# Patient Record
Sex: Female | Born: 1940
Health system: Southern US, Community
[De-identification: ages and names within clinical notes are randomized; demographics above are authoritative.]

## PROBLEM LIST (undated history)

## (undated) DIAGNOSIS — E039 Hypothyroidism, unspecified: Secondary | ICD-10-CM

## (undated) DIAGNOSIS — F32A Depression, unspecified: Secondary | ICD-10-CM

## (undated) DIAGNOSIS — Z8709 Personal history of other diseases of the respiratory system: Secondary | ICD-10-CM

## (undated) DIAGNOSIS — F329 Major depressive disorder, single episode, unspecified: Secondary | ICD-10-CM

## (undated) DIAGNOSIS — M2540 Effusion, unspecified joint: Secondary | ICD-10-CM

## (undated) DIAGNOSIS — Z8669 Personal history of other diseases of the nervous system and sense organs: Secondary | ICD-10-CM

## (undated) DIAGNOSIS — M2550 Pain in unspecified joint: Secondary | ICD-10-CM

## (undated) DIAGNOSIS — Z86718 Personal history of other venous thrombosis and embolism: Secondary | ICD-10-CM

## (undated) DIAGNOSIS — Z860100 Personal history of colon polyps, unspecified: Secondary | ICD-10-CM

## (undated) DIAGNOSIS — T7840XA Allergy, unspecified, initial encounter: Secondary | ICD-10-CM

## (undated) DIAGNOSIS — R112 Nausea with vomiting, unspecified: Secondary | ICD-10-CM

## (undated) DIAGNOSIS — M1990 Unspecified osteoarthritis, unspecified site: Secondary | ICD-10-CM

## (undated) DIAGNOSIS — T4145XA Adverse effect of unspecified anesthetic, initial encounter: Secondary | ICD-10-CM

## (undated) DIAGNOSIS — E559 Vitamin D deficiency, unspecified: Secondary | ICD-10-CM

## (undated) DIAGNOSIS — K5900 Constipation, unspecified: Secondary | ICD-10-CM

## (undated) DIAGNOSIS — K219 Gastro-esophageal reflux disease without esophagitis: Secondary | ICD-10-CM

## (undated) DIAGNOSIS — R32 Unspecified urinary incontinence: Secondary | ICD-10-CM

## (undated) DIAGNOSIS — T8859XA Other complications of anesthesia, initial encounter: Secondary | ICD-10-CM

## (undated) DIAGNOSIS — Z8489 Family history of other specified conditions: Secondary | ICD-10-CM

## (undated) DIAGNOSIS — I00 Rheumatic fever without heart involvement: Secondary | ICD-10-CM

## (undated) DIAGNOSIS — Z9889 Other specified postprocedural states: Secondary | ICD-10-CM

## (undated) HISTORY — DX: Personal history of colon polyps, unspecified: Z86.0100

## (undated) HISTORY — PX: OTHER SURGICAL HISTORY: SHX169

## (undated) HISTORY — DX: Personal history of colonic polyps: Z86.010

## (undated) HISTORY — DX: Rheumatic fever without heart involvement: I00

## (undated) HISTORY — DX: Hypothyroidism, unspecified: E03.9

## (undated) HISTORY — PX: APPENDECTOMY: SHX54

---
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
## 1977-08-04 DIAGNOSIS — Z86718 Personal history of other venous thrombosis and embolism: Secondary | ICD-10-CM

## 1977-08-04 HISTORY — DX: Personal history of other venous thrombosis and embolism: Z86.718

## 1980-08-04 HISTORY — PX: CHOLECYSTECTOMY: SHX55

## 1984-08-04 HISTORY — PX: BACK SURGERY: SHX140

## 1997-08-04 HISTORY — PX: KNEE SURGERY: SHX244

## 2001-08-02 ENCOUNTER — Encounter: Payer: Self-pay | Admitting: Family Medicine

## 2001-08-02 ENCOUNTER — Ambulatory Visit (HOSPITAL_COMMUNITY): Admission: RE | Admit: 2001-08-02 | Discharge: 2001-08-02 | Payer: Self-pay | Admitting: Family Medicine

## 2001-08-02 ENCOUNTER — Other Ambulatory Visit: Admission: RE | Admit: 2001-08-02 | Discharge: 2001-08-02 | Payer: Self-pay | Admitting: Family Medicine

## 2002-01-20 ENCOUNTER — Encounter: Payer: Self-pay | Admitting: Family Medicine

## 2002-01-20 ENCOUNTER — Ambulatory Visit (HOSPITAL_COMMUNITY): Admission: RE | Admit: 2002-01-20 | Discharge: 2002-01-20 | Payer: Self-pay | Admitting: Family Medicine

## 2002-02-02 ENCOUNTER — Ambulatory Visit (HOSPITAL_COMMUNITY): Admission: RE | Admit: 2002-02-02 | Discharge: 2002-02-02 | Payer: Self-pay | Admitting: Cardiology

## 2002-02-02 ENCOUNTER — Encounter: Payer: Self-pay | Admitting: Cardiovascular Disease

## 2002-02-02 ENCOUNTER — Ambulatory Visit (HOSPITAL_COMMUNITY): Admission: RE | Admit: 2002-02-02 | Discharge: 2002-02-02 | Payer: Self-pay | Admitting: Cardiovascular Disease

## 2002-03-06 ENCOUNTER — Ambulatory Visit (HOSPITAL_COMMUNITY): Admission: RE | Admit: 2002-03-06 | Discharge: 2002-03-06 | Payer: Self-pay | Admitting: Family Medicine

## 2002-03-06 ENCOUNTER — Encounter: Payer: Self-pay | Admitting: Family Medicine

## 2002-08-04 HISTORY — PX: COLONOSCOPY: SHX174

## 2002-08-09 ENCOUNTER — Ambulatory Visit (HOSPITAL_COMMUNITY): Admission: RE | Admit: 2002-08-09 | Discharge: 2002-08-09 | Payer: Self-pay | Admitting: Family Medicine

## 2002-08-09 ENCOUNTER — Encounter: Payer: Self-pay | Admitting: Family Medicine

## 2002-10-25 ENCOUNTER — Ambulatory Visit (HOSPITAL_COMMUNITY): Admission: RE | Admit: 2002-10-25 | Discharge: 2002-10-25 | Payer: Self-pay | Admitting: Internal Medicine

## 2003-08-14 ENCOUNTER — Ambulatory Visit (HOSPITAL_COMMUNITY): Admission: RE | Admit: 2003-08-14 | Discharge: 2003-08-14 | Payer: Self-pay | Admitting: Family Medicine

## 2006-11-18 ENCOUNTER — Ambulatory Visit (HOSPITAL_COMMUNITY): Admission: RE | Admit: 2006-11-18 | Discharge: 2006-11-18 | Payer: Self-pay | Admitting: Family Medicine

## 2008-05-18 ENCOUNTER — Ambulatory Visit (HOSPITAL_COMMUNITY): Admission: RE | Admit: 2008-05-18 | Discharge: 2008-05-18 | Payer: Self-pay | Admitting: Internal Medicine

## 2008-11-16 ENCOUNTER — Ambulatory Visit (HOSPITAL_COMMUNITY): Admission: RE | Admit: 2008-11-16 | Discharge: 2008-11-16 | Payer: Self-pay | Admitting: Internal Medicine

## 2008-11-23 ENCOUNTER — Ambulatory Visit (HOSPITAL_COMMUNITY): Admission: RE | Admit: 2008-11-23 | Discharge: 2008-11-23 | Payer: Self-pay | Admitting: Internal Medicine

## 2009-09-27 ENCOUNTER — Ambulatory Visit (HOSPITAL_COMMUNITY): Admission: RE | Admit: 2009-09-27 | Discharge: 2009-09-27 | Payer: Self-pay | Admitting: Orthopaedic Surgery

## 2009-10-02 HISTORY — PX: TOTAL KNEE ARTHROPLASTY: SHX125

## 2009-10-26 ENCOUNTER — Inpatient Hospital Stay (HOSPITAL_COMMUNITY): Admission: RE | Admit: 2009-10-26 | Discharge: 2009-10-30 | Payer: Self-pay | Admitting: Orthopaedic Surgery

## 2010-10-18 ENCOUNTER — Other Ambulatory Visit (HOSPITAL_COMMUNITY): Payer: Self-pay | Admitting: Internal Medicine

## 2010-10-18 DIAGNOSIS — Z139 Encounter for screening, unspecified: Secondary | ICD-10-CM

## 2010-10-24 ENCOUNTER — Ambulatory Visit (HOSPITAL_COMMUNITY)
Admission: RE | Admit: 2010-10-24 | Discharge: 2010-10-24 | Disposition: A | Discharge status: Home / Self Care | Payer: Medicare Other | Source: Ambulatory Visit | Attending: Internal Medicine | Admitting: Internal Medicine

## 2010-10-24 DIAGNOSIS — Z1231 Encounter for screening mammogram for malignant neoplasm of breast: Secondary | ICD-10-CM | POA: Insufficient documentation

## 2010-10-24 DIAGNOSIS — Z139 Encounter for screening, unspecified: Secondary | ICD-10-CM

## 2010-10-24 LAB — URINALYSIS, ROUTINE W REFLEX MICROSCOPIC
Bilirubin Urine: NEGATIVE
Glucose, UA: NEGATIVE mg/dL
Hgb urine dipstick: NEGATIVE
Ketones, ur: NEGATIVE mg/dL
Protein, ur: NEGATIVE mg/dL
Urobilinogen, UA: 0.2 mg/dL (ref 0.0–1.0)
pH: 5.5 (ref 5.0–8.0)

## 2010-10-24 LAB — CBC
MCHC: 32.8 g/dL (ref 30.0–36.0)
MCV: 97 fL (ref 78.0–100.0)
Platelets: 206 10*3/uL (ref 150–400)
RDW: 13.2 % (ref 11.5–15.5)

## 2010-10-24 LAB — PROTIME-INR
INR: 1.01 (ref 0.00–1.49)
Prothrombin Time: 13.2 seconds (ref 11.6–15.2)

## 2010-10-24 LAB — BASIC METABOLIC PANEL
CO2: 30 mEq/L (ref 19–32)
Creatinine, Ser: 0.93 mg/dL (ref 0.4–1.2)
GFR calc non Af Amer: 60 mL/min — ABNORMAL LOW (ref >60)
Potassium: 4.4 mEq/L (ref 3.5–5.1)
Sodium: 142 mEq/L (ref 135–145)

## 2010-10-28 LAB — CBC
Hemoglobin: 14.2 g/dL (ref 12.0–15.0)
Hemoglobin: 9.8 g/dL — ABNORMAL LOW (ref 12.0–15.0)
MCHC: 33.3 g/dL (ref 30.0–36.0)
MCHC: 33.6 g/dL (ref 30.0–36.0)
MCHC: 34.3 g/dL (ref 30.0–36.0)
MCHC: 34.7 g/dL (ref 30.0–36.0)
MCV: 96 fL (ref 78.0–100.0)
MCV: 96.4 fL (ref 78.0–100.0)
Platelets: 162 10*3/uL (ref 150–400)
Platelets: 173 10*3/uL (ref 150–400)
RBC: 2.94 MIL/uL — ABNORMAL LOW (ref 3.87–5.11)
RBC: 3.63 MIL/uL — ABNORMAL LOW (ref 3.87–5.11)
RDW: 12.6 % (ref 11.5–15.5)
RDW: 13 % (ref 11.5–15.5)
WBC: 7.6 10*3/uL (ref 4.0–10.5)
WBC: 8.4 10*3/uL (ref 4.0–10.5)
WBC: 8.8 10*3/uL (ref 4.0–10.5)

## 2010-10-28 LAB — BASIC METABOLIC PANEL
BUN: 15 mg/dL (ref 6–23)
BUN: 20 mg/dL (ref 6–23)
BUN: 23 mg/dL (ref 6–23)
CO2: 30 mEq/L (ref 19–32)
CO2: 30 mEq/L (ref 19–32)
CO2: 30 mEq/L (ref 19–32)
CO2: 32 mEq/L (ref 19–32)
Calcium: 8.5 mg/dL (ref 8.4–10.5)
Calcium: 8.7 mg/dL (ref 8.4–10.5)
Chloride: 100 mEq/L (ref 96–112)
Chloride: 104 mEq/L (ref 96–112)
Chloride: 95 mEq/L — ABNORMAL LOW (ref 96–112)
Creatinine, Ser: 0.87 mg/dL (ref 0.4–1.2)
Creatinine, Ser: 1.37 mg/dL — ABNORMAL HIGH (ref 0.4–1.2)
GFR calc Af Amer: 46 mL/min — ABNORMAL LOW (ref >60)
GFR calc Af Amer: >60 mL/min (ref >60)
GFR calc Af Amer: >60 mL/min (ref >60)
GFR calc non Af Amer: 38 mL/min — ABNORMAL LOW (ref >60)
GFR calc non Af Amer: >60 mL/min (ref >60)
Glucose, Bld: 136 mg/dL — ABNORMAL HIGH (ref 70–99)
Potassium: 3.3 mEq/L — ABNORMAL LOW (ref 3.5–5.1)
Potassium: 3.3 mEq/L — ABNORMAL LOW (ref 3.5–5.1)
Sodium: 134 mEq/L — ABNORMAL LOW (ref 135–145)
Sodium: 137 mEq/L (ref 135–145)
Sodium: 138 mEq/L (ref 135–145)

## 2010-10-28 LAB — PROTIME-INR
INR: 1.02 (ref 0.00–1.49)
INR: 1.46 (ref 0.00–1.49)
Prothrombin Time: 13.3 seconds (ref 11.6–15.2)
Prothrombin Time: 14.1 seconds (ref 11.6–15.2)
Prothrombin Time: 16.2 seconds — ABNORMAL HIGH (ref 11.6–15.2)

## 2010-10-28 LAB — URINALYSIS, ROUTINE W REFLEX MICROSCOPIC
Glucose, UA: NEGATIVE mg/dL
Nitrite: NEGATIVE
Urobilinogen, UA: 0.2 mg/dL (ref 0.0–1.0)

## 2010-12-20 NOTE — Procedures (Signed)
[facility]  Patient:    [name], [name] Visit Number: [id number] MRN: [id number]          Service Type: OUT Location: RAD Attending Physician:  Burnetta Sabin Dictated by:   Jacolyn Reedy, P.A.-C. Proc. Date: 02/02/02 Admit Date:  02/02/2002                                Stress Test  PROCEDURE:  Stress adenosine Cardiolite procedure.  PHYSICIAN:  Valera Castle, M.D.  DESCRIPTION OF PROCEDURE:  The baseline electrocardiogram revealed a normal sinus rhythm.  The patient had shortness of breath and tightness in her throat with the adenosine infusion.  The test was stopped due to completing the protocol, and her symptoms resolved quickly in recovery.  She had no electrocardiogram changes or arrhythmias.  The test was performed, because she atypical chest pain and a strong family history of coronary artery disease. Dictated by:   Jacolyn Reedy, P.A.-C. Attending Physician:  Burnetta Sabin DD:  02/02/02 TD:  02/04/02 Job: [id number]

## 2010-12-20 NOTE — Op Note (Signed)
[name], [name]                        ACCOUNT NO.:  [id number]   MEDICAL RECORD NO.:  [id number]                   PATIENT TYPE:  AMB   LOCATION:  DAY                                  FACILITY:  [facility]   PHYSICIAN:  [name], M.D.              DATE OF BIRTH:  16-Jun-1941   DATE OF PROCEDURE:  DATE OF DISCHARGE:                                 OPERATIVE REPORT   PROCEDURE:  Colonoscopy with snare polypectomy.   INDICATIONS FOR PROCEDURE:  The patient is a 70 year old lady referred here  by Dr. Butch Hansen for colorectal cancer screening.  She is devoid of any  lower GI tract symptoms and no family history of colorectal neoplasia.  She  has never had her colon imaged previously.  Colonoscopy is now being done as  a standard screening maneuver.  This approach has been discussed with the  patient at length.  The potential risks, benefits, and alternatives have  been reviewed and questions answered.  Please see my handwritten H&P for  more information.   PROCEDURE:  O2 saturation, blood pressure, pulses, and respirations were  monitored throughout the entire procedure.  Conscious sedation was with  Demerol 75 mg IV, Versed 3 mg IV in divided doses.  The instrument used was  the Olympus video chip adult colonoscope.   FINDINGS:  Digital rectal examination revealed no abnormalities.   ENDOSCOPIC FINDINGS:  The prep was good.   Rectum:  Examination of the rectal mucosa including retroflex view of the  anal verge revealed a 3.75-cm polyp and at 18 cm an adjacent 3-mm polyp.  No  other mucosal abnormalities were seen in the rectum.   Colon:  The colonic mucosa was surveyed from the rectosigmoid junction  through the left, transverse, right colon to the area of the appendiceal  orifice, ileocecal valve, and cecum.  These structures were well-seen and  photographed for the record.  The patient was noted to have left-sided  transverse diverticula.  The remainder of the  colonic mucosa to the cecum  appeared normal.  From the level of the cecum and appendiceal orifice, the  scope was slowly withdrawn.  All previously mentioned mucosal surfaces were  again seen, and no other abnormalities were observed.  The larger polyp in  the rectum was removed with snare cautery and recovered through the scope.  The smaller polyp was destroyed with the tip of the snare unit.  The patient  tolerated the procedure well and was reactive in endoscopy.   IMPRESSION:  1. Rectal polyps treated as described above.  The remainder of the rectal     mucosa appeared normal.  2. Left-sided transverse diverticula.  The remainder of the colonic mucosa     appeared normal.    RECOMMENDATIONS:  1. Diverticulosis literature provided to the patient.  2. No aspirin or arthritis medications for 10 days.  3. Further recommendations to follow pending  review of pathology report.                                               [name], M.D.    RMR/MEDQ  D:  10/25/2002  T:  10/25/2002  Job:  [id number]   cc:   [name], M.D.  [address]  [place]  [place] [address]  Fax: [phone]

## 2011-01-20 ENCOUNTER — Other Ambulatory Visit (INDEPENDENT_AMBULATORY_CARE_PROVIDER_SITE_OTHER): Payer: Medicare Other | Admitting: Internal Medicine

## 2011-01-20 ENCOUNTER — Encounter: Payer: Self-pay | Admitting: Gastroenterology

## 2011-01-20 ENCOUNTER — Ambulatory Visit (INDEPENDENT_AMBULATORY_CARE_PROVIDER_SITE_OTHER): Payer: Medicare Other | Admitting: Gastroenterology

## 2011-01-20 DIAGNOSIS — K649 Unspecified hemorrhoids: Secondary | ICD-10-CM | POA: Insufficient documentation

## 2011-01-20 DIAGNOSIS — K219 Gastro-esophageal reflux disease without esophagitis: Secondary | ICD-10-CM

## 2011-01-20 DIAGNOSIS — D126 Benign neoplasm of colon, unspecified: Secondary | ICD-10-CM

## 2011-01-20 DIAGNOSIS — K635 Polyp of colon: Secondary | ICD-10-CM | POA: Insufficient documentation

## 2011-01-20 MED ORDER — PEG 3350-KCL-NA BICARB-NACL 420 G PO SOLR: ORAL | Status: AC

## 2011-01-20 MED ORDER — HYDROCORTISONE ACETATE 25 MG RE SUPP: 25.0000 mg | Freq: Two times a day (BID) | RECTAL | Status: AC

## 2011-01-20 NOTE — Progress Notes (Signed)
Primary Care Physician:  Dwana Melena, MD  Primary Gastroenterologist:  Roetta Sessions, MD  Chief Complaint  Patient presents with  . Colonoscopy    HPI:  [name] is a 70 y.o. female here to schedule surveillance colonoscopy for history of colonic polyps. She also has chronic GERD and hemorrhoids. Since knee surgery, bothered with hemorrhoids. She got significantly constipated postoperatively. Bleed sometimes, itching and wipes. BM more regular now on stool softner every night. No heartburn on medication. No dyphagia. No abdominal pain. No weight loss.  Labs from February 2012, sodium 143, potassium 4.8, CO2 29, glucose 105, BUN 23, creatinine 0.9, total bilirubin 0.6, alkaline phosphatase 107, AST 18, ALT 19, albumin 4.6, calcium 9.9, TSH 3.408, free T4 1.3 to   Current Outpatient Prescriptions  Medication Sig Dispense Refill  . aspirin 81 MG tablet Take 81 mg by mouth daily.        . Cholecalciferol (VITAMIN D) 2000 UNITS CAPS Take by mouth.        . Cyanocobalamin (VITAMIN B-12) 2500 MCG SUBL Place under the tongue.        . diclofenac (VOLTAREN) 75 MG EC tablet       . DIOVAN HCT 160-12.5 MG per tablet       . escitalopram (LEXAPRO) 20 MG tablet Take 20 mg by mouth daily.        Marland Kitchen levothyroxine (SYNTHROID, LEVOTHROID) 25 MCG tablet       . olopatadine (PATANOL) 0.1 % ophthalmic solution 1 drop 2 (two) times daily.        Marland Kitchen omeprazole (PRILOSEC) 20 MG capsule       . simvastatin (ZOCOR) 40 MG tablet       . vitamin C (ASCORBIC ACID) 500 MG tablet Take 500 mg by mouth daily.        . hydrocortisone (ANUSOL-HC) 25 MG suppository Place 1 suppository (25 mg total) rectally every 12 (twelve) hours.  20 suppository  0  . polyethylene glycol-electrolytes (TRILYTE) 420 G solution Use as directed Also buy 1 fleet enema & 4 dulcolax tablets to use as directed  4000 mL  0    Allergies as of 01/20/2011  . (No Known Allergies)    Past Medical History  Diagnosis Date  . HTN  (hypertension)   . Anxiety   . Personal history of colonic polyps   . Hypothyroidism   . Hyperlipidemia   . Rheumatic fever 1958    hospitalized for three months    Past Surgical History  Procedure Date  . Cesarean section 1979  . Back surgery 1986  . Cholecystectomy 1982  . Knee surgery 1999    left  . Total knee arthroplasty 10/2009    left  . Roster shots 2007    ?  Marland Kitchen Colonoscopy 2004    Dr. Frankey Poot polyps, left sided diverticula. Path unavailable.   Marland Kitchen Appendectomy     incidental at time of C-section    Family History  Problem Relation Age of Onset  . Colon cancer Neg Hx   . Diabetes      siblings, mother  . Lung disease Father     black lung, died young  . Heart disease Brother     History   Social History  . Marital Status: Single    Spouse Name: N/A    Number of Children: 1  . Years of Education: N/A   Occupational History  .     Social History Main Topics  . Smoking status:  Never Smoker   . Smokeless tobacco: Not on file  . Alcohol Use: No  . Drug Use: No  . Sexually Active: Not on file   Other Topics Concern  . Not on file   Social History Narrative  . No narrative on file      ROS:  General: Negative for anorexia, weight loss, fever, chills, fatigue, weakness. Eyes: Negative for vision changes.  ENT: Negative for hoarseness, difficulty swallowing , nasal congestion. CV: Negative for chest pain, angina, palpitations, dyspnea on exertion, peripheral edema.  Respiratory: Negative for dyspnea at rest, dyspnea on exertion, cough, sputum, wheezing.  GI: See history of present illness. GU:  Negative for dysuria, hematuria, urinary incontinence, urinary frequency, nocturnal urination.  MS: Negative for joint pain, low back pain.  Derm: Negative for rash or itching.  Neuro: Negative for weakness, abnormal sensation, seizure, frequent headaches, memory loss, confusion.  Psych: Negative for anxiety, depression, suicidal ideation,  hallucinations.  Endo: Negative for unusual weight change.  Heme: Negative for bruising or bleeding. Allergy: Negative for rash or hives.    Physical Examination:  BP 120/76  Pulse 74  Temp(Src) 97.3 F (36.3 C) (Temporal)  Ht 5\' 8"  (1.727 m)  Wt 255 lb 3.2 oz (115.758 kg)  BMI 38.80 kg/m2   General: Well-nourished, well-developed in no acute distress.  Head: Normocephalic, atraumatic.   Eyes: Conjunctiva pink, no icterus. Mouth: Oropharyngeal mucosa moist and pink , no lesions erythema or exudate. Neck: Supple without thyromegaly, masses, or lymphadenopathy.  Lungs: Clear to auscultation bilaterally.  Heart: Regular rate and rhythm, no murmurs rubs or gallops.  Abdomen: Bowel sounds are normal, nontender, nondistended, no hepatosplenomegaly or masses, no abdominal bruits or    hernia , no rebound or guarding.   Extremities: No lower extremity edema.  Neuro: Alert and oriented x 4 , grossly normal neurologically.  Skin: Warm and dry, no rash or jaundice.   Psych: Alert and cooperative, normal mood and affect.

## 2011-01-21 ENCOUNTER — Encounter: Payer: Self-pay | Admitting: Gastroenterology

## 2011-01-21 NOTE — Progress Notes (Signed)
Cc to PCP 

## 2011-01-21 NOTE — Assessment & Plan Note (Signed)
Chronic GERD. Symptoms controlled on PPI. No prior endoscopy. Recommend one time EGD to rule out Barrett's. I have discussed the risks, alternatives, benefits with regards to but not limited to the risk of reaction to medication, bleeding, infection, perforation and the patient is agreeable to proceed. Written consent to be obtained.

## 2011-01-21 NOTE — Assessment & Plan Note (Signed)
History of colonic polyps, pathology unavailable at this time. She states she was supposed to come back in 5 years for followup. Colonoscopy for surveillance.  I have discussed the risks, alternatives, benefits with regards to but not limited to the risk of reaction to medication, bleeding, infection, perforation and the patient is agreeable to proceed. Written consent to be obtained.

## 2011-02-11 MED ORDER — SODIUM CHLORIDE 0.45 % IV SOLN: Freq: Once | INTRAVENOUS | Status: DC

## 2011-02-12 ENCOUNTER — Other Ambulatory Visit: Payer: Self-pay | Admitting: Internal Medicine

## 2011-02-12 ENCOUNTER — Encounter: Payer: Medicare Other | Admitting: Internal Medicine

## 2011-02-12 ENCOUNTER — Encounter (HOSPITAL_COMMUNITY): Payer: Self-pay | Admitting: *Deleted

## 2011-02-12 ENCOUNTER — Encounter (HOSPITAL_COMMUNITY)
Admission: RE | Disposition: A | Discharge status: Home / Self Care | Payer: Self-pay | Source: Ambulatory Visit | Attending: Internal Medicine

## 2011-02-12 ENCOUNTER — Ambulatory Visit (HOSPITAL_COMMUNITY)
Admission: RE | Admit: 2011-02-12 | Discharge: 2011-02-12 | Disposition: A | Discharge status: Home / Self Care | Payer: Medicare Other | Source: Ambulatory Visit | Attending: Internal Medicine | Admitting: Internal Medicine

## 2011-02-12 DIAGNOSIS — K5730 Diverticulosis of large intestine without perforation or abscess without bleeding: Secondary | ICD-10-CM | POA: Insufficient documentation

## 2011-02-12 DIAGNOSIS — I10 Essential (primary) hypertension: Secondary | ICD-10-CM | POA: Insufficient documentation

## 2011-02-12 DIAGNOSIS — Z860100 Personal history of colon polyps, unspecified: Secondary | ICD-10-CM | POA: Insufficient documentation

## 2011-02-12 DIAGNOSIS — R933 Abnormal findings on diagnostic imaging of other parts of digestive tract: Secondary | ICD-10-CM

## 2011-02-12 DIAGNOSIS — Z800 Family history of malignant neoplasm of digestive organs: Secondary | ICD-10-CM | POA: Insufficient documentation

## 2011-02-12 DIAGNOSIS — K219 Gastro-esophageal reflux disease without esophagitis: Secondary | ICD-10-CM | POA: Insufficient documentation

## 2011-02-12 DIAGNOSIS — Z79899 Other long term (current) drug therapy: Secondary | ICD-10-CM | POA: Insufficient documentation

## 2011-02-12 DIAGNOSIS — Z7982 Long term (current) use of aspirin: Secondary | ICD-10-CM | POA: Insufficient documentation

## 2011-02-12 DIAGNOSIS — E785 Hyperlipidemia, unspecified: Secondary | ICD-10-CM | POA: Insufficient documentation

## 2011-02-12 HISTORY — PX: ESOPHAGOGASTRODUODENOSCOPY: SHX5428

## 2011-02-12 HISTORY — DX: Gastro-esophageal reflux disease without esophagitis: K21.9

## 2011-02-12 HISTORY — PX: COLONOSCOPY: SHX5424

## 2011-02-12 SURGERY — EGD (ESOPHAGOGASTRODUODENOSCOPY)
Anesthesia: Moderate Sedation

## 2011-02-12 MED ORDER — BUTAMBEN-TETRACAINE-BENZOCAINE 2-2-14 % EX AERO
INHALATION_SPRAY | CUTANEOUS | Status: DC | PRN
  Administered 2011-02-12: 2 via TOPICAL

## 2011-02-12 MED ORDER — MEPERIDINE HCL 25 MG/ML IJ SOLN
INTRAMUSCULAR | Status: DC | PRN
  Administered 2011-02-12: 50 mg via INTRAVENOUS
  Administered 2011-02-12: 25 mg via INTRAVENOUS

## 2011-02-12 MED ORDER — MIDAZOLAM HCL 5 MG/5ML IJ SOLN
INTRAMUSCULAR | Status: DC | PRN
  Administered 2011-02-12 (×2): 1 mg via INTRAVENOUS
  Administered 2011-02-12: 2 mg via INTRAVENOUS

## 2011-02-12 MED ORDER — MIDAZOLAM HCL 5 MG/5ML IJ SOLN
INTRAMUSCULAR | Status: AC
  Filled 2011-02-12: qty 10

## 2011-02-12 MED ORDER — MEPERIDINE HCL 100 MG/ML IJ SOLN
INTRAMUSCULAR | Status: AC
  Filled 2011-02-12: qty 2

## 2011-02-13 ENCOUNTER — Encounter (HOSPITAL_COMMUNITY): Payer: Self-pay

## 2011-02-24 ENCOUNTER — Encounter (HOSPITAL_COMMUNITY): Payer: Self-pay | Admitting: Internal Medicine

## 2011-09-24 DIAGNOSIS — M19049 Primary osteoarthritis, unspecified hand: Secondary | ICD-10-CM | POA: Diagnosis not present

## 2011-12-12 DIAGNOSIS — J209 Acute bronchitis, unspecified: Secondary | ICD-10-CM | POA: Diagnosis not present

## 2011-12-23 DIAGNOSIS — J209 Acute bronchitis, unspecified: Secondary | ICD-10-CM | POA: Diagnosis not present

## 2011-12-24 ENCOUNTER — Other Ambulatory Visit: Payer: Self-pay

## 2011-12-24 DIAGNOSIS — J209 Acute bronchitis, unspecified: Secondary | ICD-10-CM

## 2012-01-02 ENCOUNTER — Ambulatory Visit (HOSPITAL_COMMUNITY)
Admission: RE | Admit: 2012-01-02 | Discharge: 2012-01-02 | Disposition: A | Discharge status: Home / Self Care | Payer: Medicare Other | Source: Ambulatory Visit | Attending: Internal Medicine | Admitting: Internal Medicine

## 2012-01-02 DIAGNOSIS — J209 Acute bronchitis, unspecified: Secondary | ICD-10-CM | POA: Insufficient documentation

## 2012-01-02 DIAGNOSIS — R0602 Shortness of breath: Secondary | ICD-10-CM | POA: Insufficient documentation

## 2012-01-02 MED ORDER — ALBUTEROL SULFATE (5 MG/ML) 0.5% IN NEBU
2.5000 mg | INHALATION_SOLUTION | Freq: Once | RESPIRATORY_TRACT | Status: AC
  Administered 2012-01-02: 2.5 mg via RESPIRATORY_TRACT

## 2012-01-05 NOTE — Procedures (Signed)
NAMENIYLA, MARONE              ACCOUNT NO.:  [id number]  MEDICAL RECORD NO.:  [id number]  LOCATION:  RESP                          FACILITY:  [facility]  PHYSICIAN:  Madisun Hargrove L. Juanetta Gosling, M.D.DATE OF BIRTH:  May 09, 1941  DATE OF PROCEDURE: DATE OF DISCHARGE:  01/02/2012                           PULMONARY FUNCTION TEST   Reason for pulmonary function testing is acute bronchitis. 1. Spirometry shows a mild ventilatory defect without definite airflow     obstruction. 2. Lung volumes are normal. 3. DLCO is mildly reduced, but does correct when volume is considered. 4. Airway resistance is normal. 5. No significant bronchodilator improvement. 6. Although, this is consistent with acute bronchitis, there is no     evidence of airflow obstruction, COPD, etc.     Archie Shea L. Juanetta Gosling, M.D.     ELH/MEDQ  D:  01/05/2012  T:  01/05/2012  Job:  [id number]  cc:   Catalina Pizza, M.D. Fax: [phone]

## 2012-01-09 ENCOUNTER — Other Ambulatory Visit (HOSPITAL_COMMUNITY): Payer: Self-pay | Admitting: Internal Medicine

## 2012-01-09 DIAGNOSIS — E785 Hyperlipidemia, unspecified: Secondary | ICD-10-CM | POA: Diagnosis not present

## 2012-01-09 DIAGNOSIS — Z139 Encounter for screening, unspecified: Secondary | ICD-10-CM

## 2012-01-09 DIAGNOSIS — R5383 Other fatigue: Secondary | ICD-10-CM | POA: Diagnosis not present

## 2012-01-09 DIAGNOSIS — R5381 Other malaise: Secondary | ICD-10-CM | POA: Diagnosis not present

## 2012-01-09 DIAGNOSIS — I10 Essential (primary) hypertension: Secondary | ICD-10-CM | POA: Diagnosis not present

## 2012-01-09 DIAGNOSIS — E559 Vitamin D deficiency, unspecified: Secondary | ICD-10-CM | POA: Diagnosis not present

## 2012-01-09 LAB — PULMONARY FUNCTION TEST

## 2012-01-12 ENCOUNTER — Ambulatory Visit (HOSPITAL_COMMUNITY)
Admission: RE | Admit: 2012-01-12 | Discharge: 2012-01-12 | Disposition: A | Discharge status: Home / Self Care | Payer: Medicare Other | Source: Ambulatory Visit | Attending: Internal Medicine | Admitting: Internal Medicine

## 2012-01-12 DIAGNOSIS — Z1231 Encounter for screening mammogram for malignant neoplasm of breast: Secondary | ICD-10-CM | POA: Diagnosis not present

## 2012-01-12 DIAGNOSIS — Z139 Encounter for screening, unspecified: Secondary | ICD-10-CM

## 2012-02-27 DIAGNOSIS — E039 Hypothyroidism, unspecified: Secondary | ICD-10-CM | POA: Diagnosis not present

## 2012-04-20 DIAGNOSIS — E785 Hyperlipidemia, unspecified: Secondary | ICD-10-CM | POA: Diagnosis not present

## 2012-04-20 DIAGNOSIS — E669 Obesity, unspecified: Secondary | ICD-10-CM | POA: Diagnosis not present

## 2012-04-20 DIAGNOSIS — E039 Hypothyroidism, unspecified: Secondary | ICD-10-CM | POA: Diagnosis not present

## 2012-04-20 DIAGNOSIS — I10 Essential (primary) hypertension: Secondary | ICD-10-CM | POA: Diagnosis not present

## 2012-04-28 DIAGNOSIS — J210 Acute bronchiolitis due to respiratory syncytial virus: Secondary | ICD-10-CM | POA: Diagnosis not present

## 2012-04-28 DIAGNOSIS — J392 Other diseases of pharynx: Secondary | ICD-10-CM | POA: Diagnosis not present

## 2012-04-28 DIAGNOSIS — J069 Acute upper respiratory infection, unspecified: Secondary | ICD-10-CM | POA: Diagnosis not present

## 2012-05-03 DIAGNOSIS — D049 Carcinoma in situ of skin, unspecified: Secondary | ICD-10-CM | POA: Diagnosis not present

## 2012-05-03 DIAGNOSIS — L989 Disorder of the skin and subcutaneous tissue, unspecified: Secondary | ICD-10-CM | POA: Diagnosis not present

## 2012-06-09 DIAGNOSIS — M25569 Pain in unspecified knee: Secondary | ICD-10-CM | POA: Diagnosis not present

## 2012-06-09 DIAGNOSIS — M25559 Pain in unspecified hip: Secondary | ICD-10-CM | POA: Diagnosis not present

## 2012-06-23 DIAGNOSIS — H43399 Other vitreous opacities, unspecified eye: Secondary | ICD-10-CM | POA: Diagnosis not present

## 2012-06-23 DIAGNOSIS — H2589 Other age-related cataract: Secondary | ICD-10-CM | POA: Diagnosis not present

## 2012-06-23 DIAGNOSIS — H35039 Hypertensive retinopathy, unspecified eye: Secondary | ICD-10-CM | POA: Diagnosis not present

## 2012-06-23 DIAGNOSIS — H5200 Hypermetropia, unspecified eye: Secondary | ICD-10-CM | POA: Diagnosis not present

## 2012-07-23 DIAGNOSIS — J069 Acute upper respiratory infection, unspecified: Secondary | ICD-10-CM | POA: Diagnosis not present

## 2012-08-24 DIAGNOSIS — E559 Vitamin D deficiency, unspecified: Secondary | ICD-10-CM | POA: Diagnosis not present

## 2012-08-24 DIAGNOSIS — I10 Essential (primary) hypertension: Secondary | ICD-10-CM | POA: Diagnosis not present

## 2012-08-24 DIAGNOSIS — N318 Other neuromuscular dysfunction of bladder: Secondary | ICD-10-CM | POA: Diagnosis not present

## 2012-08-24 DIAGNOSIS — E785 Hyperlipidemia, unspecified: Secondary | ICD-10-CM | POA: Diagnosis not present

## 2012-09-21 DIAGNOSIS — Z124 Encounter for screening for malignant neoplasm of cervix: Secondary | ICD-10-CM | POA: Diagnosis not present

## 2012-09-21 DIAGNOSIS — N390 Urinary tract infection, site not specified: Secondary | ICD-10-CM | POA: Diagnosis not present

## 2012-09-21 DIAGNOSIS — N3945 Continuous leakage: Secondary | ICD-10-CM | POA: Diagnosis not present

## 2012-10-12 DIAGNOSIS — N393 Stress incontinence (female) (male): Secondary | ICD-10-CM | POA: Diagnosis not present

## 2012-10-12 DIAGNOSIS — R350 Frequency of micturition: Secondary | ICD-10-CM | POA: Diagnosis not present

## 2012-10-13 DIAGNOSIS — M1710 Unilateral primary osteoarthritis, unspecified knee: Secondary | ICD-10-CM | POA: Diagnosis not present

## 2012-10-13 DIAGNOSIS — M5412 Radiculopathy, cervical region: Secondary | ICD-10-CM | POA: Diagnosis not present

## 2012-11-01 DIAGNOSIS — J069 Acute upper respiratory infection, unspecified: Secondary | ICD-10-CM | POA: Diagnosis not present

## 2012-11-10 DIAGNOSIS — N3945 Continuous leakage: Secondary | ICD-10-CM | POA: Diagnosis not present

## 2012-12-23 DIAGNOSIS — N318 Other neuromuscular dysfunction of bladder: Secondary | ICD-10-CM | POA: Diagnosis not present

## 2012-12-23 DIAGNOSIS — N393 Stress incontinence (female) (male): Secondary | ICD-10-CM | POA: Diagnosis not present

## 2013-02-24 DIAGNOSIS — E669 Obesity, unspecified: Secondary | ICD-10-CM | POA: Diagnosis not present

## 2013-02-24 DIAGNOSIS — E785 Hyperlipidemia, unspecified: Secondary | ICD-10-CM | POA: Diagnosis not present

## 2013-02-24 DIAGNOSIS — I10 Essential (primary) hypertension: Secondary | ICD-10-CM | POA: Diagnosis not present

## 2013-02-24 DIAGNOSIS — F411 Generalized anxiety disorder: Secondary | ICD-10-CM | POA: Diagnosis not present

## 2013-02-24 DIAGNOSIS — K219 Gastro-esophageal reflux disease without esophagitis: Secondary | ICD-10-CM | POA: Diagnosis not present

## 2013-04-21 DIAGNOSIS — E669 Obesity, unspecified: Secondary | ICD-10-CM | POA: Diagnosis not present

## 2013-04-21 DIAGNOSIS — I10 Essential (primary) hypertension: Secondary | ICD-10-CM | POA: Diagnosis not present

## 2013-04-21 DIAGNOSIS — F329 Major depressive disorder, single episode, unspecified: Secondary | ICD-10-CM | POA: Diagnosis not present

## 2013-05-31 DIAGNOSIS — Z23 Encounter for immunization: Secondary | ICD-10-CM | POA: Diagnosis not present

## 2013-05-31 DIAGNOSIS — H9319 Tinnitus, unspecified ear: Secondary | ICD-10-CM | POA: Diagnosis not present

## 2013-06-01 DIAGNOSIS — H9319 Tinnitus, unspecified ear: Secondary | ICD-10-CM | POA: Diagnosis not present

## 2013-06-01 DIAGNOSIS — Z23 Encounter for immunization: Secondary | ICD-10-CM | POA: Diagnosis not present

## 2013-06-05 ENCOUNTER — Emergency Department (HOSPITAL_COMMUNITY): Payer: Medicare Other

## 2013-06-05 ENCOUNTER — Emergency Department (HOSPITAL_COMMUNITY)
Admission: EM | Admit: 2013-06-05 | Discharge: 2013-06-06 | Disposition: A | Discharge status: Home / Self Care | Payer: Medicare Other | Attending: Emergency Medicine | Admitting: Emergency Medicine

## 2013-06-05 ENCOUNTER — Encounter (HOSPITAL_COMMUNITY): Payer: Self-pay | Admitting: Emergency Medicine

## 2013-06-05 DIAGNOSIS — R350 Frequency of micturition: Secondary | ICD-10-CM | POA: Diagnosis not present

## 2013-06-05 DIAGNOSIS — R110 Nausea: Secondary | ICD-10-CM | POA: Diagnosis not present

## 2013-06-05 DIAGNOSIS — Z7982 Long term (current) use of aspirin: Secondary | ICD-10-CM | POA: Diagnosis not present

## 2013-06-05 DIAGNOSIS — I10 Essential (primary) hypertension: Secondary | ICD-10-CM | POA: Diagnosis not present

## 2013-06-05 DIAGNOSIS — R109 Unspecified abdominal pain: Secondary | ICD-10-CM | POA: Diagnosis not present

## 2013-06-05 DIAGNOSIS — R509 Fever, unspecified: Secondary | ICD-10-CM | POA: Insufficient documentation

## 2013-06-05 DIAGNOSIS — N390 Urinary tract infection, site not specified: Secondary | ICD-10-CM | POA: Diagnosis not present

## 2013-06-05 DIAGNOSIS — E039 Hypothyroidism, unspecified: Secondary | ICD-10-CM | POA: Insufficient documentation

## 2013-06-05 DIAGNOSIS — E785 Hyperlipidemia, unspecified: Secondary | ICD-10-CM | POA: Insufficient documentation

## 2013-06-05 DIAGNOSIS — Z79899 Other long term (current) drug therapy: Secondary | ICD-10-CM | POA: Insufficient documentation

## 2013-06-05 DIAGNOSIS — K219 Gastro-esophageal reflux disease without esophagitis: Secondary | ICD-10-CM | POA: Diagnosis not present

## 2013-06-05 DIAGNOSIS — R000 Tachycardia, unspecified: Secondary | ICD-10-CM | POA: Diagnosis not present

## 2013-06-05 DIAGNOSIS — F3289 Other specified depressive episodes: Secondary | ICD-10-CM | POA: Insufficient documentation

## 2013-06-05 DIAGNOSIS — R3915 Urgency of urination: Secondary | ICD-10-CM | POA: Diagnosis not present

## 2013-06-05 DIAGNOSIS — F329 Major depressive disorder, single episode, unspecified: Secondary | ICD-10-CM | POA: Insufficient documentation

## 2013-06-05 DIAGNOSIS — R1031 Right lower quadrant pain: Secondary | ICD-10-CM | POA: Diagnosis not present

## 2013-06-05 LAB — COMPREHENSIVE METABOLIC PANEL
ALT: 14 U/L (ref 0–35)
Albumin: 4 g/dL (ref 3.5–5.2)
Alkaline Phosphatase: 111 U/L (ref 39–117)
BUN: 16 mg/dL (ref 6–23)
Chloride: 96 mEq/L (ref 96–112)
Creatinine, Ser: 1.06 mg/dL (ref 0.50–1.10)
GFR calc Af Amer: 60 mL/min — ABNORMAL LOW (ref >90)
GFR calc non Af Amer: 52 mL/min — ABNORMAL LOW (ref >90)
Glucose, Bld: 137 mg/dL — ABNORMAL HIGH (ref 70–99)
Potassium: 3.7 mEq/L (ref 3.5–5.1)
Total Bilirubin: 0.7 mg/dL (ref 0.3–1.2)
Total Protein: 7.7 g/dL (ref 6.0–8.3)

## 2013-06-05 LAB — URINE MICROSCOPIC-ADD ON

## 2013-06-05 LAB — CBC WITH DIFFERENTIAL/PLATELET
Eosinophils Relative: 0 % (ref 0–5)
HCT: 44.2 % (ref 36.0–46.0)
Hemoglobin: 15 g/dL (ref 12.0–15.0)
Lymphocytes Relative: 9 % — ABNORMAL LOW (ref 12–46)
Lymphs Abs: 1.3 10*3/uL (ref 0.7–4.0)
MCV: 94.2 fL (ref 78.0–100.0)
Monocytes Absolute: 1.2 10*3/uL — ABNORMAL HIGH (ref 0.1–1.0)
Monocytes Relative: 9 % (ref 3–12)
Neutro Abs: 11.9 10*3/uL — ABNORMAL HIGH (ref 1.7–7.7)
RDW: 13.7 % (ref 11.5–15.5)
WBC: 14.5 10*3/uL — ABNORMAL HIGH (ref 4.0–10.5)

## 2013-06-05 LAB — URINALYSIS, ROUTINE W REFLEX MICROSCOPIC
Glucose, UA: NEGATIVE mg/dL
Ketones, ur: NEGATIVE mg/dL
Protein, ur: >300 mg/dL — AB
pH: 6.5 (ref 5.0–8.0)

## 2013-06-05 LAB — CG4 I-STAT (LACTIC ACID): Lactic Acid, Venous: 1.42 mmol/L (ref 0.5–2.2)

## 2013-06-05 MED ORDER — MORPHINE SULFATE 4 MG/ML IJ SOLN
4.0000 mg | Freq: Once | INTRAMUSCULAR | Status: AC
  Administered 2013-06-05: 4 mg via INTRAVENOUS
  Filled 2013-06-05: qty 1

## 2013-06-05 MED ORDER — SODIUM CHLORIDE 0.9 % IV SOLN: 1000.0000 mL | INTRAVENOUS | Status: DC

## 2013-06-05 MED ORDER — DEXTROSE 5 % IV SOLN
1.0000 g | Freq: Once | INTRAVENOUS | Status: AC
  Administered 2013-06-05: 1 g via INTRAVENOUS
  Filled 2013-06-05 (×2): qty 10

## 2013-06-05 MED ORDER — SODIUM CHLORIDE 0.9 % IV SOLN: 1000.0000 mL | Freq: Once | INTRAVENOUS | Status: DC

## 2013-06-05 MED ORDER — SODIUM CHLORIDE 0.9 % IV SOLN
1000.0000 mL | Freq: Once | INTRAVENOUS | Status: AC
  Administered 2013-06-05: 1000 mL via INTRAVENOUS

## 2013-06-05 MED ORDER — ONDANSETRON HCL 4 MG/2ML IJ SOLN
4.0000 mg | Freq: Once | INTRAMUSCULAR | Status: AC
  Administered 2013-06-05: 4 mg via INTRAVENOUS
  Filled 2013-06-05: qty 2

## 2013-06-05 MED ORDER — IOHEXOL 300 MG/ML  SOLN
100.0000 mL | Freq: Once | INTRAMUSCULAR | Status: AC | PRN
  Administered 2013-06-05: 100 mL via INTRAVENOUS

## 2013-06-05 MED ORDER — IOHEXOL 300 MG/ML  SOLN: 50.0000 mL | Freq: Once | INTRAMUSCULAR | Status: DC | PRN

## 2013-06-05 MED ORDER — ACETAMINOPHEN 325 MG PO TABS
650.0000 mg | ORAL_TABLET | Freq: Once | ORAL | Status: AC
  Administered 2013-06-05: 650 mg via ORAL
  Filled 2013-06-05: qty 2

## 2013-06-05 NOTE — ED Notes (Signed)
Pt states she has been having pain in her lower abdomen/pelvic area since Thursday  Pt states she is having pressure, dysuria, frequency, and only able to go a little at a time  Pt states she has noticed blood in her urine intermittently

## 2013-06-05 NOTE — ED Provider Notes (Signed)
CSN: [id number]     Arrival date & time 06/05/13  1924 History   First MD Initiated Contact with Patient 06/05/13 2034     Chief Complaint  Patient presents with  . Urinary Tract Infection   (Consider location/radiation/quality/duration/timing/severity/associated sxs/prior Treatment) Patient is a 72 y.o. female presenting with urinary tract infection. The history is provided by the patient, medical records and a relative. No language interpreter was used.  Urinary Tract Infection Associated symptoms include abdominal pain and nausea. Pertinent negatives include no chest pain, coughing, diaphoresis, fatigue, fever, neck pain, rash, vomiting or weakness.    [name] is a 72 y.o. female  with a hx of hypertension, anxiety, hypothyroid, hyperlipidemia, GERD presents to the Emergency Department complaining of gradual, persistent, progressively worsening right lower quadrant abdominal pain onset 4 days ago. Associated symptoms include dysuria, urgency and frequency. Patient reports when she urinates only a very small amount comes out.  She also reports fever of 101 at home today. Nothing makes it better and nothing makes it worse. Patient with associated nausea and fatigue. Pt denies headache and neck pain, chest pain, shortness of breath, cough, vomiting, diarrhea, weakness, dizziness, syncope, dysuria.     Past Medical History  Diagnosis Date  . HTN (hypertension)   . Anxiety   . Personal history of colonic polyps   . Hypothyroidism   . Hyperlipidemia   . Rheumatic fever 1958    hospitalized for three months  . GERD (gastroesophageal reflux disease)    Past Surgical History  Procedure Laterality Date  . Cesarean section  1979  . Back surgery  1986  . Cholecystectomy  1982  . Knee surgery  1999    left  . Total knee arthroplasty  10/2009    left  . Roster shots  2007    ?  Marland Kitchen Colonoscopy  2004    Dr. Frankey Poot polyps, left sided diverticula. Path unavailable.   Marland Kitchen  Appendectomy      incidental at time of C-section  . Esophagogastroduodenoscopy  02/12/2011    Procedure: ESOPHAGOGASTRODUODENOSCOPY (EGD);  Surgeon: Corbin Ade, MD;  Location: [facility];  Service: Endoscopy;  Laterality: N/A;  . Colonoscopy  02/12/2011    Procedure: COLONOSCOPY;  Surgeon: Corbin Ade, MD;  Location: [facility];  Service: Endoscopy;  Laterality: N/A;   Family History  Problem Relation Age of Onset  . Colon cancer Neg Hx   . Diabetes      siblings, mother  . Lung disease Father     black lung, died young  . Heart disease Brother    History  Substance Use Topics  . Smoking status: Never Smoker   . Smokeless tobacco: Not on file  . Alcohol Use: No   OB History   Grav Para Term Preterm Abortions TAB SAB Ect Mult Living                 Review of Systems  Constitutional: Negative for fever, diaphoresis, appetite change, fatigue and unexpected weight change.  HENT: Negative for mouth sores and trouble swallowing.   Respiratory: Negative for cough, chest tightness, shortness of breath, wheezing and stridor.   Cardiovascular: Negative for chest pain and palpitations.  Gastrointestinal: Positive for nausea and abdominal pain. Negative for vomiting, diarrhea, constipation, blood in stool, abdominal distention and rectal pain.  Genitourinary: Positive for dysuria, urgency and difficulty urinating. Negative for frequency, hematuria and flank pain.  Musculoskeletal: Negative for back pain, neck pain and neck stiffness.  Skin: Negative for rash.  Neurological: Negative for weakness.  Hematological: Negative for adenopathy.  Psychiatric/Behavioral: Negative for confusion.  All other systems reviewed and are negative.    Allergies  Review of patient's allergies indicates no known allergies.  Home Medications   Current Outpatient Rx  Name  Route  Sig  Dispense  Refill  . aspirin 81 MG tablet   Oral   Take 81 mg by mouth daily.           .  Cholecalciferol (VITAMIN D) 2000 UNITS CAPS   Oral   Take 1 capsule by mouth daily.          . Cyanocobalamin (VITAMIN B-12) 2500 MCG SUBL   Sublingual   Place 2,500 mcg under the tongue daily.          Marland Kitchen DIOVAN HCT 160-12.5 MG per tablet               . docusate sodium (COLACE) 100 MG capsule   Oral   Take 100 mg by mouth daily.         Marland Kitchen escitalopram (LEXAPRO) 20 MG tablet   Oral   Take 20 mg by mouth daily.           Marland Kitchen levothyroxine (SYNTHROID, LEVOTHROID) 25 MCG tablet   Oral   Take 25 mcg by mouth daily before breakfast.          . Multiple Vitamin (MULTIVITAMIN WITH MINERALS) TABS tablet   Oral   Take 1 tablet by mouth daily.         Marland Kitchen omeprazole (PRILOSEC) 20 MG capsule   Oral   Take 20 mg by mouth daily.          Marland Kitchen oxybutynin (DITROPAN) 5 MG tablet   Oral   Take 2.5 mg by mouth 2 (two) times daily.          . phentermine (ADIPEX-P) 37.5 MG tablet   Oral   Take 37.5 mg by mouth daily.         . simvastatin (ZOCOR) 40 MG tablet   Oral   Take 40 mg by mouth at bedtime.          . vitamin C (ASCORBIC ACID) 500 MG tablet   Oral   Take 500 mg by mouth daily.            BP 101/69  Pulse 105  Temp(Src) 101.4 F (38.6 C) (Rectal)  Resp 21  Ht 5\' 9"  (1.753 m)  Wt 260 lb (117.935 kg)  BMI 38.38 kg/m2  SpO2 96% Physical Exam  Nursing note and vitals reviewed. Constitutional: She is oriented to person, place, and time. She appears well-developed and well-nourished. She appears distressed.  Awake, alert, uncomfortable appearing  HENT:  Head: Normocephalic and atraumatic.  Mouth/Throat: Oropharynx is clear and moist. No oropharyngeal exudate.  Eyes: Conjunctivae are normal. No scleral icterus.  Neck: Normal range of motion. Neck supple.  Cardiovascular: Regular rhythm, S1 normal, S2 normal, normal heart sounds and intact distal pulses.  Tachycardia present.   No murmur heard. Pulses:      Radial pulses are 2+ on the right side,  and 2+ on the left side.       Dorsalis pedis pulses are 2+ on the right side, and 2+ on the left side.       Posterior tibial pulses are 2+ on the right side, and 2+ on the left side.  Tachycardia Capillary refill less than 3 seconds  Pulmonary/Chest: Effort normal and breath sounds normal. No respiratory distress. She has no wheezes.  Abdominal: Soft. Bowel sounds are normal. She exhibits no distension and no mass. There is tenderness. There is guarding. There is no rebound and no CVA tenderness.    Musculoskeletal: Normal range of motion. She exhibits no edema.  Neurological: She is alert and oriented to person, place, and time. Coordination normal.  Speech is clear and goal oriented Moves extremities without ataxia  Skin: Skin is warm and dry. She is not diaphoretic. No erythema.  Psychiatric: She has a normal mood and affect.    ED Course  Procedures (including critical care time) Labs Review Labs Reviewed  CBC WITH DIFFERENTIAL - Abnormal; Notable for the following:    WBC 14.5 (*)    Neutrophils Relative % 82 (*)    Neutro Abs 11.9 (*)    Lymphocytes Relative 9 (*)    Monocytes Absolute 1.2 (*)    All other components within normal limits  COMPREHENSIVE METABOLIC PANEL - Abnormal; Notable for the following:    Sodium 132 (*)    Glucose, Bld 137 (*)    GFR calc non Af Amer 52 (*)    GFR calc Af Amer 60 (*)    All other components within normal limits  URINALYSIS, ROUTINE W REFLEX MICROSCOPIC - Abnormal; Notable for the following:    APPearance TURBID (*)    Hgb urine dipstick LARGE (*)    Bilirubin Urine SMALL (*)    Protein, ur >300 (*)    Nitrite POSITIVE (*)    Leukocytes, UA LARGE (*)    All other components within normal limits  URINE MICROSCOPIC-ADD ON - Abnormal; Notable for the following:    Bacteria, UA MANY (*)    All other components within normal limits  CULTURE, BLOOD (ROUTINE X 2)  CULTURE, BLOOD (ROUTINE X 2)  URINE CULTURE  CG4 I-STAT (LACTIC  ACID)   Imaging Review Ct Abdomen Pelvis W Contrast  06/05/2013   CLINICAL DATA:  72 year old female with lower abdominal and pelvic pain since Thursday. Dysuria, frequency. Hematuria. Previous appendectomy and cholecystectomy.  EXAM: CT ABDOMEN AND PELVIS WITH CONTRAST  TECHNIQUE: Multidetector CT imaging of the abdomen and pelvis was performed using the standard protocol following bolus administration of intravenous contrast.  CONTRAST:  OMNIPAQUE IOHEXOL 300 MG/ML  SOLN  COMPARISON:  None.  FINDINGS: No pericardial or pleural effusion. Mild atelectasis at the lung bases.  No acute osseous abnormality identified. Degenerative changes in the spine.  No pelvic free fluid. Negative uterus and adnexal. Negative rectum.  The sigmoid colon is redundant, extending into the right lower quadrant and then back across midline. Diverticulosis is noted without active inflammation. Occasional diverticula in the left colon which otherwise is negative. Negative transverse colon. Mildly redundant right colon. Oral contrast has just reached the ileocecal valve. Negative terminal ileum. No dilated small bowel. Decompressed stomach with small sliding-type gastric hiatal hernia. Negative duodenum.  Gallbladder surgically absent, and there are postoperative changes to the right upper quadrant abdominal wall. Negative liver, pancreas and adrenal glands. Normal spleen except for a small 12 mm hyperdense focus anteriorly (series 2, image 16), favor benign etiology such as hemangioma. Portal venous system within normal limits. Major arterial structures in the abdomen and pelvis are patent. No abdominal free fluid.  There is right greater than left periureteral stranding. There is mild hyperenhancement of the right renal pelvis and ureter. The right ureter is mildly dilated as it crosses into  the pelvis, but there is no obstructing calculus or lesion identified. The periureteral stranding continues to the ureterovesical junction.  Delayed images demonstrate symmetric and normal appearing renal contrast excretion. There are parapelvic renal cysts, more so on the left. No nephrolithiasis. No perianal abscess. The bladder is decompressed with evidence of perivesical stranding. No lymphadenopathy.  IMPRESSION: 1. Perivesical and right greater than left periureteral stranding and ureteral mural enhancement. No urologic calculus or obstructing lesion. Favor this appearance represents ascending urinary tract infection. No overt pyelonephritis at this time.  2. No other inflammatory process in the abdomen or pelvis.  3. Small 12 mm hypodense area in the spleen is nonspecific but likely benign (e.g. a small hemangioma).   Electronically Signed   By: Augusto Gamble M.D.   On: 06/05/2013 23:19   Dg Chest Port 1 View  06/05/2013   CLINICAL DATA:  Urinary tract infection. History of hypertension.  EXAM: PORTABLE CHEST - 1 VIEW  COMPARISON:  CHEST x-ray 04/28/2012.  FINDINGS: Lung volumes are within normal limits. No consolidative airspace disease. No pleural effusions. No evidence of pulmonary edema. No pneumothorax. The patient is rotated to the left on today's exam, resulting in distortion of the mediastinal contours and reduced diagnostic sensitivity and specificity for mediastinal pathology. Upper mediastinal contours are within normal limits.  IMPRESSION: 1. No radiographic evidence of acute cardiopulmonary disease.   Electronically Signed   By: Trudie Reed M.D.   On: 06/05/2013 21:20    EKG Interpretation   None       MDM   1. UTI (lower urinary tract infection)   2. Lower abdominal pain, right      [name] presents with dysuria for 3 days, reported fever at home and tachycardia on arrival. Concern for possible urosepsis. Patient with significant tenderness to palpation in the right lower quadrant however she is a history of appendectomy. Question possible colitis. Will begin sepsis workup and obtain CT scan.  12:07  AM Lactic acid within normal limits. Leukocytosis of 14.5. No evidence of renal failure with normal BUN and creatinine. UA with clear evidence of urinary tract infection with white blood cells too numerous to count positive nitrites and leukocytes. CT abdomen with likely a sending urinary tract infection but no pyelonephritis.  I personally reviewed the imaging tests through PACS system. I reviewed available ER/hospitalization records through the EMR.  Pt initially febrile and tachycardic.  Pt given tylenol and pain control with adequate relief. Will by mouth trial.   12:42 AM Patient tolerating by mouth without difficulty. Patient no longer tachycardic or febrile. Alert and oriented, nontoxic, nonseptic appearing. Recommend follow with primary care physician. Strict return precautions given. Questions answered and patient requests be discharged home.  BP 128/58  Pulse 82  Resp 17  Ht 5\' 9"  (1.753 m)  Wt 260 lb (117.935 kg)  BMI 38.38 kg/m2  SpO2 96%   Dierdre Forth, PA-C 06/06/13 [id number]

## 2013-06-06 MED ORDER — ONDANSETRON 8 MG PO TBDP: ORAL_TABLET | ORAL | Status: DC

## 2013-06-06 MED ORDER — CEFPODOXIME PROXETIL 200 MG PO TABS: 200.0000 mg | ORAL_TABLET | Freq: Two times a day (BID) | ORAL | Status: DC

## 2013-06-06 NOTE — ED Provider Notes (Signed)
Medical screening examination/treatment/procedure(s) were conducted as a shared visit with non-physician practitioner(s) and myself.  I personally evaluated the patient during the encounter.  EKG Interpretation   None       I interviewed and examined the patient. Lungs are CTAB. Cardiac exam wnl. Abdomen soft, pain does seem to localize to RLQ.    CT suggestive of early pyelonephritis. Got rocephin here, tolerating po. Will send home on po abx.   Junius Argyle, MD 06/06/13 [phone]

## 2013-06-06 NOTE — ED Notes (Signed)
Pt given graham crackers and a sprite. NO discomfort or emesis noted.

## 2013-06-07 DIAGNOSIS — N390 Urinary tract infection, site not specified: Secondary | ICD-10-CM | POA: Diagnosis not present

## 2013-06-07 LAB — URINE CULTURE: Colony Count: >=100000

## 2013-06-09 NOTE — ED Notes (Signed)
+   Urine  No treatment needed per Tiffany Greene.  

## 2013-06-12 LAB — CULTURE, BLOOD (ROUTINE X 2)
Culture: NO GROWTH
Culture: NO GROWTH

## 2013-06-13 DIAGNOSIS — H905 Unspecified sensorineural hearing loss: Secondary | ICD-10-CM | POA: Diagnosis not present

## 2013-06-29 DIAGNOSIS — J209 Acute bronchitis, unspecified: Secondary | ICD-10-CM | POA: Diagnosis not present

## 2013-06-29 DIAGNOSIS — N760 Acute vaginitis: Secondary | ICD-10-CM | POA: Diagnosis not present

## 2013-06-29 DIAGNOSIS — L819 Disorder of pigmentation, unspecified: Secondary | ICD-10-CM | POA: Diagnosis not present

## 2013-06-29 DIAGNOSIS — R300 Dysuria: Secondary | ICD-10-CM | POA: Diagnosis not present

## 2013-07-11 DIAGNOSIS — J209 Acute bronchitis, unspecified: Secondary | ICD-10-CM | POA: Diagnosis not present

## 2013-08-15 DIAGNOSIS — J209 Acute bronchitis, unspecified: Secondary | ICD-10-CM | POA: Diagnosis not present

## 2013-09-22 DIAGNOSIS — R350 Frequency of micturition: Secondary | ICD-10-CM | POA: Diagnosis not present

## 2013-10-05 DIAGNOSIS — Z1231 Encounter for screening mammogram for malignant neoplasm of breast: Secondary | ICD-10-CM | POA: Diagnosis not present

## 2013-10-18 DIAGNOSIS — E669 Obesity, unspecified: Secondary | ICD-10-CM | POA: Diagnosis not present

## 2013-10-18 DIAGNOSIS — N318 Other neuromuscular dysfunction of bladder: Secondary | ICD-10-CM | POA: Diagnosis not present

## 2013-10-18 DIAGNOSIS — I10 Essential (primary) hypertension: Secondary | ICD-10-CM | POA: Diagnosis not present

## 2013-10-18 DIAGNOSIS — E785 Hyperlipidemia, unspecified: Secondary | ICD-10-CM | POA: Diagnosis not present

## 2013-12-21 DIAGNOSIS — H2589 Other age-related cataract: Secondary | ICD-10-CM | POA: Diagnosis not present

## 2014-04-03 DIAGNOSIS — M19049 Primary osteoarthritis, unspecified hand: Secondary | ICD-10-CM | POA: Diagnosis not present

## 2014-04-18 DIAGNOSIS — I10 Essential (primary) hypertension: Secondary | ICD-10-CM | POA: Diagnosis not present

## 2014-04-18 DIAGNOSIS — E785 Hyperlipidemia, unspecified: Secondary | ICD-10-CM | POA: Diagnosis not present

## 2014-04-20 DIAGNOSIS — I10 Essential (primary) hypertension: Secondary | ICD-10-CM | POA: Diagnosis not present

## 2014-04-20 DIAGNOSIS — Z23 Encounter for immunization: Secondary | ICD-10-CM | POA: Diagnosis not present

## 2014-04-20 DIAGNOSIS — E039 Hypothyroidism, unspecified: Secondary | ICD-10-CM | POA: Diagnosis not present

## 2014-04-25 ENCOUNTER — Other Ambulatory Visit (HOSPITAL_COMMUNITY): Payer: Self-pay | Admitting: Internal Medicine

## 2014-04-25 DIAGNOSIS — M8580 Other specified disorders of bone density and structure, unspecified site: Secondary | ICD-10-CM

## 2014-04-28 ENCOUNTER — Ambulatory Visit (HOSPITAL_COMMUNITY)
Admission: RE | Admit: 2014-04-28 | Discharge: 2014-04-28 | Disposition: A | Discharge status: Home / Self Care | Payer: Medicare Other | Source: Ambulatory Visit | Attending: Internal Medicine | Admitting: Internal Medicine

## 2014-04-28 DIAGNOSIS — M899 Disorder of bone, unspecified: Secondary | ICD-10-CM | POA: Diagnosis not present

## 2014-04-28 DIAGNOSIS — M949 Disorder of cartilage, unspecified: Secondary | ICD-10-CM | POA: Diagnosis not present

## 2014-04-28 DIAGNOSIS — M8580 Other specified disorders of bone density and structure, unspecified site: Secondary | ICD-10-CM

## 2014-04-28 DIAGNOSIS — Z780 Asymptomatic menopausal state: Secondary | ICD-10-CM | POA: Diagnosis not present

## 2014-06-02 DIAGNOSIS — J399 Disease of upper respiratory tract, unspecified: Secondary | ICD-10-CM | POA: Diagnosis not present

## 2014-06-02 DIAGNOSIS — N390 Urinary tract infection, site not specified: Secondary | ICD-10-CM | POA: Diagnosis not present

## 2014-07-12 ENCOUNTER — Other Ambulatory Visit (INDEPENDENT_AMBULATORY_CARE_PROVIDER_SITE_OTHER): Payer: Self-pay

## 2014-07-12 DIAGNOSIS — M1990 Unspecified osteoarthritis, unspecified site: Secondary | ICD-10-CM

## 2014-07-12 DIAGNOSIS — E7800 Pure hypercholesterolemia, unspecified: Secondary | ICD-10-CM

## 2014-07-12 DIAGNOSIS — Z01812 Encounter for preprocedural laboratory examination: Secondary | ICD-10-CM

## 2014-07-12 DIAGNOSIS — K2100 Gastro-esophageal reflux disease with esophagitis, without bleeding: Secondary | ICD-10-CM

## 2014-07-12 DIAGNOSIS — E079 Disorder of thyroid, unspecified: Secondary | ICD-10-CM

## 2014-07-12 DIAGNOSIS — I10 Essential (primary) hypertension: Secondary | ICD-10-CM

## 2014-07-24 DIAGNOSIS — J00 Acute nasopharyngitis [common cold]: Secondary | ICD-10-CM | POA: Diagnosis not present

## 2014-08-09 DIAGNOSIS — H25819 Combined forms of age-related cataract, unspecified eye: Secondary | ICD-10-CM | POA: Diagnosis not present

## 2014-08-09 DIAGNOSIS — H524 Presbyopia: Secondary | ICD-10-CM | POA: Diagnosis not present

## 2014-08-09 DIAGNOSIS — H52223 Regular astigmatism, bilateral: Secondary | ICD-10-CM | POA: Diagnosis not present

## 2014-08-09 DIAGNOSIS — H5203 Hypermetropia, bilateral: Secondary | ICD-10-CM | POA: Diagnosis not present

## 2014-08-10 ENCOUNTER — Ambulatory Visit (HOSPITAL_COMMUNITY): Admission: RE | Admit: 2014-08-10 | Payer: Medicare Other | Source: Ambulatory Visit | Admitting: General Surgery

## 2014-08-10 ENCOUNTER — Encounter (HOSPITAL_COMMUNITY): Admission: RE | Payer: Self-pay | Source: Ambulatory Visit

## 2014-08-10 SURGERY — BREATH TEST, FOR HELICOBACTER PYLORI

## 2014-08-28 ENCOUNTER — Encounter: Payer: Medicare Other | Attending: General Surgery | Admitting: Dietician

## 2014-08-28 DIAGNOSIS — Z6841 Body Mass Index (BMI) 40.0 and over, adult: Secondary | ICD-10-CM | POA: Diagnosis not present

## 2014-08-28 DIAGNOSIS — M25561 Pain in right knee: Secondary | ICD-10-CM | POA: Diagnosis not present

## 2014-08-28 DIAGNOSIS — M25512 Pain in left shoulder: Secondary | ICD-10-CM | POA: Diagnosis not present

## 2014-08-28 DIAGNOSIS — M25562 Pain in left knee: Secondary | ICD-10-CM | POA: Diagnosis not present

## 2014-08-28 DIAGNOSIS — Z713 Dietary counseling and surveillance: Secondary | ICD-10-CM | POA: Insufficient documentation

## 2014-08-28 NOTE — Progress Notes (Signed)
  Pre-Op Assessment Visit:  Pre-Operative RYGB Surgery  Medical Nutrition Therapy:  Appt start time: 0830   End time:  0900.  Patient was seen on 08/28/2014 for Pre-Operative Nutrition Assessment. Assessment and letter of approval faxed to [facility] Bariatric Surgery Program coordinator on 08/28/2014.   Preferred Learning Style:   No preference indicated   Learning Readiness:   Ready  Handouts given during visit include:  Pre-Op Goals Bariatric Surgery Protein Shakes   During the appointment today the following Pre-Op Goals were reviewed with the patient: Maintain or lose weight as instructed by your surgeon Make healthy food choices Begin to limit portion sizes Limited concentrated sugars and fried foods Keep fat/sugar in the single digits per serving on   food labels Practice CHEWING your food  (aim for 30 chews per bite or until applesauce consistency) Practice not drinking 15 minutes before, during, and 30 minutes after each meal/snack Avoid all carbonated beverages  Avoid/limit caffeinated beverages  Avoid all sugar-sweetened beverages Consume 3 meals per day; eat every 3-5 hours Make a list of non-food related activities Aim for 64-100 ounces of FLUID daily  Aim for at least 60-80 grams of PROTEIN daily Look for a liquid protein source that contain ?15 g protein and ?5 g carbohydrate  (ex: shakes, drinks, shots)  Patient-Centered Goals: [name] would like to be able to do more activities with her grandchildren.  She feels like that the goals are a 10 level of importance and she is confident she will be able to do them.   Demonstrated degree of understanding via:  Teach Back  Teaching Method Utilized:  Visual Auditory Hands on  Barriers to learning/adherence to lifestyle change: none  [facility] to [facility] to enroll in Pre-Op and Post-Op Nutrition Education when surgery date is scheduled.

## 2014-08-28 NOTE — Patient Instructions (Signed)

## 2014-09-05 DIAGNOSIS — M25512 Pain in left shoulder: Secondary | ICD-10-CM | POA: Diagnosis not present

## 2014-09-20 DIAGNOSIS — M25562 Pain in left knee: Secondary | ICD-10-CM | POA: Diagnosis not present

## 2014-09-20 DIAGNOSIS — M25512 Pain in left shoulder: Secondary | ICD-10-CM | POA: Diagnosis not present

## 2014-09-20 DIAGNOSIS — M25561 Pain in right knee: Secondary | ICD-10-CM | POA: Diagnosis not present

## 2014-09-27 ENCOUNTER — Encounter: Payer: Medicare Other | Attending: General Surgery | Admitting: Dietician

## 2014-09-27 DIAGNOSIS — Z713 Dietary counseling and surveillance: Secondary | ICD-10-CM | POA: Insufficient documentation

## 2014-09-27 DIAGNOSIS — Z6841 Body Mass Index (BMI) 40.0 and over, adult: Secondary | ICD-10-CM | POA: Insufficient documentation

## 2014-09-27 NOTE — Patient Instructions (Signed)
Work on eating 3 meals per day day. Try to avoiding drink 15 minute before up through 30 minutes after you eat. Continue working on rest of pre op goals. Use treadmill for 30 minutes and do weights or use bike, start with 3 x a week and increase when you can

## 2014-09-27 NOTE — Progress Notes (Signed)
  6 Months Supervised Weight Loss Visit:   Pre-Operative RYGB Surgery  Medical Nutrition Therapy:  Appt start time: 0800 end time:  0815.  Primary concerns today: Supervised Weight Loss Visit. Returns with an 8 lb weight loss since last month. Has been working on drinking protein shakes [employer]), cut out all sweets and most bread, and not drinking sodas. Started riding her bicycle some but is still sore from a fall. Has been chewing food well. Not having any caffeine.   Trying to eat 3 meals per day.    Patient-Centered Goals: [place] would like to be able to do more activities with her grandchildren.   Preferred Learning Style:   No preference indicated   Learning Readiness:   Ready   Beverages: no calorie water  Medications: see list  Recent physical activity:  Riding bike   Progress Towards Goal(s):  In progress.  Nutritional Diagnosis:   [place]-3.3 Obesity related to past poor dietary habits and physical inactivity as evidenced by patient attending supervised weight loss for insurance approval of bariatric surgery.    Intervention:  Nutrition counseling provided. Plan: Work on eating 3 meals per day day. Try to avoiding drink 15 minute before up through 30 minutes after you eat. Continue working on rest of pre op goals. Use treadmill for 30 minutes and do weights or use bike, start with 3 x a week and increase when you can  Teaching Method Utilized:  Visual Auditory Hands on  Barriers to learning/adherence to lifestyle change: none  Demonstrated degree of understanding via:  Teach Back   Monitoring/Evaluation:  Dietary intake, exercise, and body weight. Follow up in 1  months for 6 month supervised weight loss visit.

## 2014-09-28 ENCOUNTER — Other Ambulatory Visit: Payer: Self-pay | Admitting: Obstetrics and Gynecology

## 2014-09-28 DIAGNOSIS — Z124 Encounter for screening for malignant neoplasm of cervix: Secondary | ICD-10-CM | POA: Diagnosis not present

## 2014-09-28 DIAGNOSIS — Z6841 Body Mass Index (BMI) 40.0 and over, adult: Secondary | ICD-10-CM | POA: Diagnosis not present

## 2014-09-29 LAB — CYTOLOGY - PAP

## 2014-10-18 DIAGNOSIS — M25512 Pain in left shoulder: Secondary | ICD-10-CM | POA: Diagnosis not present

## 2014-10-18 DIAGNOSIS — M25562 Pain in left knee: Secondary | ICD-10-CM | POA: Diagnosis not present

## 2014-10-18 DIAGNOSIS — M25561 Pain in right knee: Secondary | ICD-10-CM | POA: Diagnosis not present

## 2014-10-19 ENCOUNTER — Other Ambulatory Visit: Payer: Self-pay | Admitting: Physician Assistant

## 2014-10-19 DIAGNOSIS — M25512 Pain in left shoulder: Secondary | ICD-10-CM

## 2014-10-24 DIAGNOSIS — R7301 Impaired fasting glucose: Secondary | ICD-10-CM | POA: Diagnosis not present

## 2014-10-24 DIAGNOSIS — E782 Mixed hyperlipidemia: Secondary | ICD-10-CM | POA: Diagnosis not present

## 2014-10-25 ENCOUNTER — Encounter: Payer: Medicare Other | Attending: General Surgery | Admitting: Dietician

## 2014-10-25 DIAGNOSIS — Z713 Dietary counseling and surveillance: Secondary | ICD-10-CM | POA: Diagnosis not present

## 2014-10-25 DIAGNOSIS — Z6841 Body Mass Index (BMI) 40.0 and over, adult: Secondary | ICD-10-CM | POA: Diagnosis not present

## 2014-10-25 NOTE — Patient Instructions (Signed)
Work on eating 3 meals per day day. Try to avoiding drink 15 minute before up through 30 minutes after you eat. Continue working on rest of pre op goals. Use bike 3 x a week for 30 minutes

## 2014-10-25 NOTE — Progress Notes (Signed)
  6 Months Supervised Weight Loss Visit:   Pre-Operative RYGB Surgery  Medical Nutrition Therapy:  Appt start time: 0825 end time:  0840.  Primary concerns today: Supervised Weight Loss Visit. Returns with an 4 lb weight loss since last time. Not able to exercise as much since she has plantar fasciitis. Still trying to eat 3 meals per day. Drinking mostly flavored water.   Wt Readings from Last 3 Encounters:  10/25/14 269 lb (122.018 kg)  09/27/14 273 lb 1.6 oz (123.877 kg)  08/28/14 281 lb 8 oz (127.688 kg)   Ht Readings from Last 3 Encounters:  10/25/14 5\' 8"  (1.727 m)  09/27/14 5\' 8"  (1.727 m)  08/28/14 5\' 8"  (1.727 m)   Body mass index is 40.91 kg/(m^2). @BMIFA @ Normalized weight-for-age data available only for age 26 to 89 years. Normalized stature-for-age data available only for age 26 to 61 years.    Patient-Centered Goals: [name] would like to be able to do more activities with her grandchildren.   Preferred Learning Style:   No preference indicated   Learning Readiness:   Ready   Beverages: no calorie water  Medications: see list  Recent physical activity:  Riding bike   Progress Towards Goal(s):  In progress.  Nutritional Diagnosis:   [place]-3.3 Obesity related to past poor dietary habits and physical inactivity as evidenced by patient attending supervised weight loss for insurance approval of bariatric surgery.    Intervention:  Nutrition counseling provided. Plan: Work on eating 3 meals per day day. Try to avoiding drink 15 minute before up through 30 minutes after you eat. Continue working on rest of pre op goals. Use bike 3 x a week for 30 minutes  Teaching Method Utilized:  Visual Auditory Hands on  Barriers to learning/adherence to lifestyle change: none  Demonstrated degree of understanding via:  Teach Back   Monitoring/Evaluation:  Dietary intake, exercise, and body weight. Follow up in 1  months for 6 month supervised weight loss visit.

## 2014-10-26 DIAGNOSIS — M722 Plantar fascial fibromatosis: Secondary | ICD-10-CM | POA: Diagnosis not present

## 2014-10-26 DIAGNOSIS — E6609 Other obesity due to excess calories: Secondary | ICD-10-CM | POA: Diagnosis not present

## 2014-10-26 DIAGNOSIS — M25519 Pain in unspecified shoulder: Secondary | ICD-10-CM | POA: Diagnosis not present

## 2014-10-26 DIAGNOSIS — R7301 Impaired fasting glucose: Secondary | ICD-10-CM | POA: Diagnosis not present

## 2014-11-11 ENCOUNTER — Ambulatory Visit
Admission: RE | Admit: 2014-11-11 | Discharge: 2014-11-11 | Disposition: A | Discharge status: Home / Self Care | Payer: Medicare Other | Source: Ambulatory Visit | Attending: Physician Assistant | Admitting: Physician Assistant

## 2014-11-11 DIAGNOSIS — M25512 Pain in left shoulder: Secondary | ICD-10-CM

## 2014-11-11 DIAGNOSIS — M75102 Unspecified rotator cuff tear or rupture of left shoulder, not specified as traumatic: Secondary | ICD-10-CM | POA: Diagnosis not present

## 2014-11-11 DIAGNOSIS — M7552 Bursitis of left shoulder: Secondary | ICD-10-CM | POA: Diagnosis not present

## 2014-11-11 DIAGNOSIS — M19012 Primary osteoarthritis, left shoulder: Secondary | ICD-10-CM | POA: Diagnosis not present

## 2014-11-13 DIAGNOSIS — M25512 Pain in left shoulder: Secondary | ICD-10-CM | POA: Diagnosis not present

## 2014-11-15 DIAGNOSIS — Z1231 Encounter for screening mammogram for malignant neoplasm of breast: Secondary | ICD-10-CM | POA: Diagnosis not present

## 2014-11-22 ENCOUNTER — Encounter: Payer: Medicare Other | Attending: General Surgery | Admitting: Dietician

## 2014-11-22 DIAGNOSIS — Z6841 Body Mass Index (BMI) 40.0 and over, adult: Secondary | ICD-10-CM | POA: Insufficient documentation

## 2014-11-22 DIAGNOSIS — Z713 Dietary counseling and surveillance: Secondary | ICD-10-CM | POA: Insufficient documentation

## 2014-11-22 NOTE — Progress Notes (Signed)
  6 Months Supervised Weight Loss Visit:   Pre-Operative RYGB Surgery  Medical Nutrition Therapy:  Appt start time: 0830 end time:  0845.  Primary concerns today: Supervised Weight Loss Visit #3. Returns with an 6 lb weight loss since last time. Had been on prednisone during the past month for a shoulder fracture. Still recovering and not able to do much exercise d/t plantar fasciitis. Eating 3 meals per day and using an exercise bike 3 x week.    Wt Readings from Last 3 Encounters:  11/22/14 263 lb 14.4 oz (119.704 kg)  10/25/14 269 lb (122.018 kg)  09/27/14 273 lb 1.6 oz (123.877 kg)   Ht Readings from Last 3 Encounters:  11/22/14 5\' 8"  (1.727 m)  10/25/14 5\' 8"  (1.727 m)  09/27/14 5\' 8"  (1.727 m)   Body mass index is 40.14 kg/(m^2). @BMIFA @ Normalized weight-for-age data available only for age 64 to 40 years. Normalized stature-for-age data available only for age 64 to 57 years.  Patient-Centered Goals: Kaiyah would like to be able to do more activities with her grandchildren.   Preferred Learning Style:   No preference indicated   Learning Readiness:   Ready   Beverages: no calorie water  Medications: see list  Recent physical activity:  Riding bike 3 x week for 30 minutes  Progress Towards Goal(s):  In progress.  Nutritional Diagnosis:   [place]-3.3 Obesity related to past poor dietary habits and physical inactivity as evidenced by patient attending supervised weight loss for insurance approval of bariatric surgery.    Intervention:  Nutrition counseling provided. Plan: Continue avoiding drink 15 minute before up through 30 minutes after you eat. Continue working on rest of pre op goals. Aim to use they bike 5 x week for 30 minutes. Make sure you drink 64 oz of water, aim to have half of your plate vegetables at lunch and dinner, and have beans several times per week to help with constipation.   Teaching Method Utilized:  Visual Auditory Hands on  Barriers to  learning/adherence to lifestyle change: none  Demonstrated degree of understanding via:  Teach Back   Monitoring/Evaluation:  Dietary intake, exercise, and body weight. Follow up in 1  months for 6 month supervised weight loss visit.

## 2014-11-22 NOTE — Patient Instructions (Addendum)
Continue avoiding drink 15 minute before up through 30 minutes after you eat. Continue working on rest of pre op goals. Aim to use they bike 5 x week for 30 minutes. Make sure you drink 64 oz of water, aim to have half of your plate vegetables at lunch and dinner, and have beans several times per week to help with constipation.

## 2014-12-11 DIAGNOSIS — M722 Plantar fascial fibromatosis: Secondary | ICD-10-CM | POA: Diagnosis not present

## 2014-12-11 DIAGNOSIS — M25512 Pain in left shoulder: Secondary | ICD-10-CM | POA: Diagnosis not present

## 2014-12-12 DIAGNOSIS — E039 Hypothyroidism, unspecified: Secondary | ICD-10-CM | POA: Diagnosis not present

## 2014-12-20 ENCOUNTER — Encounter: Payer: Medicare Other | Attending: General Surgery | Admitting: Dietician

## 2014-12-20 ENCOUNTER — Encounter: Payer: Self-pay | Admitting: Dietician

## 2014-12-20 VITALS — Ht 68.0 in | Wt 263.9 lb

## 2014-12-20 DIAGNOSIS — E669 Obesity, unspecified: Secondary | ICD-10-CM

## 2014-12-20 DIAGNOSIS — Z713 Dietary counseling and surveillance: Secondary | ICD-10-CM | POA: Diagnosis not present

## 2014-12-20 DIAGNOSIS — Z6841 Body Mass Index (BMI) 40.0 and over, adult: Secondary | ICD-10-CM | POA: Diagnosis not present

## 2014-12-20 NOTE — Patient Instructions (Addendum)
Continue avoiding drink 15 minute before up through 30 minutes after you eat. Continue working on rest of pre op goals. Aim to use bike/walking/weights at gym 3 x week.  Continue working on drinking more water (goal 64 oz).  Increase vegetable intake at meals.

## 2014-12-20 NOTE — Progress Notes (Signed)
  6 Months Supervised Weight Loss Visit:   Pre-Operative RYGB Surgery  Medical Nutrition Therapy:  Appt start time: 0845 end time:  0800.  Primary concerns today: Supervised Weight Loss Visit #4. Returns with no change to weight since last time. Still has not been able to exercise but thinks she will be able to go next time. Drinking more water than before. Using exercise bike some. Constipation is better since she added fiber gummies and eating beans.   Wt Readings from Last 3 Encounters:  12/20/14 263 lb 14.4 oz (119.704 kg)  11/22/14 263 lb 14.4 oz (119.704 kg)  10/25/14 269 lb (122.018 kg)   Ht Readings from Last 3 Encounters:  12/20/14 5\' 8"  (1.727 m)  11/22/14 5\' 8"  (1.727 m)  10/25/14 5\' 8"  (1.727 m)   Body mass index is 40.14 kg/(m^2). @BMIFA @ Normalized weight-for-age data available only for age 33 to 63 years. Normalized stature-for-age data available only for age 33 to 66 years.   Patient-Centered Goals: [place]oni would like to be able to do more activities with her grandchildren.   Preferred Learning Style:   No preference indicated   Learning Readiness:   Ready   Beverages: no calorie water  Medications: see list  Recent physical activity:  none  Progress Towards Goal(s):  In progress.  Nutritional Diagnosis:   [place]-3.3 Obesity related to past poor dietary habits and physical inactivity as evidenced by patient attending supervised weight loss for insurance approval of bariatric surgery.    Intervention:  Nutrition counseling provided. Plan: Continue avoiding drink 15 minute before up through 30 minutes after you eat. Continue working on rest of pre op goals. Aim to use bike/walking/weights at gym 3 x week.  Continue working on drinking more water (goal 64 oz).  Increase vegetable intake at meals.   Teaching Method Utilized:  Visual Auditory Hands on  Barriers to learning/adherence to lifestyle change: none  Demonstrated degree of understanding via:   Teach Back   Monitoring/Evaluation:  Dietary intake, exercise, and body weight. Follow up in 1  months for 6 month supervised weight loss visit.

## 2015-01-17 ENCOUNTER — Encounter: Payer: Medicare Other | Attending: General Surgery | Admitting: Dietician

## 2015-01-17 ENCOUNTER — Encounter: Payer: Self-pay | Admitting: Dietician

## 2015-01-17 DIAGNOSIS — Z6841 Body Mass Index (BMI) 40.0 and over, adult: Secondary | ICD-10-CM | POA: Diagnosis not present

## 2015-01-17 DIAGNOSIS — Z713 Dietary counseling and surveillance: Secondary | ICD-10-CM | POA: Insufficient documentation

## 2015-01-17 NOTE — Patient Instructions (Addendum)
Continue avoiding drink 15 minute before up through 30 minutes after you eat. Continue working on rest of pre op goals. Try to do more exercise when your pain is better. Continue working on drinking more water (goal 64 oz).  Continue to have vegetables at meals.

## 2015-01-17 NOTE — Progress Notes (Signed)
  6 Months Supervised Weight Loss Visit:   Pre-Operative RYGB Surgery  Medical Nutrition Therapy:  Appt start time: 0840 end time:  0855.  Primary concerns today: Supervised Weight Loss Visit #5. Returns with a 3 lb weight loss since last. Has lost more than 20 lbs since January! Has been exercising (bike), though not in the last week. Thinks she might have re-injured her foot. Has a sore shoulder too. Has been working on drinking more water and getting more vegetables in.   Still helping a lot with grandchildren.  Patient-Centered Goals: Marchel would like to be able to do more activities with her grandchildren.   Preferred Learning Style:   No preference indicated   Learning Readiness:   Ready   Beverages: no calorie water  Medications: see list  Recent physical activity:  none  Progress Towards Goal(s):  In progress.  Nutritional Diagnosis:   [place]-3.3 Obesity related to past poor dietary habits and physical inactivity as evidenced by patient attending supervised weight loss for insurance approval of bariatric surgery.    Intervention:  Nutrition counseling provided. Plan: Continue avoiding drink 15 minute before up through 30 minutes after you eat. Continue working on rest of pre op goals. Try to do more exercise when your pain is better. Continue working on drinking more water (goal 64 oz).  Continue to have vegetables at meals.   Teaching Method Utilized:  Visual Auditory Hands on  Barriers to learning/adherence to lifestyle change: none  Demonstrated degree of understanding via:  Teach Back   Monitoring/Evaluation:  Dietary intake, exercise, and body weight. Follow up in 1  months for 6 month supervised weight loss visit.

## 2015-01-23 DIAGNOSIS — M722 Plantar fascial fibromatosis: Secondary | ICD-10-CM | POA: Diagnosis not present

## 2015-02-14 ENCOUNTER — Encounter: Payer: Medicare Other | Attending: General Surgery | Admitting: Dietician

## 2015-02-14 ENCOUNTER — Encounter: Payer: Self-pay | Admitting: Dietician

## 2015-02-14 DIAGNOSIS — Z713 Dietary counseling and surveillance: Secondary | ICD-10-CM | POA: Diagnosis not present

## 2015-02-14 DIAGNOSIS — Z6841 Body Mass Index (BMI) 40.0 and over, adult: Secondary | ICD-10-CM | POA: Diagnosis not present

## 2015-02-14 NOTE — Progress Notes (Signed)
  6 Months Supervised Weight Loss Visit:   Pre-Operative RYGB Surgery  Medical Nutrition Therapy:  Appt start time: 2482 end time:  0850.  Primary concerns today: Supervised Weight Loss Visit #6. Returns with no weight change since last visit. Has lost more than 20 lbs since January! Has been told not to exercise while her foot is healing. Started taking prednisone in late June and is no longer taking it. Though using the bike 2-3 x week. Has not had energy lately. Taking vitamins. Spending a lot of time outside with grandchildren this summer.  Feels like she is getting in 64 oz of fluid each day. Feels like she is sleeping enough.   Patient-Centered Goals: [name] would like to be able to do more activities with her grandchildren.   Preferred Learning Style:   No preference indicated   Learning Readiness:   Ready   Beverages: no calorie water  Medications: see list  Recent physical activity:  none  Progress Towards Goal(s):  In progress.  Nutritional Diagnosis:   [place]-3.3 Obesity related to past poor dietary habits and physical inactivity as evidenced by patient attending supervised weight loss for insurance approval of bariatric surgery.    Intervention:  Nutrition counseling provided. Plan: Continue working on pre op goals. Try to do more exercise when your foot heals.  Call [facility] at [phone] when surgery is scheduled to enroll in Pre-Op Class  Teaching Method Utilized:  Visual Auditory Hands on  Barriers to learning/adherence to lifestyle change: none  Demonstrated degree of understanding via:  Teach Back   Monitoring/Evaluation:  Dietary intake, exercise, and body weight. Follow up to attend Pre-Op Class.

## 2015-02-14 NOTE — Patient Instructions (Addendum)
Continue working on pre op goals. Try to do more exercise when your foot heals.  Call [facility] at [phone] when surgery is scheduled to enroll in Pre-Op Class

## 2015-03-14 DIAGNOSIS — I10 Essential (primary) hypertension: Secondary | ICD-10-CM | POA: Diagnosis not present

## 2015-03-14 DIAGNOSIS — E782 Mixed hyperlipidemia: Secondary | ICD-10-CM | POA: Diagnosis not present

## 2015-03-14 DIAGNOSIS — E039 Hypothyroidism, unspecified: Secondary | ICD-10-CM | POA: Diagnosis not present

## 2015-03-28 DIAGNOSIS — E039 Hypothyroidism, unspecified: Secondary | ICD-10-CM | POA: Diagnosis not present

## 2015-03-28 DIAGNOSIS — E785 Hyperlipidemia, unspecified: Secondary | ICD-10-CM | POA: Diagnosis not present

## 2015-03-28 DIAGNOSIS — I10 Essential (primary) hypertension: Secondary | ICD-10-CM | POA: Diagnosis not present

## 2015-04-19 ENCOUNTER — Ambulatory Visit: Payer: 59 | Admitting: Psychiatry

## 2015-04-26 ENCOUNTER — Ambulatory Visit (INDEPENDENT_AMBULATORY_CARE_PROVIDER_SITE_OTHER): Payer: Medicare Other | Admitting: Psychiatry

## 2015-05-03 DIAGNOSIS — Z23 Encounter for immunization: Secondary | ICD-10-CM | POA: Diagnosis not present

## 2015-05-08 ENCOUNTER — Ambulatory Visit (INDEPENDENT_AMBULATORY_CARE_PROVIDER_SITE_OTHER): Payer: Medicare Other | Admitting: Psychiatry

## 2015-05-17 ENCOUNTER — Other Ambulatory Visit (HOSPITAL_COMMUNITY): Payer: Self-pay | Admitting: General Surgery

## 2015-05-21 ENCOUNTER — Encounter: Payer: Medicare Other | Attending: General Surgery

## 2015-05-21 DIAGNOSIS — Z6838 Body mass index (BMI) 38.0-38.9, adult: Secondary | ICD-10-CM | POA: Diagnosis not present

## 2015-05-21 DIAGNOSIS — Z713 Dietary counseling and surveillance: Secondary | ICD-10-CM | POA: Insufficient documentation

## 2015-05-21 NOTE — Progress Notes (Signed)
  Pre-Operative Nutrition Class:  Appt start time: 830   End time:  930.  Patient was seen on 05/21/2015 for Pre-Operative Bariatric Surgery Education at [facility].   Surgery date: 06/19/15 Surgery type: RYGB Start weight at [facility]: 281.5 lbs on 08/28/14 Weight today: 254 lbs  TANITA  BODY COMP RESULTS  05/21/15   BMI (kg/m^2) 38.6   Fat Mass (lbs) 138.5   Fat Free Mass (lbs) 115.5   Total Body Water (lbs) 84.5   Samples given per MNT protocol. Patient educated on appropriate usage: Unjury protein powder (chicken soup - qty 1) Lot #: [id number] Exp: 02/2016  Premier protein shake (chocolate - qty 1) Lot #: [id number] Exp: 11/2015  Celebrate Vitamins Multivitamin chew (berry - qty 1) Lot #: [id number] Exp: 10/2016  PB2 (chocolate - qty 1) Lot #: N/[id number] Exp: 06/2015  The following the learning objectives were met by the patient during this course:  Identify Pre-Op Dietary Goals and will begin 2 weeks pre-operatively  Identify appropriate sources of fluids and proteins   State protein recommendations and appropriate sources pre and post-operatively  Identify Post-Operative Dietary Goals and will follow for 2 weeks post-operatively  Identify appropriate multivitamin and calcium sources  Describe the need for physical activity post-operatively and will follow MD recommendations  State when to call healthcare provider regarding medication questions or post-operative complications  Handouts given during class include:  Pre-Op Bariatric Surgery Diet Handout  Protein Shake Handout  Post-Op Bariatric Surgery Nutrition Handout  [facility]ELT Program Information Flyer  Support Group Information Flyer  [facility] Bariatric Supplements Price List  Follow-Up Plan: Patient will follow-up at [facility] 2 weeks post operatively for diet advancement per MD.

## 2015-05-24 ENCOUNTER — Ambulatory Visit (HOSPITAL_COMMUNITY)
Admission: RE | Admit: 2015-05-24 | Discharge: 2015-05-24 | Disposition: A | Discharge status: Home / Self Care | Payer: Medicare Other | Source: Ambulatory Visit | Attending: General Surgery | Admitting: General Surgery

## 2015-05-24 ENCOUNTER — Other Ambulatory Visit: Payer: Self-pay

## 2015-05-24 ENCOUNTER — Inpatient Hospital Stay (HOSPITAL_COMMUNITY): Admission: RE | Admit: 2015-05-24 | Payer: Self-pay | Source: Ambulatory Visit

## 2015-05-24 DIAGNOSIS — I501 Left ventricular failure: Secondary | ICD-10-CM | POA: Insufficient documentation

## 2015-05-24 DIAGNOSIS — Z01818 Encounter for other preprocedural examination: Secondary | ICD-10-CM | POA: Insufficient documentation

## 2015-05-24 DIAGNOSIS — K219 Gastro-esophageal reflux disease without esophagitis: Secondary | ICD-10-CM | POA: Diagnosis not present

## 2015-05-24 DIAGNOSIS — K449 Diaphragmatic hernia without obstruction or gangrene: Secondary | ICD-10-CM | POA: Insufficient documentation

## 2015-05-24 DIAGNOSIS — K5710 Diverticulosis of small intestine without perforation or abscess without bleeding: Secondary | ICD-10-CM | POA: Insufficient documentation

## 2015-05-24 DIAGNOSIS — Z9049 Acquired absence of other specified parts of digestive tract: Secondary | ICD-10-CM | POA: Diagnosis not present

## 2015-05-24 DIAGNOSIS — Z9889 Other specified postprocedural states: Secondary | ICD-10-CM | POA: Diagnosis not present

## 2015-05-24 DIAGNOSIS — I110 Hypertensive heart disease with heart failure: Secondary | ICD-10-CM | POA: Insufficient documentation

## 2015-05-24 DIAGNOSIS — I10 Essential (primary) hypertension: Secondary | ICD-10-CM | POA: Diagnosis not present

## 2015-05-24 DIAGNOSIS — I509 Heart failure, unspecified: Secondary | ICD-10-CM | POA: Diagnosis not present

## 2015-05-24 DIAGNOSIS — K922 Gastrointestinal hemorrhage, unspecified: Secondary | ICD-10-CM | POA: Diagnosis not present

## 2015-05-24 DIAGNOSIS — K224 Dyskinesia of esophagus: Secondary | ICD-10-CM | POA: Insufficient documentation

## 2015-06-05 DIAGNOSIS — K219 Gastro-esophageal reflux disease without esophagitis: Secondary | ICD-10-CM | POA: Diagnosis not present

## 2015-06-05 DIAGNOSIS — E6609 Other obesity due to excess calories: Secondary | ICD-10-CM | POA: Diagnosis not present

## 2015-06-05 DIAGNOSIS — I10 Essential (primary) hypertension: Secondary | ICD-10-CM | POA: Diagnosis not present

## 2015-06-05 DIAGNOSIS — E039 Hypothyroidism, unspecified: Secondary | ICD-10-CM | POA: Diagnosis not present

## 2015-06-08 ENCOUNTER — Other Ambulatory Visit: Payer: Self-pay | Admitting: General Surgery

## 2015-06-12 ENCOUNTER — Encounter (HOSPITAL_COMMUNITY): Payer: Self-pay

## 2015-06-12 NOTE — Patient Instructions (Addendum)
YOUR PROCEDURE IS SCHEDULED ON : 06/19/15  REPORT TO [facility] MAIN ENTRANCE FOLLOW SIGNS TO EAST ELEVATOR - GO TO 3rd FLOOR CHECK IN AT 3 EAST NURSES STATION (SHORT STAY) AT:  5:30 AM  CALL THIS NUMBER IF YOU HAVE PROBLEMS THE MORNING OF SURGERY [phone]  REMEMBER:ONLY 1 PER PERSON MAY GO TO SHORT STAY WITH YOU TO GET READY THE MORNING OF YOUR SURGERY  DO NOT EAT FOOD OR DRINK LIQUIDS AFTER MIDNIGHT  TAKE THESE MEDICINES THE MORNING OF SURGERY:ESCITALOPRAM / OMEPRAZOLE / OXYBUTYNIN   YOU MAY NOT HAVE ANY METAL ON YOUR BODY INCLUDING HAIR PINS AND PIERCING'S. DO NOT WEAR JEWELRY, MAKEUP, LOTIONS, POWDERS OR PERFUMES. DO NOT WEAR NAIL POLISH. DO NOT SHAVE 48 HRS PRIOR TO SURGERY. MEN MAY SHAVE FACE AND NECK.  DO NOT [place]. [place] IS NOT RESPONSIBLE FOR VALUABLES.  CONTACTS, DENTURES OR PARTIALS MAY NOT BE WORN TO SURGERY. LEAVE SUITCASE IN CAR. CAN BE BROUGHT TO ROOM AFTER SURGERY.  PATIENTS DISCHARGED THE DAY OF SURGERY WILL NOT BE ALLOWED TO DRIVE HOME.  PLEASE READ OVER THE FOLLOWING INSTRUCTION SHEETS _________________________________________________________________________________                                          [place] - PREPARING FOR SURGERY  Before surgery, you can play an important role.  Because skin is not sterile, your skin needs to be as free of germs as possible.  You can reduce the number of germs on your skin by washing with CHG (chlorahexidine gluconate) soap before surgery.  CHG is an antiseptic cleaner which kills germs and bonds with the skin to continue killing germs even after washing. Please DO NOT use if you have an allergy to CHG or antibacterial soaps.  If your skin becomes reddened/irritated stop using the CHG and inform your nurse when you arrive at Short Stay. Do not shave (including legs and underarms) for at least 48 hours prior to the first CHG shower.  You may shave your face. Please  follow these instructions carefully:   1.  Shower with CHG Soap the night before surgery and the  morning of Surgery.   2.  If you choose to wash your hair, wash your hair first as usual with your  normal  Shampoo.   3.  After you shampoo, rinse your hair and body thoroughly to remove the  shampoo.                                         4.  Use CHG as you would any other liquid soap.  You can apply chg directly  to the skin and wash . Gently wash with scrungie or clean wascloth    5.  Apply the CHG Soap to your body ONLY FROM THE NECK DOWN.   Do not use on open                           Wound or open sores. Avoid contact with eyes, ears mouth and genitals (private parts).                        Genitals (private parts) with your normal  soap.              6.  Wash thoroughly, paying special attention to the area where your surgery  will be performed.   7.  Thoroughly rinse your body with warm water from the neck down.   8.  DO NOT shower/wash with your normal soap after using and rinsing off  the CHG Soap .                9.  Pat yourself dry with a clean towel.             10.  Wear clean night clothes to bed after shower             11.  Place clean sheets on your bed the night of your first shower and do not  sleep with pets.  Day of Surgery : Do not apply any lotions/deodorants the morning of surgery.  Please wear clean clothes to the hospital/surgery center.  FAILURE TO FOLLOW THESE INSTRUCTIONS MAY RESULT IN THE CANCELLATION OF YOUR SURGERY    PATIENT SIGNATURE_________________________________  ______________________________________________________________________

## 2015-06-14 ENCOUNTER — Other Ambulatory Visit: Payer: Self-pay | Admitting: General Surgery

## 2015-06-14 ENCOUNTER — Ambulatory Visit
Admission: RE | Admit: 2015-06-14 | Discharge: 2015-06-14 | Disposition: A | Discharge status: Home / Self Care | Payer: Medicare Other | Source: Ambulatory Visit | Attending: General Surgery | Admitting: General Surgery

## 2015-06-14 ENCOUNTER — Encounter (HOSPITAL_COMMUNITY): Payer: Self-pay

## 2015-06-14 DIAGNOSIS — E785 Hyperlipidemia, unspecified: Secondary | ICD-10-CM | POA: Diagnosis not present

## 2015-06-14 DIAGNOSIS — Z01812 Encounter for preprocedural laboratory examination: Secondary | ICD-10-CM | POA: Diagnosis not present

## 2015-06-14 DIAGNOSIS — I10 Essential (primary) hypertension: Secondary | ICD-10-CM | POA: Diagnosis not present

## 2015-06-14 DIAGNOSIS — R011 Cardiac murmur, unspecified: Secondary | ICD-10-CM | POA: Diagnosis not present

## 2015-06-14 DIAGNOSIS — Z01818 Encounter for other preprocedural examination: Secondary | ICD-10-CM

## 2015-06-14 DIAGNOSIS — I350 Nonrheumatic aortic (valve) stenosis: Secondary | ICD-10-CM | POA: Diagnosis not present

## 2015-06-14 DIAGNOSIS — Z9889 Other specified postprocedural states: Secondary | ICD-10-CM | POA: Diagnosis not present

## 2015-06-14 DIAGNOSIS — I509 Heart failure, unspecified: Secondary | ICD-10-CM | POA: Diagnosis not present

## 2015-06-14 DIAGNOSIS — Z6837 Body mass index (BMI) 37.0-37.9, adult: Secondary | ICD-10-CM | POA: Diagnosis not present

## 2015-06-14 LAB — CBC WITH DIFFERENTIAL/PLATELET
BASOS PCT: 0 %
Basophils Absolute: 0 10*3/uL (ref 0.0–0.1)
EOS ABS: 0.1 10*3/uL (ref 0.0–0.7)
EOS PCT: 1 %
HCT: 47.6 % — ABNORMAL HIGH (ref 36.0–46.0)
Hemoglobin: 16 g/dL — ABNORMAL HIGH (ref 12.0–15.0)
LYMPHS ABS: 1.7 10*3/uL (ref 0.7–4.0)
Lymphocytes Relative: 25 %
MCH: 32.4 pg (ref 26.0–34.0)
MCHC: 33.6 g/dL (ref 30.0–36.0)
MCV: 96.4 fL (ref 78.0–100.0)
MONOS PCT: 9 %
Monocytes Absolute: 0.7 10*3/uL (ref 0.1–1.0)
NEUTROS PCT: 65 %
Neutro Abs: 4.4 10*3/uL (ref 1.7–7.7)
PLATELETS: 179 10*3/uL (ref 150–400)
RBC: 4.94 MIL/uL (ref 3.87–5.11)
RDW: 13.4 % (ref 11.5–15.5)
WBC: 6.9 10*3/uL (ref 4.0–10.5)

## 2015-06-14 LAB — COMPREHENSIVE METABOLIC PANEL
ALT: 20 U/L (ref 14–54)
ANION GAP: 7 (ref 5–15)
AST: 26 U/L (ref 15–41)
Albumin: 4.6 g/dL (ref 3.5–5.0)
Alkaline Phosphatase: 87 U/L (ref 38–126)
BUN: 31 mg/dL — ABNORMAL HIGH (ref 6–20)
CALCIUM: 9.6 mg/dL (ref 8.9–10.3)
CHLORIDE: 102 mmol/L (ref 101–111)
CO2: 28 mmol/L (ref 22–32)
Creatinine, Ser: 0.98 mg/dL (ref 0.44–1.00)
GFR calc Af Amer: >60 mL/min (ref >60)
GFR calc non Af Amer: 56 mL/min — ABNORMAL LOW (ref >60)
GLUCOSE: 106 mg/dL — AB (ref 65–99)
Potassium: 4.2 mmol/L (ref 3.5–5.1)
SODIUM: 137 mmol/L (ref 135–145)
Total Bilirubin: 0.8 mg/dL (ref 0.3–1.2)
Total Protein: 7.9 g/dL (ref 6.5–8.1)

## 2015-06-14 NOTE — Progress Notes (Signed)
Notified Dr.Hoxworth of CXR done 05/24/15 showing congestive heart failure. He said he would review it and contact pt for follow up Repeat CXR found in EPIC done 06/14/15 - partial resolution of CHF

## 2015-06-14 NOTE — Progress Notes (Signed)
Abnormal BMET faxed to Dr.Hoxworth 

## 2015-06-15 ENCOUNTER — Other Ambulatory Visit: Payer: Self-pay

## 2015-06-15 ENCOUNTER — Ambulatory Visit (HOSPITAL_BASED_OUTPATIENT_CLINIC_OR_DEPARTMENT_OTHER): Payer: Medicare Other

## 2015-06-15 ENCOUNTER — Telehealth: Payer: Self-pay

## 2015-06-15 ENCOUNTER — Encounter: Payer: Self-pay | Admitting: Cardiovascular Disease

## 2015-06-15 ENCOUNTER — Ambulatory Visit (INDEPENDENT_AMBULATORY_CARE_PROVIDER_SITE_OTHER): Payer: Medicare Other | Admitting: Cardiovascular Disease

## 2015-06-15 ENCOUNTER — Encounter (HOSPITAL_COMMUNITY)
Admission: RE | Admit: 2015-06-15 | Discharge: 2015-06-15 | Disposition: A | Discharge status: Home / Self Care | Payer: Medicare Other | Source: Ambulatory Visit | Attending: General Surgery | Admitting: General Surgery

## 2015-06-15 VITALS — BP 120/70 | HR 75 | Ht 68.0 in | Wt 250.0 lb

## 2015-06-15 DIAGNOSIS — R011 Cardiac murmur, unspecified: Secondary | ICD-10-CM | POA: Diagnosis not present

## 2015-06-15 DIAGNOSIS — Z6837 Body mass index (BMI) 37.0-37.9, adult: Secondary | ICD-10-CM | POA: Insufficient documentation

## 2015-06-15 DIAGNOSIS — Z01812 Encounter for preprocedural laboratory examination: Secondary | ICD-10-CM | POA: Diagnosis not present

## 2015-06-15 DIAGNOSIS — I350 Nonrheumatic aortic (valve) stenosis: Secondary | ICD-10-CM | POA: Insufficient documentation

## 2015-06-15 DIAGNOSIS — Z01818 Encounter for other preprocedural examination: Secondary | ICD-10-CM

## 2015-06-15 DIAGNOSIS — E785 Hyperlipidemia, unspecified: Secondary | ICD-10-CM | POA: Insufficient documentation

## 2015-06-15 DIAGNOSIS — I10 Essential (primary) hypertension: Secondary | ICD-10-CM | POA: Insufficient documentation

## 2015-06-15 HISTORY — DX: Unspecified urinary incontinence: R32

## 2015-06-15 HISTORY — DX: Other specified postprocedural states: R11.2

## 2015-06-15 HISTORY — DX: Unspecified osteoarthritis, unspecified site: M19.90

## 2015-06-15 HISTORY — DX: Adverse effect of unspecified anesthetic, initial encounter: T41.45XA

## 2015-06-15 HISTORY — DX: Family history of other specified conditions: Z84.89

## 2015-06-15 HISTORY — DX: Other complications of anesthesia, initial encounter: T88.59XA

## 2015-06-15 HISTORY — DX: Nausea with vomiting, unspecified: Z98.890

## 2015-06-15 NOTE — Patient Instructions (Signed)
Medication Instructions:  Your physician recommends that you continue on your current medications as directed. Please refer to the Current Medication list given to you today.  Labwork: NONE  Testing/Procedures: NONE  Follow-Up: Your physician wants you to follow-up in: 1 year with Dr. Nishan. You will receive a reminder letter in the mail two months in advance. If you don't receive a letter, please call our office to schedule the follow-up appointment.   Any Other Special Instructions Will Be Listed Below (If Applicable).     If you need a refill on your cardiac medications before your next appointment, please call your pharmacy.   

## 2015-06-15 NOTE — Telephone Encounter (Signed)
Called patient about echo and office appointment today. Patient verbalized understanding.

## 2015-06-15 NOTE — Progress Notes (Signed)
[name], female   DOB: 1941/05/13, 74 y.o.   MRN: [id number]     Cardiology Office Note   Date:  06/15/2015   ID:  [name], DOB 08/29/1940, MRN [id number]  PCP:  Wende Neighbors, MD  Cardiologist:   Jenkins Rouge, MD   No chief complaint on file.     History of Present Illness: [name] is a 74 y.o. female who presents for preop clearence per Dr Excell Seltzer.  Patient to have surgery 11/15.  History of HTN ? Rheumatic fever with 3 month hospitalization as child.  Chronic exertional dyspnea Preop CXR with CE ? Pulmonary edema 05/24/15 with f/u 11/2 partial resolution reviewed both and likely attenuated Xray from obesity.  Labs reviewed 11/2 and normal including TSH and Cr.  Dr Excell Seltzer indicated normal BNP But I did not see this .    Had uncomplicated right TKR 2 years ago.  Dyspnea is chronic and functional She wants to have bariatric surgery to keep up with her grandchildren No cough , sputum, fever, chest pain history of DVT  Reviewed her echo from today and EF normal 55-60% mild AR no evidence of rheumatic valve disease and normal RV/PA pressures    Past Medical History  Diagnosis Date  . HTN (hypertension)   . Anxiety   . Personal history of colonic polyps   . Hypothyroidism   . Hyperlipidemia   . Rheumatic fever 1958    hospitalized for three months  . GERD (gastroesophageal reflux disease)   . Complication of anesthesia   . PONV (postoperative nausea and vomiting)   . Family history of adverse reaction to anesthesia     "sister has hard time waking up"  . Arthritis   . Urinary leakage     Past Surgical History  Procedure Laterality Date  . Cesarean section  1979  . Back surgery  1986  . Cholecystectomy  1982  . Knee surgery  1999    left  . Total knee arthroplasty  10/2009    left  . Roster shots  2007    ?  Marland Kitchen Colonoscopy  2004    Dr. Imogene Burn polyps, left sided diverticula. Path unavailable.   Marland Kitchen Appendectomy     incidental at time of C-section  . Esophagogastroduodenoscopy  02/12/2011    Procedure: ESOPHAGOGASTRODUODENOSCOPY (EGD);  Surgeon: Daneil Dolin, MD;  Location: [facility];  Service: Endoscopy;  Laterality: N/A;  . Colonoscopy  02/12/2011    Procedure: COLONOSCOPY;  Surgeon: Daneil Dolin, MD;  Location: [facility];  Service: Endoscopy;  Laterality: N/A;     Current Outpatient Prescriptions  Medication Sig Dispense Refill  . acetaminophen (TYLENOL) 325 MG tablet Take 325 mg by mouth every 6 (six) hours as needed for mild pain or headache.    . Calcium Carbonate-Vitamin D (CALCIUM-D) 600-400 MG-UNIT TABS Take 1 tablet by mouth 3 (three) times daily.    . Cholecalciferol (VITAMIN D3) 5000 UNITS CAPS Take 5,000 Units by mouth daily.    . Cyanocobalamin (VITAMIN B-12) 2500 MCG SUBL Place 2,500 mcg under the tongue daily.     Marland Kitchen DIOVAN HCT 160-12.5 MG per tablet Take 1 tablet by mouth daily.     Marland Kitchen escitalopram (LEXAPRO) 20 MG tablet Take 20 mg by mouth daily.      Marland Kitchen levothyroxine (SYNTHROID, LEVOTHROID) 88 MCG tablet Take 88 mcg by mouth at bedtime.    . magnesium hydroxide (MILK OF MAGNESIA) 400 MG/5ML suspension Take  30 mLs by mouth daily as needed for mild constipation.    Marland Kitchen omeprazole (PRILOSEC) 20 MG capsule Take 20 mg by mouth daily.     Marland Kitchen oxybutynin (DITROPAN) 5 MG tablet Take 5 mg by mouth 2 (two) times daily.     . Pediatric Multivit-Minerals-C (CHEWABLES MULTIVITAMIN PO) Take 1 tablet by mouth 2 (two) times daily.    . simvastatin (ZOCOR) 40 MG tablet Take 40 mg by mouth at bedtime.     . sulfamethoxazole-trimethoprim (BACTRIM DS,SEPTRA DS) 800-160 MG tablet Take 1 tablet by mouth 2 (two) times daily. Started taking 06/09/15 for 7 day course    . vitamin C (ASCORBIC ACID) 500 MG tablet Take 500 mg by mouth 2 (two) times daily.      No current facility-administered medications for this visit.    Allergies:   Review of patient's allergies indicates no known allergies.    Social  History:  The patient  reports that she has never smoked. She does not have any smokeless tobacco history on file. She reports that she does not drink alcohol or use illicit drugs.   Family History:  The patient's family history includes Diabetes in an other family member; Heart disease in her brother; Lung disease in her father. There is no history of Colon cancer.    ROS:  Please see the history of present illness.   Otherwise, review of systems are positive for none.   All other systems are reviewed and negative.    PHYSICAL EXAM: VS:  There were no vitals taken for this visit. , BMI There is no weight on file to calculate BMI. Affect appropriate Healthy:  appears stated age 58: normal Neck supple with no adenopathy JVP normal no bruits no thyromegaly Lungs clear with no wheezing and good diaphragmatic motion Heart:  S1/S2 no murmur, no rub, gallop or click PMI normal Abdomen: benighn, BS positve, no tenderness, no AAA no bruit.  No HSM or HJR Distal pulses intact with no bruits No edema Neuro non-focal Skin warm and dry No muscular weakness    EKG:   05/24/15  SR rate 64  LAD poor R wave progression ano acute changes compare to 2015    Recent Labs: 06/14/2015: ALT 20; BUN 31*; Creatinine, Ser 0.98; Hemoglobin 16.0*; Platelets 179; Potassium 4.2; Sodium 137  .last  Lipid Panel No results found for: CHOL, TRIG, HDL, CHOLHDL, VLDL, LDLCALC, LDLDIRECT    Wt Readings from Last 3 Encounters:  06/14/15 112.946 kg (249 lb)  05/21/15 115.214 kg (254 lb)  02/14/15 117.527 kg (259 lb 1.6 oz)      Other studies Reviewed: Additional studies/ records that were reviewed today include: Epic pre surgical notes CXR;s ECG and notes Dr Excell Seltzer .    ASSESSMENT AND PLAN:  1.  Preop:  Clear to have bariatric surgery Normal exam, no problems with general anesthesia 2 years ago Echo is fine 2. AR: mild no rheumatic disease on echo f/u in 2 years 3. Dyspnea:  Functional from  obesity normal exam Normal RV/LV function on echo  4. HTN:  Well controlled.  Continue current medications and low sodium Dash type diet.   5. Obesity for gastric bypass on Tuesday with Dr Excell Seltzer   Current medicines are reviewed at length with the patient today.  The patient does not have concerns regarding medicines.  The following changes have been made:  no change  Labs/ tests ordered today include: Echo  No orders of the defined types were placed  in this encounter.     Disposition:   FU with me in 6 months      Signed, Jenkins Rouge, MD  06/15/2015 1:35 PM    [facility] [address] [place], [place], [place]  [address] Phone: [phone]; Fax: [phone]

## 2015-06-18 NOTE — Progress Notes (Signed)
Cardiac Clearance in EPIC

## 2015-06-19 ENCOUNTER — Inpatient Hospital Stay (HOSPITAL_COMMUNITY): Payer: Medicare Other | Admitting: Certified Registered"

## 2015-06-19 ENCOUNTER — Encounter (HOSPITAL_COMMUNITY): Payer: Self-pay | Admitting: *Deleted

## 2015-06-19 ENCOUNTER — Inpatient Hospital Stay (HOSPITAL_COMMUNITY)
Admission: RE | Admit: 2015-06-19 | Discharge: 2015-06-21 | DRG: 621 | Disposition: A | Discharge status: Home / Self Care | Payer: Medicare Other | Source: Ambulatory Visit | Attending: General Surgery | Admitting: General Surgery

## 2015-06-19 ENCOUNTER — Encounter (HOSPITAL_COMMUNITY)
Admission: RE | Disposition: A | Discharge status: Home / Self Care | Payer: Self-pay | Source: Ambulatory Visit | Attending: General Surgery

## 2015-06-19 DIAGNOSIS — G8929 Other chronic pain: Secondary | ICD-10-CM | POA: Diagnosis present

## 2015-06-19 DIAGNOSIS — Z6838 Body mass index (BMI) 38.0-38.9, adult: Secondary | ICD-10-CM | POA: Diagnosis not present

## 2015-06-19 DIAGNOSIS — Z8249 Family history of ischemic heart disease and other diseases of the circulatory system: Secondary | ICD-10-CM

## 2015-06-19 DIAGNOSIS — Z833 Family history of diabetes mellitus: Secondary | ICD-10-CM | POA: Diagnosis not present

## 2015-06-19 DIAGNOSIS — Z9884 Bariatric surgery status: Secondary | ICD-10-CM

## 2015-06-19 DIAGNOSIS — E785 Hyperlipidemia, unspecified: Secondary | ICD-10-CM | POA: Diagnosis present

## 2015-06-19 DIAGNOSIS — E079 Disorder of thyroid, unspecified: Secondary | ICD-10-CM | POA: Diagnosis present

## 2015-06-19 DIAGNOSIS — R011 Cardiac murmur, unspecified: Secondary | ICD-10-CM | POA: Diagnosis present

## 2015-06-19 DIAGNOSIS — R32 Unspecified urinary incontinence: Secondary | ICD-10-CM | POA: Diagnosis present

## 2015-06-19 DIAGNOSIS — K449 Diaphragmatic hernia without obstruction or gangrene: Secondary | ICD-10-CM | POA: Diagnosis present

## 2015-06-19 DIAGNOSIS — M1990 Unspecified osteoarthritis, unspecified site: Secondary | ICD-10-CM | POA: Diagnosis present

## 2015-06-19 DIAGNOSIS — Z9889 Other specified postprocedural states: Secondary | ICD-10-CM | POA: Diagnosis not present

## 2015-06-19 DIAGNOSIS — Z794 Long term (current) use of insulin: Secondary | ICD-10-CM | POA: Diagnosis not present

## 2015-06-19 DIAGNOSIS — Z7982 Long term (current) use of aspirin: Secondary | ICD-10-CM | POA: Diagnosis not present

## 2015-06-19 DIAGNOSIS — I10 Essential (primary) hypertension: Secondary | ICD-10-CM | POA: Diagnosis present

## 2015-06-19 DIAGNOSIS — E7800 Pure hypercholesterolemia, unspecified: Secondary | ICD-10-CM | POA: Diagnosis present

## 2015-06-19 DIAGNOSIS — Z6841 Body Mass Index (BMI) 40.0 and over, adult: Secondary | ICD-10-CM

## 2015-06-19 DIAGNOSIS — Z823 Family history of stroke: Secondary | ICD-10-CM

## 2015-06-19 DIAGNOSIS — K219 Gastro-esophageal reflux disease without esophagitis: Secondary | ICD-10-CM | POA: Diagnosis present

## 2015-06-19 DIAGNOSIS — Z79899 Other long term (current) drug therapy: Secondary | ICD-10-CM | POA: Diagnosis not present

## 2015-06-19 DIAGNOSIS — Z01812 Encounter for preprocedural laboratory examination: Secondary | ICD-10-CM

## 2015-06-19 DIAGNOSIS — E119 Type 2 diabetes mellitus without complications: Secondary | ICD-10-CM | POA: Diagnosis not present

## 2015-06-19 HISTORY — PX: GASTRIC ROUX-EN-Y: SHX5262

## 2015-06-19 HISTORY — PX: UPPER GI ENDOSCOPY: SHX6162

## 2015-06-19 LAB — HEMOGLOBIN AND HEMATOCRIT, BLOOD
HCT: 44.2 % (ref 36.0–46.0)
Hemoglobin: 14.8 g/dL (ref 12.0–15.0)

## 2015-06-19 SURGERY — LAPAROSCOPIC ROUX-EN-Y GASTRIC BYPASS WITH UPPER ENDOSCOPY
Anesthesia: General

## 2015-06-19 MED ORDER — NEOSTIGMINE METHYLSULFATE 10 MG/10ML IV SOLN
INTRAVENOUS | Status: AC
  Filled 2015-06-19: qty 1

## 2015-06-19 MED ORDER — PROPOFOL 10 MG/ML IV BOLUS
INTRAVENOUS | Status: DC | PRN
  Administered 2015-06-19: 150 mg via INTRAVENOUS

## 2015-06-19 MED ORDER — ROCURONIUM BROMIDE 100 MG/10ML IV SOLN
INTRAVENOUS | Status: AC
  Filled 2015-06-19: qty 1

## 2015-06-19 MED ORDER — ACETAMINOPHEN 160 MG/5ML PO SOLN: 650.0000 mg | ORAL | Status: DC | PRN

## 2015-06-19 MED ORDER — BUPIVACAINE LIPOSOME 1.3 % IJ SUSP
20.0000 mL | Freq: Once | INTRAMUSCULAR | Status: AC
  Administered 2015-06-19: 20 mL
  Filled 2015-06-19: qty 20

## 2015-06-19 MED ORDER — STERILE WATER FOR INJECTION IJ SOLN
INTRAMUSCULAR | Status: AC
  Filled 2015-06-19: qty 10

## 2015-06-19 MED ORDER — EPHEDRINE SULFATE 50 MG/ML IJ SOLN
INTRAMUSCULAR | Status: AC
  Filled 2015-06-19: qty 1

## 2015-06-19 MED ORDER — DEXAMETHASONE SODIUM PHOSPHATE 10 MG/ML IJ SOLN
INTRAMUSCULAR | Status: AC
  Filled 2015-06-19: qty 1

## 2015-06-19 MED ORDER — MEPERIDINE HCL 50 MG/ML IJ SOLN: 6.2500 mg | INTRAMUSCULAR | Status: DC | PRN

## 2015-06-19 MED ORDER — POTASSIUM CHLORIDE IN NACL 20-0.9 MEQ/L-% IV SOLN
INTRAVENOUS | Status: DC
  Administered 2015-06-19: 100 mL/h via INTRAVENOUS
  Administered 2015-06-19 – 2015-06-21 (×4): via INTRAVENOUS
  Filled 2015-06-19 (×8): qty 1000

## 2015-06-19 MED ORDER — ENOXAPARIN SODIUM 30 MG/0.3ML ~~LOC~~ SOLN
30.0000 mg | Freq: Two times a day (BID) | SUBCUTANEOUS | Status: DC
  Administered 2015-06-19 – 2015-06-21 (×4): 30 mg via SUBCUTANEOUS
  Filled 2015-06-19 (×5): qty 0.3

## 2015-06-19 MED ORDER — LIDOCAINE HCL (CARDIAC) 20 MG/ML IV SOLN
INTRAVENOUS | Status: DC | PRN
  Administered 2015-06-19: 50 mg via INTRAVENOUS

## 2015-06-19 MED ORDER — CHLORHEXIDINE GLUCONATE CLOTH 2 % EX PADS: 6.0000 | MEDICATED_PAD | Freq: Once | CUTANEOUS | Status: DC

## 2015-06-19 MED ORDER — METOCLOPRAMIDE HCL 5 MG/ML IJ SOLN
INTRAMUSCULAR | Status: AC
  Filled 2015-06-19: qty 2

## 2015-06-19 MED ORDER — LIDOCAINE HCL (CARDIAC) 20 MG/ML IV SOLN
INTRAVENOUS | Status: AC
  Filled 2015-06-19: qty 5

## 2015-06-19 MED ORDER — SUGAMMADEX SODIUM 200 MG/2ML IV SOLN
INTRAVENOUS | Status: AC
  Filled 2015-06-19: qty 2

## 2015-06-19 MED ORDER — ONDANSETRON HCL 4 MG/2ML IJ SOLN
INTRAMUSCULAR | Status: AC
  Filled 2015-06-19: qty 2

## 2015-06-19 MED ORDER — ONDANSETRON HCL 4 MG/2ML IJ SOLN: 4.0000 mg | INTRAMUSCULAR | Status: DC | PRN

## 2015-06-19 MED ORDER — HYDROMORPHONE HCL 1 MG/ML IJ SOLN
INTRAMUSCULAR | Status: AC
  Filled 2015-06-19: qty 1

## 2015-06-19 MED ORDER — TISSEEL VH 10 ML EX KIT
PACK | CUTANEOUS | Status: AC
  Filled 2015-06-19: qty 2

## 2015-06-19 MED ORDER — BUPIVACAINE-EPINEPHRINE 0.25% -1:200000 IJ SOLN
INTRAMUSCULAR | Status: AC
  Filled 2015-06-19: qty 1

## 2015-06-19 MED ORDER — DEXTROSE 5 % IV SOLN
INTRAVENOUS | Status: AC
  Filled 2015-06-19: qty 2

## 2015-06-19 MED ORDER — METOCLOPRAMIDE HCL 5 MG/ML IJ SOLN
INTRAMUSCULAR | Status: DC | PRN
  Administered 2015-06-19: 10 mg via INTRAVENOUS

## 2015-06-19 MED ORDER — MIDAZOLAM HCL 2 MG/2ML IJ SOLN
INTRAMUSCULAR | Status: AC
  Filled 2015-06-19: qty 4

## 2015-06-19 MED ORDER — STERILE WATER FOR IRRIGATION IR SOLN
Status: DC | PRN
  Administered 2015-06-19: 2000 mL

## 2015-06-19 MED ORDER — FENTANYL CITRATE (PF) 250 MCG/5ML IJ SOLN
INTRAMUSCULAR | Status: AC
  Filled 2015-06-19: qty 25

## 2015-06-19 MED ORDER — DEXAMETHASONE SODIUM PHOSPHATE 10 MG/ML IJ SOLN
INTRAMUSCULAR | Status: DC | PRN
  Administered 2015-06-19: 10 mg via INTRAVENOUS

## 2015-06-19 MED ORDER — ACETAMINOPHEN 160 MG/5ML PO SOLN: 325.0000 mg | ORAL | Status: DC | PRN

## 2015-06-19 MED ORDER — FENTANYL CITRATE (PF) 250 MCG/5ML IJ SOLN
INTRAMUSCULAR | Status: DC | PRN
  Administered 2015-06-19: 50 ug via INTRAVENOUS
  Administered 2015-06-19: 25 ug via INTRAVENOUS
  Administered 2015-06-19: 50 ug via INTRAVENOUS

## 2015-06-19 MED ORDER — LACTATED RINGERS IR SOLN
Status: DC | PRN
  Administered 2015-06-19: 1000 mL

## 2015-06-19 MED ORDER — EVICEL 5 ML EX KIT
PACK | Freq: Once | CUTANEOUS | Status: AC
  Administered 2015-06-19: 1
  Filled 2015-06-19: qty 1

## 2015-06-19 MED ORDER — EPHEDRINE SULFATE 50 MG/ML IJ SOLN
INTRAMUSCULAR | Status: DC | PRN
  Administered 2015-06-19 (×4): 5 mg via INTRAVENOUS

## 2015-06-19 MED ORDER — PROPOFOL 10 MG/ML IV BOLUS
INTRAVENOUS | Status: AC
  Filled 2015-06-19: qty 20

## 2015-06-19 MED ORDER — HEPARIN SODIUM (PORCINE) 5000 UNIT/ML IJ SOLN
5000.0000 [IU] | INTRAMUSCULAR | Status: AC
  Administered 2015-06-19: 5000 [IU] via SUBCUTANEOUS
  Filled 2015-06-19 (×2): qty 1

## 2015-06-19 MED ORDER — HYDROMORPHONE HCL 1 MG/ML IJ SOLN
0.2500 mg | INTRAMUSCULAR | Status: DC | PRN
  Administered 2015-06-19 (×2): 0.5 mg via INTRAVENOUS

## 2015-06-19 MED ORDER — LACTATED RINGERS IV SOLN
INTRAVENOUS | Status: DC | PRN
  Administered 2015-06-19: 07:00:00 via INTRAVENOUS

## 2015-06-19 MED ORDER — CEFOXITIN SODIUM 2 G IV SOLR
2.0000 g | INTRAVENOUS | Status: AC
  Administered 2015-06-19 (×2): 2 g via INTRAVENOUS

## 2015-06-19 MED ORDER — ROCURONIUM BROMIDE 100 MG/10ML IV SOLN
INTRAVENOUS | Status: DC | PRN
  Administered 2015-06-19 (×2): 20 mg via INTRAVENOUS
  Administered 2015-06-19: 50 mg via INTRAVENOUS
  Administered 2015-06-19: 10 mg via INTRAVENOUS
  Administered 2015-06-19 (×2): 20 mg via INTRAVENOUS
  Administered 2015-06-19: 10 mg via INTRAVENOUS

## 2015-06-19 MED ORDER — OXYCODONE HCL 5 MG/5ML PO SOLN
5.0000 mg | ORAL | Status: DC | PRN
  Administered 2015-06-20: 10 mg via ORAL
  Filled 2015-06-19: qty 10

## 2015-06-19 MED ORDER — PROMETHAZINE HCL 25 MG/ML IJ SOLN
INTRAMUSCULAR | Status: AC
  Filled 2015-06-19: qty 1

## 2015-06-19 MED ORDER — LACTATED RINGERS IV SOLN
INTRAVENOUS | Status: DC
  Administered 2015-06-19: 11:00:00 via INTRAVENOUS

## 2015-06-19 MED ORDER — ONDANSETRON HCL 4 MG/2ML IJ SOLN
INTRAMUSCULAR | Status: DC | PRN
  Administered 2015-06-19: 4 mg via INTRAVENOUS

## 2015-06-19 MED ORDER — PREMIER PROTEIN SHAKE
2.0000 [oz_av] | Freq: Four times a day (QID) | ORAL | Status: DC
  Administered 2015-06-21 (×2): 2 [oz_av] via ORAL

## 2015-06-19 MED ORDER — LEVOTHYROXINE SODIUM 100 MCG IV SOLR
50.0000 ug | Freq: Every day | INTRAVENOUS | Status: DC
  Administered 2015-06-19 – 2015-06-21 (×3): 50 ug via INTRAVENOUS
  Filled 2015-06-19 (×3): qty 5

## 2015-06-19 MED ORDER — SODIUM CHLORIDE 0.9 % IJ SOLN
INTRAMUSCULAR | Status: AC
  Filled 2015-06-19: qty 10

## 2015-06-19 MED ORDER — ONDANSETRON HCL 4 MG/2ML IJ SOLN: 4.0000 mg | Freq: Once | INTRAMUSCULAR | Status: DC | PRN

## 2015-06-19 MED ORDER — PROMETHAZINE HCL 25 MG/ML IJ SOLN
6.2500 mg | INTRAMUSCULAR | Status: DC | PRN
  Administered 2015-06-19: 6.25 mg via INTRAVENOUS

## 2015-06-19 MED ORDER — GLYCOPYRROLATE 0.2 MG/ML IJ SOLN
INTRAMUSCULAR | Status: AC
Start: 2015-06-19 — End: 2015-06-19
  Filled 2015-06-19: qty 3

## 2015-06-19 MED ORDER — MIDAZOLAM HCL 2 MG/2ML IJ SOLN
INTRAMUSCULAR | Status: DC | PRN
  Administered 2015-06-19: 2 mg via INTRAVENOUS

## 2015-06-19 MED ORDER — PHENYLEPHRINE 40 MCG/ML (10ML) SYRINGE FOR IV PUSH (FOR BLOOD PRESSURE SUPPORT)
PREFILLED_SYRINGE | INTRAVENOUS | Status: AC
  Filled 2015-06-19: qty 10

## 2015-06-19 MED ORDER — SUGAMMADEX SODIUM 200 MG/2ML IV SOLN
INTRAVENOUS | Status: DC | PRN
  Administered 2015-06-19: 200 mg via INTRAVENOUS

## 2015-06-19 MED ORDER — MORPHINE SULFATE (PF) 2 MG/ML IV SOLN
2.0000 mg | INTRAVENOUS | Status: DC | PRN
  Administered 2015-06-19 – 2015-06-20 (×3): 2 mg via INTRAVENOUS
  Filled 2015-06-19 (×4): qty 1

## 2015-06-19 MED ORDER — BUPIVACAINE-EPINEPHRINE 0.25% -1:200000 IJ SOLN
INTRAMUSCULAR | Status: DC | PRN
  Administered 2015-06-19: 20 mL

## 2015-06-19 SURGICAL SUPPLY — 92 items
APL SRG 32X5 SNPLK LF DISP (MISCELLANEOUS)
APPLICATOR COTTON TIP 6IN STRL (MISCELLANEOUS) ×4 IMPLANT
APPLIER CLIP ROT 10 11.4 M/L (STAPLE)
APPLIER CLIP ROT 13.4 12 LRG (CLIP)
APR CLP LRG 13.4X12 ROT 20 MLT (CLIP)
APR CLP MED LRG 11.4X10 (STAPLE)
BAG SPEC RTRVL LRG 6X4 10 (ENDOMECHANICALS)
BLADE SURG SZ11 CARB STEEL (BLADE) ×4 IMPLANT
CABLE HIGH FREQUENCY MONO STRZ (ELECTRODE) ×4 IMPLANT
CHLORAPREP W/TINT 26ML (MISCELLANEOUS) ×6 IMPLANT
CLIP APPLIE ROT 10 11.4 M/L (STAPLE) IMPLANT
CLIP APPLIE ROT 13.4 12 LRG (CLIP) IMPLANT
CLIP SUT LAPRA TY ABSORB (SUTURE) ×8 IMPLANT
COVER SURGICAL LIGHT HANDLE (MISCELLANEOUS) IMPLANT
CUTTER FLEX LINEAR 45M (STAPLE) ×4 IMPLANT
DECANTER SPIKE VIAL GLASS SM (MISCELLANEOUS) ×4 IMPLANT
DEVICE SUT QUICK LOAD TK 5 (STAPLE) IMPLANT
DEVICE SUT TI-KNOT TK 5X26 (MISCELLANEOUS) IMPLANT
DEVICE SUTURE ENDOST 10MM (ENDOMECHANICALS) IMPLANT
DEVICE TI KNOT TK5 (MISCELLANEOUS)
DISSECTOR BLUNT TIP ENDO 5MM (MISCELLANEOUS) IMPLANT
DRAIN PENROSE 18X1/4 LTX STRL (WOUND CARE) ×4 IMPLANT
DRAPE CAMERA CLOSED 9X96 (DRAPES) ×4 IMPLANT
ELECT REM PT RETURN 9FT ADLT (ELECTROSURGICAL) ×4
ELECTRODE REM PT RTRN 9FT ADLT (ELECTROSURGICAL) ×2 IMPLANT
GAUZE SPONGE 4X4 12PLY STRL (GAUZE/BANDAGES/DRESSINGS) ×2 IMPLANT
GAUZE SPONGE 4X4 16PLY XRAY LF (GAUZE/BANDAGES/DRESSINGS) ×4 IMPLANT
GLOVE BIOGEL PI IND STRL 7.5 (GLOVE) ×2 IMPLANT
GLOVE BIOGEL PI INDICATOR 7.5 (GLOVE) ×2
GLOVE ECLIPSE 7.5 STRL STRAW (GLOVE) ×4 IMPLANT
GOWN STRL REUS W/TWL XL LVL3 (GOWN DISPOSABLE) ×12 IMPLANT
HEMOSTAT SURGICEL 4X8 (HEMOSTASIS) IMPLANT
HOVERMATT SINGLE USE (MISCELLANEOUS) ×4 IMPLANT
KIT BASIN OR (CUSTOM PROCEDURE TRAY) ×4 IMPLANT
KIT GASTRIC LAVAGE 34FR ADT (SET/KITS/TRAYS/PACK) ×4 IMPLANT
LIQUID BAND (GAUZE/BANDAGES/DRESSINGS) ×4 IMPLANT
LUBRICANT JELLY K Y 4OZ (MISCELLANEOUS) ×2 IMPLANT
NDL SPNL 22GX3.5 QUINCKE BK (NEEDLE) ×2 IMPLANT
NEEDLE SPNL 22GX3.5 QUINCKE BK (NEEDLE) ×4 IMPLANT
PACK CARDIOVASCULAR III (CUSTOM PROCEDURE TRAY) ×4 IMPLANT
PEN SKIN MARKING BROAD (MISCELLANEOUS) ×4 IMPLANT
POUCH SPECIMEN RETRIEVAL 10MM (ENDOMECHANICALS) IMPLANT
QUICK LOAD TK 5 (STAPLE)
RELOAD 45 VASCULAR/THIN (ENDOMECHANICALS) ×4 IMPLANT
RELOAD ENDO STITCH 2.0 (ENDOMECHANICALS)
RELOAD STAPLE 45 2.5 WHT GRN (ENDOMECHANICALS) ×2 IMPLANT
RELOAD STAPLE 45 3.5 BLU ETS (ENDOMECHANICALS) ×2 IMPLANT
RELOAD STAPLE 60 2.6 WHT THN (STAPLE) ×2 IMPLANT
RELOAD STAPLE 60 3.6 BLU REG (STAPLE) ×4 IMPLANT
RELOAD STAPLE 60 3.8 GOLD REG (STAPLE) ×2 IMPLANT
RELOAD STAPLE TA45 3.5 REG BLU (ENDOMECHANICALS) ×8 IMPLANT
RELOAD STAPLER BLUE 60MM (STAPLE) ×6 IMPLANT
RELOAD STAPLER GOLD 60MM (STAPLE) ×2 IMPLANT
RELOAD STAPLER WHITE 60MM (STAPLE) ×2 IMPLANT
RELOAD SUT SNGL STCH ABSRB 2-0 (ENDOMECHANICALS) IMPLANT
RELOAD SUT SNGL STCH BLK 2-0 (ENDOMECHANICALS) IMPLANT
SCISSORS LAP 5X45 EPIX DISP (ENDOMECHANICALS) ×4 IMPLANT
SEALANT SURGICAL APPL DUAL CAN (MISCELLANEOUS) ×2 IMPLANT
SET IRRIG TUBING LAPAROSCOPIC (IRRIGATION / IRRIGATOR) ×4 IMPLANT
SHEARS HARMONIC ACE PLUS 45CM (MISCELLANEOUS) ×4 IMPLANT
SLEEVE ADV FIXATION 12X100MM (TROCAR) ×8 IMPLANT
SOLUTION ANTI FOG 6CC (MISCELLANEOUS) ×4 IMPLANT
STAPLER ECHELON LONG 60 440 (INSTRUMENTS) ×4 IMPLANT
STAPLER RELOAD BLUE 60MM (STAPLE) ×12
STAPLER RELOAD GOLD 60MM (STAPLE) ×4
STAPLER RELOAD WHITE 60MM (STAPLE) ×4
SUT DEVICE BRAIDED 0X39 (SUTURE) IMPLANT
SUT DVC SILK 2.0X39 (SUTURE) ×20 IMPLANT
SUT DVC VICRYL PGA 2.0X39 (SUTURE) ×20 IMPLANT
SUT MNCRL AB 4-0 PS2 18 (SUTURE) ×4 IMPLANT
SUT RELOAD ENDO STITCH 2 48X1 (ENDOMECHANICALS)
SUT RELOAD ENDO STITCH 2.0 (ENDOMECHANICALS)
SUT SURGIDAC NAB ES-9 0 48 120 (SUTURE) IMPLANT
SUT VIC AB 2-0 SH 27 (SUTURE) ×4
SUT VIC AB 2-0 SH 27X BRD (SUTURE) IMPLANT
SUTURE RELOAD END STTCH 2 48X1 (ENDOMECHANICALS) IMPLANT
SUTURE RELOAD ENDO STITCH 2.0 (ENDOMECHANICALS) IMPLANT
SYR 10ML ECCENTRIC (SYRINGE) ×4 IMPLANT
SYR 20CC LL (SYRINGE) ×8 IMPLANT
SYR 50ML LL SCALE MARK (SYRINGE) ×4 IMPLANT
TOWEL OR 17X26 10 PK STRL BLUE (TOWEL DISPOSABLE) ×4 IMPLANT
TOWEL OR NON WOVEN STRL DISP B (DISPOSABLE) ×4 IMPLANT
TRAY FOLEY W/METER SILVER 14FR (SET/KITS/TRAYS/PACK) ×4 IMPLANT
TRAY FOLEY W/METER SILVER 16FR (SET/KITS/TRAYS/PACK) ×2 IMPLANT
TROCAR ADV FIXATION 12X100MM (TROCAR) ×4 IMPLANT
TROCAR ADV FIXATION 5X100MM (TROCAR) ×4 IMPLANT
TROCAR BLADELESS OPT 5 100 (ENDOMECHANICALS) ×4 IMPLANT
TROCAR XCEL 12X100 BLDLESS (ENDOMECHANICALS) ×4 IMPLANT
TUBING CONNECTING 10 (TUBING) ×1 IMPLANT
TUBING CONNECTING 10' (TUBING) ×1
TUBING ENDO SMARTCAP PENTAX (MISCELLANEOUS) ×4 IMPLANT
TUBING FILTER THERMOFLATOR (ELECTROSURGICAL) ×4 IMPLANT

## 2015-06-19 NOTE — Anesthesia Postprocedure Evaluation (Signed)
Anesthesia Post Note  Patient: [name]  Procedure(s) Performed: Procedure(s) (LRB): LAPAROSCOPIC ROUX-EN-Y GASTRIC BYPASS WITH UPPER ENDOSCOPY (N/A) UPPER GI ENDOSCOPY  Anesthesia type: general  Patient location: PACU  Post pain: Pain level controlled  Post assessment: Patient's Cardiovascular Status Stable  Last Vitals:  Filed Vitals:   06/19/15 1153  BP: 139/79  Pulse: 83  Temp: 36.4 C  Resp: 15    Post vital signs: Reviewed and stable  Level of consciousness: sedated  Complications: No apparent anesthesia complications

## 2015-06-19 NOTE — Transfer of Care (Signed)
Immediate Anesthesia Transfer of Care Note  Patient: [name]  Procedure(s) Performed: Procedure(s): LAPAROSCOPIC ROUX-EN-Y GASTRIC BYPASS WITH UPPER ENDOSCOPY (N/A) UPPER GI ENDOSCOPY  Patient Location: PACU  Anesthesia Type:General  Level of Consciousness: alert   Airway & Oxygen Therapy: Patient connected to face mask oxygen  Post-op Assessment: Post -op Vital signs reviewed and stable  Post vital signs: stable  Last Vitals:  Filed Vitals:   06/19/15 0534  BP: 145/71  Pulse: 73  Temp: 36.4 C  Resp: 18    Complications: No apparent anesthesia complications

## 2015-06-19 NOTE — H&P (Signed)
History of Present Illness [name] [name] [name]; 06/08/2015 12:19 PM) Patient words: bariatric.  The patient is a 74 year old female who presents for a bariatric surgery evaluation. She returns for her preoperative visit prior to planned laparoscopic Roux-en-Y gastric bypass. The patient gives a history of progressive obesity for about the past 10 or 15 years despite multiple attempts at medical management. Obesity has been affecting the patient in a number of ways including inability to enjoy routine activities with her family due to fatigue and shortness of breath. She needs right knee replacement but is not felt to be a good candidate until she can get some weight off. Significant co-morbid illnesses have developed including osteoarthritis, GERD, hypertension and elevated cholesterol. Also some urinary incontinence.. She has successfully completed her preoperative workup prior to planned laparoscopic gastric bypass. No concerns on cycle nutrition evaluation. She was actually able to lose 30 pounds on her preoperative diet. We reviewed her preoperative lab work and x-rays. This did show a UTI with Escherichia coli and we will treat her with 1 week of Bactrim preoperatively. Upper GI series showed just a small hiatal hernia.    Problem List/Past Medical [name], [name]; 06/08/2015 12:19 PM) MORBID OBESITY (E66.01)  Other Problems [name], [name]; 06/08/2015 12:19 PM) Thyroid Disease Heart murmur Gastroesophageal Reflux Disease Hypercholesterolemia High blood pressure Cholelithiasis Arthritis Bladder Problems Back Pain  Past Surgical History [name], [name]; 06/08/2015 12:19 PM) Spinal Surgery - Lower Back Knee Surgery Left. Gallbladder Surgery - Open Colon Polyp Removal - Colonoscopy  Diagnostic Studies History [name], [name]; 06/08/2015 12:19 PM) Mammogram 1-3 years ago Colonoscopy 1-5 years ago  Allergies [name],  [name]; 06/08/2015 11:43 AM) No Known Drug Allergies12/04/2014  Medication History ([name], [name]; 06/08/2015 11:43 AM) Protonix (40MG  Tablet DR, 1 (one) Tablet Oral daily, Taken starting 06/08/2015) Active. Escitalopram Oxalate (20MG  Tablet, Oral) Active. Levothyroxine Sodium (75MCG Tablet, Oral) Active. Omeprazole (20MG  Capsule DR, Oral) Active. Oxybutynin Chloride (5MG  Tablet, Oral) Active. Simvastatin (40MG  Tablet, Oral) Active. Valsartan-Hydrochlorothiazide (160-12.5MG  Tablet, Oral) Active. Vitamin C (500MG  Capsule, Oral) Active. Vantin (200MG  Tablet, Oral) Active. Aspirin EC (81MG  Tablet DR, Oral) Active. Phentermine HCl (37.5MG  Tablet, Oral) Active. Diovan HCT (160-12.5MG  Tablet, Oral) Active. Medications Reconciled  Social History [name], [name]; 06/08/2015 12:19 PM) Caffeine use Carbonated beverages. No drug use No alcohol use Tobacco use Never smoker.  Family History [name], [name]; 06/08/2015 12:19 PM) Cerebrovascular Accident Brother. Arthritis Brother, Mother, Sister. Thyroid problems Sister. Heart Disease Brother. Diabetes Mellitus Brother, Mother, Sister. Respiratory Condition Father. Hypertension Brother, Mother, Sister.  Pregnancy / Birth History [name], [name]; 06/08/2015 12:19 PM) Regular periods Para 1 Contraceptive History Oral contraceptives. Age at menarche 79 years. Maternal age 23-25 Gravida 2  Vitals ([place]; 06/08/2015 11:42 AM) 06/08/2015 11:42 AM Weight: 253.4 lb Height: 68in Body Surface Area: 2.26 m Body Mass Index: 38.53 kg/m  Temp.: 97.60F(Temporal)  Pulse: 68 (Regular)  BP: 124/74 (Sitting, Left Arm, Standard)       Physical Exam [name] [name]; 06/08/2015 12:20 PM) The physical exam findings are as follows: Note:General: Alert, obese Skin: Warm and dry without rash or infection. HEENT: No palpable masses or thyromegaly. Sclera  nonicteric. Lymph nodes: No cervical, supraclavicular, or inguinal nodes palpable. Lungs: Breath sounds clear and equal. No wheezing or increased work of breathing. Cardiovascular: Regular rate and rhythm without murmer. No JVD or edema. Peripheral pulses intact. Abdomen: Nondistended. Soft and nontender. No  masses palpable. No organomegaly. No palpable hernias. Well-healed right upper quadrant trocar incision without hernias Extremities: No edema or joint swelling or deformity. No chronic venous stasis changes. Neurologic: Alert and fully oriented. Gait normal. No focal weakness. Psychiatric: Normal mood and affect. Thought content appropriate with normal judgement and insight    Assessment & Plan [name] T. [name] [name]; 06/08/2015 12:22 PM) MORBID OBESITY (E66.01) Impression: Patient with progressive morbid obesity unresponsive to multiple efforts at medical management who presents with a BMI of 41.8 and comorbidities of degenerative joint disease, hypertension, GERD, and elevated cholesterol. I believe there would be very significant medical benefit from surgical weight loss. She is obviously on the upper age range of our patients but is robust and active and does not have any major cardiac or respiratory disease that would put her at excessive risk. She has just completed cardiac clearance and has a normal ECHO.  She has done very well with her preoperative diet and workup indicates no major issues. We are going to treat her UTI preoperatively. We reviewed the procedure today and went over the consent form and all her questions were answered. She is given prescription for pain medication and bowel prep as well. Current Plans Schedule for Surgery Laparoscopic Roux-en-Y gastric bypass  Started Bactrim DS 800-160MG , 1 (one) Tablet two times daily, #14, 06/08/2015, No Refill.

## 2015-06-19 NOTE — Op Note (Signed)
Preop diagnosis: Morbid obesity  Postop diagnosis: Morbid obesity  Body mass index is 37.08 kg/(m^2).  Comorbidities: Insulin-dependent diabetes mellitus, degenerative joint disease, GERD  Surgical procedure: Laparoscopic Roux-en-Y gastric bypass  Surgeon: Marland Kitchen T.Santresa Levett M.D.  Asst.: Greer Pickerel M.D.  Anesthesia: General  Complications:  None  EBL: Minimal  Drains: None  Disposition: PACU in good condition  Description of procedure: Patient is brought to the operating room and general anesthesia induced. She had received preoperative broad-spectrum IV antibiotics and subcutaneous heparin. The abdomen was widely sterilely prepped and draped. Patient timeout was performed and correct patient and procedure confirmed. Access was obtained with a 12 mm Optiview trocar in the left upper quadrant and pneumoperitoneum established without difficulty. Under direct vision 12 mm trocars were placed laterally in the right upper quadrant, right upper quadrant midclavicular line, and to the left and above the umbilicus for the camera port. A 5 mm trocar was placed laterally in the left upper quadrant. The omentum was brought into the upper abdomen and the transverse mesocolon elevated and the ligament of Treitz clearly identified. A 40 cm biliopancreatic limb was then carefully measured from the ligament of Treitz. The small intestine was divided at this point with a single firing of the white load linear stapler. A Penrose drain was sutured to the end of the Roux-en-Y limb for later identification. A 100 cm Roux-en-Y limb was then carefully measured. At this point a side-to-side anastomosis was created between the Roux limb and the end of the biliopancreatic limb. This was accomplished with a single firing of the 45 mm white load linear stapler. The common enterotomy was closed with a running 2-0 Vicryl begun at either end of the enterotomy and tied centrally. The mesenteric defect was then closed with  running 2-0 silk. The omentum was then divided with the harmonic scalpel up towards the transverse colon to allow mobility of the Roux limb toward the gastric pouch. The patient was then placed in steep reversed Trendelenburg. Through a 5 mm subxiphoid site the [facility] retractor was placed and the left lobe of the liver elevated with excellent exposure of the upper stomach and hiatus. The angle of Hiss was then mobilized with the harmonic scalpel. A 4 cm gastric pouch was then carefully measured along the lesser curve of the stomach. Dissection was carried along the lesser curve at this point with the Harmonic scalpel working carefully back toward the lesser sac at right angles to the lesser curve. The free lesser sac was then entered. After being sure all tubes were removed from the stomach an initial firing of the gold load 60 mm linear stapler was fired at right angles across the lesser curve for about 4 cm. The gastric pouch was further mobilized posteriorly and then the pouch was completed with 2 further firings of the 60 mm blue load linear stapler up through the previously dissected angle of His. It was ensured that the pouch was completely mobilized away from the gastric remnant. This created a nice tubular 4-5 cm gastric pouch. The staple line of the gastric remnant was then oversewn with 2-0 silk for hemostasis. The Roux limb was then brought up in an antecolic fashion with the candycane facing to the patient's left without undue tension. The gastrojejunostomy was created with an initial posterior row of 2-0 Vicryl between the Roux limb and the staple line of the gastric pouch. Enterotomies were then made in the gastric pouch and the Roux limb with the harmonic scalpel and at  approximately 2-2-1/2 cm anastomosis was created with a single firing of the blue load linear stapler. The staple line was inspected and was intact without bleeding. The common enterotomy was then closed with running 2-0 Vicryl  begun at either end and tied centrally. The wall tube was then easily passed through the anastomosis and an outer anterior layer of running 2-0 Vicryl was placed. The Ewald tube was removed. With the outlet of the gastrojejunostomy clamped and under saline irrigation the assistant performed upper endoscopy and with the gastric pouch tensely distended with air there was no evidence of leak. The pouch was desufflated. The Terance Hart defect was closed with running 2-0 silk. The abdomen was inspected for any evidence of bleeding or bowel injury and everything looked fine. The Nathanson retractor was removed under direct vision after coating the anastomosis with Tisseel tissue sealant. All CO2 was evacuated and trochars removed. Skin incisions were closed with staples. Sponge needle and instrument counts were correct. The patient was taken to the PACU in good condition.     Edward Jolly MD, FACS  06/19/2015, 10:26 AM

## 2015-06-19 NOTE — Op Note (Signed)
[name] [id number] 03/14/1941 06/19/2015  Preoperative diagnosis: morbid obesity  Postoperative diagnosis: Same   Procedure: Upper endoscopy   Surgeon: Gayland Curry M.D., FACS   Anesthesia: Gen.   Indications for procedure: 74 yo female undergoing a laparoscopic roux en y gastric bypass and an upper endoscopy was requested to evaluate the anastomosis.  Description of procedure: After we have completed the new gastrojejunostomy, I scrubbed out and obtained the Olympus endoscope. I gently placed endoscope in the patient's oropharynx and gently glided it down the esophagus without any difficulty under direct visualization. Once I was in the gastric pouch, I insufflated the pouch was air. The pouch was approximately 5cm (ge junction at 43 cm, anastomosis 48 cm) in size. I was able to cannulate and advanced the scope through the gastrojejunostomy. Dr. Excell Seltzer had placed saline in the upper abdomen. Upon further insufflation of the gastric pouch there was no evidence of bubbles. Upon further inspection of the gastric pouch, the mucosa appeared normal. There is no evidence of any mucosal abnormality. The gastric pouch and Roux limb were decompressed. The width of the gastrojejunal anastomosis was at least 2.5 cm. The scope was withdrawn. The patient tolerated this portion of the procedure well. Please see Dr Lear Ng operative note for details regarding the laparoscopic roux-en-y gastric bypass.  Leighton Ruff. Redmond Pulling, MD, FACS General, Bariatric, & Minimally Invasive Surgery [facility], [place]

## 2015-06-19 NOTE — Anesthesia Procedure Notes (Signed)
Procedure Name: Intubation Date/Time: 06/19/2015 7:34 AM Performed by: Pilar Grammes Pre-anesthesia Checklist: Patient identified, Emergency Drugs available, Suction available, Patient being monitored and Timeout performed Patient Re-evaluated:Patient Re-evaluated prior to inductionOxygen Delivery Method: Circle system utilized Preoxygenation: Pre-oxygenation with 100% oxygen Intubation Type: IV induction Ventilation: Mask ventilation without difficulty Laryngoscope Size: Mac and 4 Grade View: Grade I Tube type: Oral Tube size: 7.5 mm Number of attempts: 1 Airway Equipment and Method: Stylet Placement Confirmation: positive ETCO2,  ETT inserted through vocal cords under direct vision,  CO2 detector and breath sounds checked- equal and bilateral Secured at: 23 cm Tube secured with: Tape Dental Injury: Teeth and Oropharynx as per pre-operative assessment

## 2015-06-19 NOTE — Interval H&P Note (Signed)
History and Physical Interval Note:  06/19/2015 7:01 AM  [name]  has presented today for surgery, with the diagnosis of Morbid Obesity  The various methods of treatment have been discussed with the patient and family. After consideration of risks, benefits and other options for treatment, the patient has consented to  Procedure(s): LAPAROSCOPIC ROUX-EN-Y GASTRIC BYPASS WITH UPPER ENDOSCOPY/HIATAL HERNIA REPAIR (N/A) as a surgical intervention .  The patient's history has been reviewed, patient examined, no change in status, stable for surgery.  I have reviewed the patient's chart and labs.  Questions were answered to the patient's satisfaction.     Andrzej Scully T

## 2015-06-19 NOTE — Transfer of Care (Signed)
Immediate Anesthesia Transfer of Care Note  Patient: [name]  Procedure(s) Performed: Procedure(s): LAPAROSCOPIC ROUX-EN-Y GASTRIC BYPASS WITH UPPER ENDOSCOPY (N/A) UPPER GI ENDOSCOPY  Patient Location: PACU  Anesthesia Type:General  Level of Consciousness: alert   Airway & Oxygen Therapy: Patient connected to face mask oxygen  Post-op Assessment: Post -op Vital signs reviewed and stable  Post vital signs: stable  Last Vitals:  Filed Vitals:   06/19/15 1040  BP: 144/77  Pulse: 88  Temp: 36.4 C  Resp: 20    Complications: No apparent anesthesia complications

## 2015-06-19 NOTE — Anesthesia Preprocedure Evaluation (Addendum)
Anesthesia Evaluation  Patient identified by MRN, date of birth, ID band Patient awake    Reviewed: Allergy & Precautions, NPO status , Patient's Chart, lab work & pertinent test results  History of Anesthesia Complications (+) PONV  Airway Mallampati: II  TM Distance: >3 FB Neck ROM: Full    Dental   Pulmonary    Pulmonary exam normal        Cardiovascular hypertension, Pt. on medications Normal cardiovascular exam+ Valvular Problems/Murmurs AI   ECHO 06/2015 LV EF: 55%  ------------------------------------------------------------------- Indications:   Murmur (R01.1).  ------------------------------------------------------------------- History:  PMH: Acquired from the patient and from the patient&'s chart. Chronic exertional dyspnea and murmur. Rheumatic fever. Risk factors: Hypertension. Morbidly obese. Dyslipidemia.  ------------------------------------------------------------------- Study Conclusions  - Left ventricle: Abnormal septal motion The cavity size was normal. Systolic function was normal. The estimated ejection fraction was 55%. Wall motion was normal; there were no regional wall motion abnormalities. Left ventricular diastolic function parameters were normal. - Aortic valve: There was mild regurgitation. - Left atrium: The atrium was mildly dilated. - Atrial septum: No defect or patent foramen ovale was identified.     Neuro/Psych Anxiety    GI/Hepatic GERD  Medicated and Controlled,  Endo/Other    Renal/GU      Musculoskeletal   Abdominal   Peds  Hematology   Anesthesia Other Findings   Reproductive/Obstetrics                            Anesthesia Physical Anesthesia Plan  ASA: III  Anesthesia Plan: General   Post-op Pain Management:    Induction: Intravenous  Airway Management Planned: Oral ETT  Additional Equipment:   Intra-op Plan:    Post-operative Plan: Extubation in OR  Informed Consent: I have reviewed the patients History and Physical, chart, labs and discussed the procedure including the risks, benefits and alternatives for the proposed anesthesia with the patient or authorized representative who has indicated his/her understanding and acceptance.     Plan Discussed with: CRNA and Surgeon  Anesthesia Plan Comments:         Anesthesia Quick Evaluation

## 2015-06-20 ENCOUNTER — Inpatient Hospital Stay (HOSPITAL_COMMUNITY): Payer: Medicare Other

## 2015-06-20 LAB — HEMOGLOBIN AND HEMATOCRIT, BLOOD
HEMATOCRIT: 41.4 % (ref 36.0–46.0)
Hemoglobin: 13.4 g/dL (ref 12.0–15.0)

## 2015-06-20 LAB — CBC WITH DIFFERENTIAL/PLATELET
BASOS PCT: 0 %
Basophils Absolute: 0 10*3/uL (ref 0.0–0.1)
Eosinophils Absolute: 0 10*3/uL (ref 0.0–0.7)
Eosinophils Relative: 0 %
HEMATOCRIT: 40.8 % (ref 36.0–46.0)
HEMOGLOBIN: 13.4 g/dL (ref 12.0–15.0)
LYMPHS ABS: 1.1 10*3/uL (ref 0.7–4.0)
LYMPHS PCT: 14 %
MCH: 31.7 pg (ref 26.0–34.0)
MCHC: 32.8 g/dL (ref 30.0–36.0)
MCV: 96.5 fL (ref 78.0–100.0)
MONOS PCT: 11 %
Monocytes Absolute: 0.9 10*3/uL (ref 0.1–1.0)
NEUTROS ABS: 6.2 10*3/uL (ref 1.7–7.7)
NEUTROS PCT: 75 %
Platelets: 154 10*3/uL (ref 150–400)
RBC: 4.23 MIL/uL (ref 3.87–5.11)
RDW: 13.5 % (ref 11.5–15.5)
WBC: 8.1 10*3/uL (ref 4.0–10.5)

## 2015-06-20 MED ORDER — IOHEXOL 300 MG/ML  SOLN
50.0000 mL | Freq: Once | INTRAMUSCULAR | Status: DC | PRN
  Administered 2015-06-20: 50 mL via ORAL
  Filled 2015-06-20: qty 50

## 2015-06-20 NOTE — Plan of Care (Signed)
Problem: Food- and Nutrition-Related Knowledge Deficit (NB-1.1) Goal: Nutrition education Formal process to instruct or train a patient/client in a skill or to impart knowledge to help patients/clients voluntarily manage or modify food choices and eating behavior to maintain or improve health. Outcome: Completed/Met Date Met:  06/20/15 Nutrition Education Note  Received consult for diet education per DROP protocol.   Discussed 2 week post op diet with pt. Emphasized that liquids must be non carbonated, non caffeinated, and sugar free. Fluid goals discussed. Pt to follow up with outpatient bariatric RD for further diet progression after 2 weeks. Multivitamins and minerals also reviewed. Teach back method used, pt expressed understanding, expect good compliance.   Diet: First 2 Weeks  You will see the nutritionist about two (2) weeks after your surgery. The nutritionist will increase the types of foods you can eat if you are handling liquids well:  If you have severe vomiting or nausea and cannot handle clear liquids lasting longer than 1 day, call your surgeon  Protein Shake  Drink at least 2 ounces of shake 5-6 times per day  Each serving of protein shakes (usually 8 - 12 ounces) should have a minimum of:  15 grams of protein  And no more than 5 grams of carbohydrate  Goal for protein each day:  Men = 80 grams per day  Women = 60 grams per day  Protein powder may be added to fluids such as non-fat milk or Lactaid milk or Soy milk (limit to 35 grams added protein powder per serving)   Hydration  Slowly increase the amount of water and other clear liquids as tolerated (See Acceptable Fluids)  Slowly increase the amount of protein shake as tolerated  Sip fluids slowly and throughout the day  May use sugar substitutes in small amounts (no more than 6 - 8 packets per day; i.e. Splenda)   Fluid Goal  The first goal is to drink at least 8 ounces of protein shake/drink per day (or as directed  by the nutritionist); some examples of protein shakes are Syntrax Nectar, Adkins Advantage, EAS Edge HP, and Unjury. See handout from pre-op Bariatric Education Class:  Slowly increase the amount of protein shake you drink as tolerated  You may find it easier to slowly sip shakes throughout the day  It is important to get your proteins in first  Your fluid goal is to drink 64 - 100 ounces of fluid daily  It may take a few weeks to build up to this  32 oz (or more) should be clear liquids  And  32 oz (or more) should be full liquids (see below for examples)  Liquids should not contain sugar, caffeine, or carbonation   Clear Liquids:  Water or Sugar-free flavored water (i.e. Fruit H2O, Propel)  Decaffeinated coffee or tea (sugar-free)  Crystal Lite, Wyler's Lite, Minute Maid Lite  Sugar-free Jell-O  Bouillon or broth  Sugar-free Popsicle: *Less than 20 calories each; Limit 1 per day   Full Liquids:  Protein Shakes/Drinks + 2 choices per day of other full liquids  Full liquids must be:  No More Than 12 grams of Carbs per serving  No More Than 3 grams of Fat per serving  Strained low-fat cream soup  Non-Fat milk  Fat-free Lactaid Milk  Sugar-free yogurt (Dannon Lite & Fit, Greek yogurt)     Natash Berman, MS, RD, LDN Pager: [phone] After Hours Pager: [phone]        

## 2015-06-20 NOTE — Progress Notes (Signed)
Dr. Excell Seltzer made aware of patient's KUB results. Said it's OK to begin POD # 1 diet. Vwilliams,rn.

## 2015-06-20 NOTE — Progress Notes (Signed)
Pt completed progression walks x4 with out difficulty. No c/o of pain at this time. IVF infusing without difficulty. Will continue to monitor per Doctor order and unit protocol.

## 2015-06-20 NOTE — Progress Notes (Signed)
Pt began POD # 1 gastric diet with liquid (water) and ice chips. Has tolerated diet throughout shift with no problems. Vwilliams, rn.

## 2015-06-20 NOTE — Progress Notes (Signed)
[name], female   DOB: [birth year], 74 y.o.   MRN: [id number] 1 Day Post-Op  Subjective: Without complaints this morning. Mild soreness. No nausea. Has been ambulatory.  Objective: Vital signs in last 24 hours: Temp:  [97.5 F (36.4 C)-98.4 F (36.9 C)] 98.4 F (36.9 C) (11/16 0522) Pulse Rate:  [74-88] 74 (11/16 0522) Resp:  [13-20] 18 (11/16 0522) BP: (120-144)/(53-79) 132/71 mmHg (11/16 0522) SpO2:  [93 %-99 %] 97 % (11/16 0522)    Intake/Output from previous day: 11/15 0701 - 11/16 0700 In: 3998.3 [I.V.:3998.3] Out: 1605 [Urine:1580; Blood:25] Intake/Output this shift:    General appearance: alert, cooperative and no distress Resp: clear to auscultation bilaterally GI: normal findings: soft, non-tender Incision/Wound: clean and dry  Lab Results:   Recent Labs  06/19/15 1800 06/20/15 0500  WBC  --  8.1  HGB 14.8 13.4  HCT 44.2 40.8  PLT  --  154   BMET No results for input(s): NA, K, CL, CO2, GLUCOSE, BUN, CREATININE, CALCIUM in the last 72 hours.   Studies/Results: No results found.  Anti-infectives: Anti-infectives    Start     Dose/Rate Route Frequency Ordered Stop   06/19/15 0615  cefOXitin (MEFOXIN) 2 g in dextrose 5 % 50 mL IVPB     2 g 100 mL/hr over 30 Minutes Intravenous On call to O.R. 06/19/15 0615 06/19/15 0938      Assessment/Plan: s/p Procedure(s): LAPAROSCOPIC ROUX-EN-Y GASTRIC BYPASS WITH UPPER ENDOSCOPY UPPER GI ENDOSCOPY Doing very well without apparent complication. For swallow study this morning.   LOS: 1 day    Debbie Bellucci T 06/20/2015

## 2015-06-20 NOTE — Progress Notes (Signed)
Patient alert and oriented, Post op day 1.  Provided support and encouragement.  Encouraged pulmonary toilet, ambulation and small sips of liquids.  All questions answered.  Will continue to monitor. 

## 2015-06-21 LAB — CBC WITH DIFFERENTIAL/PLATELET
BASOS ABS: 0 10*3/uL (ref 0.0–0.1)
BASOS PCT: 0 %
Eosinophils Absolute: 0 10*3/uL (ref 0.0–0.7)
Eosinophils Relative: 0 %
HEMATOCRIT: 41 % (ref 36.0–46.0)
HEMOGLOBIN: 13.2 g/dL (ref 12.0–15.0)
LYMPHS PCT: 21 %
Lymphs Abs: 1.4 10*3/uL (ref 0.7–4.0)
MCH: 32 pg (ref 26.0–34.0)
MCHC: 32.2 g/dL (ref 30.0–36.0)
MCV: 99.5 fL (ref 78.0–100.0)
Monocytes Absolute: 0.6 10*3/uL (ref 0.1–1.0)
Monocytes Relative: 9 %
NEUTROS ABS: 4.7 10*3/uL (ref 1.7–7.7)
NEUTROS PCT: 70 %
Platelets: 147 10*3/uL — ABNORMAL LOW (ref 150–400)
RBC: 4.12 MIL/uL (ref 3.87–5.11)
RDW: 13.7 % (ref 11.5–15.5)
WBC: 6.7 10*3/uL (ref 4.0–10.5)

## 2015-06-21 NOTE — Progress Notes (Signed)
[name], female   DOB: [birth year], 74 y.o.   MRN: [id number] 2 Days Post-Op  Subjective: Doing "very well". Denies pain or nausea. Ambulatory. Tolerating water well.  Objective: Vital signs in last 24 hours: Temp:  [98.1 F (36.7 C)-98.9 F (37.2 C)] 98.2 F (36.8 C) (11/17 0500) Pulse Rate:  [61-75] 74 (11/17 0500) Resp:  [18] 18 (11/17 0500) BP: (126-151)/(55-75) 142/75 mmHg (11/17 0500) SpO2:  [94 %-96 %] 96 % (11/17 0500)    Intake/Output from previous day: 11/16 0701 - 11/17 0700 In: 2300 [I.V.:2300] Out: 750 [Urine:750] Intake/Output this shift:    General appearance: alert, cooperative and no distress GI: normal findings: soft, non-tender Incision/Wound: clean and dry  Lab Results:   Recent Labs  06/20/15 0500 06/20/15 1622 06/21/15 0430  WBC 8.1  --  6.7  HGB 13.4 13.4 13.2  HCT 40.8 41.4 41.0  PLT 154  --  147*   BMET No results for input(s): NA, K, CL, CO2, GLUCOSE, BUN, CREATININE, CALCIUM in the last 72 hours.   Studies/Results: Dg Ugi W/water Sol Cm  06/20/2015  CLINICAL DATA:  Initial encounter for status post gastric bypass. EXAM: WATER SOLUBLE UPPER GI SERIES TECHNIQUE: Single-column upper GI series was performed using water soluble contrast. CONTRAST:  68mL OMNIPAQUE IOHEXOL 300 MG/ML  SOLN COMPARISON:  05/24/2015. FLUOROSCOPY TIME:  Radiation Exposure Index (as provided by the fluoroscopic device): If the device does not provide the exposure index: Fluoroscopy Time (in minutes and seconds):  1 minutes and 0 seconds Number of Acquired Images: FINDINGS: Pre-procedure KUB shows slight gaseous distention of colon. No evidence for gaseous small bowel dilatation. Patient was given 50 cc water-soluble contrast by mouth. Contrast material passes into the stomach and readily flows through the gastroenteric anastomosis. No evidence for contrast extravasation to suggest leak at the gastrojejunal anastomosis. Contrast flows distally into  jejunal loops which are nondilated. Only limited peristalsis is evident in the distal small bowel anastomosis could not be visualized. IMPRESSION: Unobstructed flow of contrast material through the gastroenteric anastomosis without evidence for contrast leak. No evidence for dilatation of the efferent limb. Limited peristalsis of small bowel loops. Electronically Signed   By: Misty Stanley M.D.   On: 06/20/2015 10:48    Anti-infectives: Anti-infectives    Start     Dose/Rate Route Frequency Ordered Stop   06/19/15 0615  cefOXitin (MEFOXIN) 2 g in dextrose 5 % 50 mL IVPB     2 g 100 mL/hr over 30 Minutes Intravenous On call to O.R. 06/19/15 0615 06/19/15 0938      Assessment/Plan: s/p Procedure(s): LAPAROSCOPIC ROUX-EN-Y GASTRIC BYPASS WITH UPPER ENDOSCOPY UPPER GI ENDOSCOPY Doing very well without apparent complication. Advanced to protein shake diet and expected discharge later this morning.   LOS: 2 days    Esha Fincher T 06/21/2015

## 2015-06-21 NOTE — Progress Notes (Signed)
Patient alert and oriented, pain is controlled. Patient is tolerating fluids,  advanced to protein shake today, patient tolerated well. Reviewed Gastric Bypass discharge instructions with patient and patient is able to articulate understanding. Provided information on BELT program, Support Group and [facility] outpatient pharmacy. All questions answered, will continue to monitor.    

## 2015-06-21 NOTE — Progress Notes (Signed)
Assessment unchanged. Pt verbalized understanding of dc instructions as given by Vilinda Flake, RN earlier. Further instructions given including follow up care and when to call the doctor with verbalized understanding through teach back. Discharged via wc to front entrance to meet awaiting vehicle to carry home. Accompanied by family member and NT.

## 2015-06-21 NOTE — Discharge Instructions (Signed)

## 2015-06-21 NOTE — Discharge Summary (Signed)
   [name] [id number] 74 y.o. 06/15/1941  06/19/2015  Discharge date and time: 06/21/2015   Admitting Physician: Excell Seltzer T  Discharge Physician: Excell Seltzer T  Admission Diagnoses: Morbid Obesity  Discharge Diagnoses: same  Operations: Procedure(s): LAPAROSCOPIC ROUX-EN-Y GASTRIC BYPASS WITH UPPER ENDOSCOPY UPPER GI ENDOSCOPY  Admission Condition: good  Discharged Condition: good  Indication for Admission: patient is a 74 year old female with long-standing morbid obesity unresponsive to medical management who presents with a BMI of 42 with comorbidities of chronic joint pain, hypertension, GERD and elevated cholesterol. After extensive preoperative workup and discussion detailed elsewhere she is electively admitted for gastric bypass for treatment of her morbid obesity.  Hospital Course: on the morning of admission the patient underwent an uneventful gastric bypass. Her postoperative course was uncomplicated. Gastrografin swallow on postoperative day 1 showed no abnormalities. She was started on water and ice chips which she tolerated well. She had minimal pain. On the second postoperative day her vital signs and CBC are normal. She feels well. Abdomen is benign. Tolerating protein shakes. She is felt ready for discharge.  Disposition: Home  Patient Instructions:    Medication List    TAKE these medications        acetaminophen 325 MG tablet  Commonly known as:  TYLENOL  Take 325 mg by mouth every 6 (six) hours as needed for mild pain or headache.     Calcium-D 600-400 MG-UNIT Tabs  Take 1 tablet by mouth 3 (three) times daily.     CHEWABLES MULTIVITAMIN PO  Take 1 tablet by mouth 2 (two) times daily.     DIOVAN HCT 160-12.5 MG tablet  Generic drug:  valsartan-hydrochlorothiazide  Take 1 tablet by mouth daily.     escitalopram 20 MG tablet  Commonly known as:  LEXAPRO  Take 20 mg by mouth daily.     levothyroxine 88 MCG  tablet  Commonly known as:  SYNTHROID, LEVOTHROID  Take 88 mcg by mouth at bedtime.     magnesium hydroxide 400 MG/5ML suspension  Commonly known as:  MILK OF MAGNESIA  Take 30 mLs by mouth daily as needed for mild constipation.     omeprazole 20 MG capsule  Commonly known as:  PRILOSEC  Take 20 mg by mouth daily.     oxybutynin 5 MG tablet  Commonly known as:  DITROPAN  Take 5 mg by mouth 2 (two) times daily.     simvastatin 40 MG tablet  Commonly known as:  ZOCOR  Take 40 mg by mouth at bedtime.     Vitamin B-12 2500 MCG Subl  Place 2,500 mcg under the tongue daily.     vitamin C 500 MG tablet  Commonly known as:  ASCORBIC ACID  Take 500 mg by mouth 2 (two) times daily.     Vitamin D3 5000 UNITS Caps  Take 5,000 Units by mouth daily.        Activity: activity as tolerated Diet: bariatric protein shakes Wound Care: none needed  Follow-up:  With Dr. Excell Seltzer in 3 weeks.  Signed: Edward Jolly MD, FACS  06/21/2015, 8:37 AM

## 2015-06-25 ENCOUNTER — Telehealth (HOSPITAL_COMMUNITY): Payer: Self-pay

## 2015-06-25 NOTE — Telephone Encounter (Addendum)
Attempted DROP discharge call, no answer, unable to leave message, will try again later  Made discharge phone call to patient per DROP protocol. Asking the following questions.    1. Do you have someone to care for you now that you are home?  yes 2. Are you having pain now that is not relieved by your pain medication?  no 3. Are you able to drink the recommended daily amount of fluids (48 ounces minimum/day) and protein (60-80 grams/day) as prescribed by the dietitian or nutritional counselor?  yes 4. Are you taking the vitamins and minerals as prescribed?  yes 5. Do you have the "on call" number to contact your surgeon if you have a problem or question?  yes 6. Are your incisions free of redness, swelling or drainage? (If steri strips, address that these can fall off, shower as tolerated) yes 7. Have your bowels moved since your surgery?  If not, are you passing gas?  yes 8. Are you up and walking 3-4 times per day?  yes

## 2015-07-03 ENCOUNTER — Encounter: Payer: Medicare Other | Attending: General Surgery

## 2015-07-03 DIAGNOSIS — Z713 Dietary counseling and surveillance: Secondary | ICD-10-CM | POA: Insufficient documentation

## 2015-07-03 DIAGNOSIS — Z6838 Body mass index (BMI) 38.0-38.9, adult: Secondary | ICD-10-CM | POA: Insufficient documentation

## 2015-07-03 NOTE — Progress Notes (Signed)
Bariatric Class:  Appt start time: 1530 end time:  1630.  2 Week Post-Operative Nutrition Class  Patient was seen on 07/03/15 for Post-Operative Nutrition education at [facility].   Surgery date: 06/19/15 Surgery type: RYGB Start weight at [facility]: 281.5 lbs on 08/28/14 Weight today: 243 lbs  Weight change: 11 lbs Total weight loss: 38.5 lbs  TANITA  BODY COMP RESULTS  05/21/15 07/03/15   BMI (kg/m^2) 38.6 36.9   Fat Mass (lbs) 138.5 127.0   Fat Free Mass (lbs) 115.5 116.0   Total Body Water (lbs) 84.5 85.0    The following the learning objectives were met by the patient during this course:  Identifies Phase 3A (Soft, High Proteins) Dietary Goals and will begin from 2 weeks post-operatively to 2 months post-operatively  Identifies appropriate sources of fluids and proteins   States protein recommendations and appropriate sources post-operatively  Identifies the need for appropriate texture modifications, mastication, and bite sizes when consuming solids  Identifies appropriate multivitamin and calcium sources post-operatively  Describes the need for physical activity post-operatively and will follow MD recommendations  States when to call healthcare provider regarding medication questions or post-operative complications  Handouts given during class include:  Phase 3A: Soft, High Protein Diet Handout  Follow-Up Plan: Patient will follow-up at [facility] in 6 weeks for 2 month post-op nutrition visit for diet advancement per MD.

## 2015-07-24 ENCOUNTER — Telehealth: Payer: Self-pay | Admitting: Dietician

## 2015-07-24 ENCOUNTER — Telehealth: Payer: Self-pay | Admitting: *Deleted

## 2015-07-24 NOTE — Telephone Encounter (Signed)
Pt called and wanted to talk about what to add to her diet. Pt had bariatric surgery 5 wks ago and was wondering if she can add vegetables to her diet. Pt is complaining of some constipation issues. Pt is requesting a call back. [phone]. Thanks

## 2015-07-24 NOTE — Telephone Encounter (Signed)
Talked to [name] today about her constipation. Recommended that she have just one protein shake per day (instead of 2) since she is able to eat 30+ grams of protein each day. Encouraged her to continue getting in 64+ oz of fluid and check in with surgeon's office to ask what their recommendation for her was on adding Miralax.

## 2015-07-26 DIAGNOSIS — R300 Dysuria: Secondary | ICD-10-CM | POA: Diagnosis not present

## 2015-08-01 DIAGNOSIS — E782 Mixed hyperlipidemia: Secondary | ICD-10-CM | POA: Diagnosis not present

## 2015-08-01 DIAGNOSIS — I10 Essential (primary) hypertension: Secondary | ICD-10-CM | POA: Diagnosis not present

## 2015-08-01 DIAGNOSIS — E039 Hypothyroidism, unspecified: Secondary | ICD-10-CM | POA: Diagnosis not present

## 2015-08-01 DIAGNOSIS — R7301 Impaired fasting glucose: Secondary | ICD-10-CM | POA: Diagnosis not present

## 2015-08-01 DIAGNOSIS — E6609 Other obesity due to excess calories: Secondary | ICD-10-CM | POA: Diagnosis not present

## 2015-08-07 DIAGNOSIS — E6609 Other obesity due to excess calories: Secondary | ICD-10-CM | POA: Diagnosis not present

## 2015-08-07 DIAGNOSIS — Z6833 Body mass index (BMI) 33.0-33.9, adult: Secondary | ICD-10-CM | POA: Diagnosis not present

## 2015-08-15 ENCOUNTER — Encounter: Payer: Medicare Other | Attending: General Surgery | Admitting: Dietician

## 2015-08-15 DIAGNOSIS — Z6838 Body mass index (BMI) 38.0-38.9, adult: Secondary | ICD-10-CM | POA: Insufficient documentation

## 2015-08-15 DIAGNOSIS — Z713 Dietary counseling and surveillance: Secondary | ICD-10-CM | POA: Diagnosis not present

## 2015-08-15 NOTE — Patient Instructions (Addendum)
Goals:  Follow Phase 3B: High Protein + Non-Starchy Vegetables  Eat 3-6 small meals/snacks, every 3-5 hrs  Increase lean protein foods to meet 60g goal  Increase fluid intake to 64oz +  Avoid drinking 15 minutes before, during and 30 minutes after eating  Aim for >30 min of physical activity daily  As you add veggies, try to wean off Miralax  Try diet Cranberry juice by Ocean Spray (5 calories); dilute if you need to  Surgery date: 06/19/15 Surgery type: RYGB Start weight at [facility]: 281.5 lbs on 08/28/14 Weight today: 224.5 lbs Weight change: 18.5 lbs Total weight loss: 57 lbs  TANITA  BODY COMP RESULTS  05/21/15 07/03/15 08/15/15   BMI (kg/m^2) 38.6 36.9 34.1   Fat Mass (lbs) 138.5 127.0 116   Fat Free Mass (lbs) 115.5 116.0 108.5   Total Body Water (lbs) 84.5 85.0 79.5

## 2015-08-15 NOTE — Progress Notes (Signed)
  Follow-up visit:  8 Weeks Post-Operative RYGB Surgery  Medical Nutrition Therapy:  Appt start time: 0800 end time:  0825  Primary concerns today: Post-operative Bariatric Surgery Nutrition Management. [name] returns having lost 18.5 lbs since post op class 6 weeks ago. She reports she is feeling great! Tried a piece of lettuce and tolerated. Tolerating all recommended foods. She has noticed she is able to walk better; shoveled snow for an hour and half this past weekend. Got a fit bit and using app to log foods and exercise. Able to tolerate about 3 oz of meat at a time; uses food scale. Has not had any nausea, pain, or vomiting. Struggling with constipation and takes Miralax daily. Also has had a few yeast infections since surgery.   Surgery date: 06/19/15 Surgery type: RYGB Start weight at [facility]: 281.5 lbs on 08/28/14 Weight today: 224.5 lbs Weight change: 18.5 lbs Total weight loss: 57 lbs Weight loss goal: 170-180 lbs  TANITA  BODY COMP RESULTS  05/21/15 07/03/15 08/15/15   BMI (kg/m^2) 38.6 36.9 34.1   Fat Mass (lbs) 138.5 127.0 116   Fat Free Mass (lbs) 115.5 116.0 108.5   Total Body Water (lbs) 84.5 85.0 79.5    Preferred Learning Style:   No preference indicated   Learning Readiness:   Ready  24-hr recall: B (AM): Premier protein shake (30g) Snk (AM):   L (PM): [place] yogurt and cheese (18g) Snk (PM): more cheese or yogurt (6-12g)  D (PM): Hot dog or deli ham and cheese or rotisserie chicken meat (10g) Snk (PM): cheese (6g)  Fluid intake: 2 Powerade zeros (40 oz) + protein (11 oz) ; 51 oz a day   Estimated total protein intake: 70-80g/day  Medications: no longer on cholesterol or HTN medication Supplementation: taking  Using straws: no Drinking while eating: no Hair loss: none Carbonated beverages: none N/V/D/C: constipation, taking Miralax every morning Dumping syndrome: none  Recent physical activity:  Stationary bike 5 miles (30 minutes daily); plans to  join rec center gym in [place] Goal(s):  In progress.  Handouts given during visit include:  Phase 3B lean protein + non starchy vegetables   Nutritional Diagnosis:  [place]-3.3 Overweight/obesity related to past poor dietary habits and physical inactivity as evidenced by patient w/ recent RYGB surgery following dietary guidelines for continued weight loss.     Intervention:  Nutrition counseling provided. Goals:  Follow Phase 3B: High Protein + Non-Starchy Vegetables  Eat 3-6 small meals/snacks, every 3-5 hrs  Increase lean protein foods to meet 60g goal  Increase fluid intake to 64oz +  Avoid drinking 15 minutes before, during and 30 minutes after eating  Aim for >30 min of physical activity daily  As you add veggies, try to wean off Miralax  Try diet Cranberry juice by Ocean Spray (5 calories); dilute if you need to  Teaching Method Utilized:  Visual Auditory Hands on  Barriers to learning/adherence to lifestyle change: none  Demonstrated degree of understanding via:  Teach Back   Monitoring/Evaluation:  Dietary intake, exercise, and body weight. Follow up in 6 weeks for 3.5 month post-op visit.

## 2015-09-20 DIAGNOSIS — Z9884 Bariatric surgery status: Secondary | ICD-10-CM | POA: Diagnosis not present

## 2015-09-24 DIAGNOSIS — Z9884 Bariatric surgery status: Secondary | ICD-10-CM | POA: Diagnosis not present

## 2015-10-02 DIAGNOSIS — Z01419 Encounter for gynecological examination (general) (routine) without abnormal findings: Secondary | ICD-10-CM | POA: Diagnosis not present

## 2015-10-02 DIAGNOSIS — Z6833 Body mass index (BMI) 33.0-33.9, adult: Secondary | ICD-10-CM | POA: Diagnosis not present

## 2015-10-03 ENCOUNTER — Encounter: Payer: Medicare Other | Attending: General Surgery | Admitting: Dietician

## 2015-10-03 ENCOUNTER — Encounter: Payer: Self-pay | Admitting: Dietician

## 2015-10-03 DIAGNOSIS — Z6838 Body mass index (BMI) 38.0-38.9, adult: Secondary | ICD-10-CM | POA: Insufficient documentation

## 2015-10-03 DIAGNOSIS — Z713 Dietary counseling and surveillance: Secondary | ICD-10-CM | POA: Insufficient documentation

## 2015-10-03 NOTE — Progress Notes (Signed)
  Follow-up visit:  3.5 months Post-Operative RYGB Surgery  Medical Nutrition Therapy:  Appt start time: 1010 end time:  1030  Primary concerns today: Post-operative Bariatric Surgery Nutrition Management. [name] returns having lost another 12.5 lbs. She states she has come off blood pressure, cholesterol, and reflux medication. Bladder leakage has improved with weight loss. Has noticed that her weight loss has slowed down in the last week. Tracking foods on her fit bit program. States that she eats about 700-900 calories per day.  Surgery date: 06/19/15 Surgery type: RYGB Start weight at [facility]: 281.5 lbs on 08/28/14 Weight today: 212 lbs Weight change: 12.5 lbs Total weight loss: 69.5 lbs Weight loss goal: 170-180 lbs  TANITA  BODY COMP RESULTS  05/21/15 07/03/15 08/15/15 10/03/15   BMI (kg/m^2) 38.6 36.9 34.1 32.2   Fat Mass (lbs) 138.5 127.0 116 104   Fat Free Mass (lbs) 115.5 116.0 108.5 108   Total Body Water (lbs) 84.5 85.0 79.5 79    Preferred Learning Style:   No preference indicated   Learning Readiness:   Ready  24-hr recall:  Drinks 1 Premier protein shake per day  B (AM): Light and Fit yogurt with crunchies (12g) Snk (AM):  Cheese (6g) L (PM): salad with 3 oz grilled chicken and cheese (21g) Snk (PM): yogurt (12g)  D (PM): salad with [place] (21g) Snk (PM): cheese OR Weight Watchers ice cream bar occasionally  Fluid intake: 2 Powerade zeros (40 oz) + protein (11 oz) ; 51 oz a day   Estimated total protein intake: 70-80g/day  Medications: no longer on cholesterol, reflux, or HTN medication Supplementation: taking  Using straws: no Drinking while eating: no Hair loss: none Carbonated beverages: none N/V/D/C: constipation resolving, takes Miralax 1-2x a week Dumping syndrome: none  Recent physical activity:  Gym 3-4 days a week for at least 1 hour (weight training and treadmill for 30 minutes)  Progress Towards Goal(s):  In progress.  Handouts given  during visit include:  none   Nutritional Diagnosis:  [name]-3.3 Overweight/obesity related to past poor dietary habits and physical inactivity as evidenced by patient w/ recent RYGB surgery following dietary guidelines for continued weight loss.     Intervention:  Nutrition counseling provided. Goals:  Follow Phase 3B: High Protein + Non-Starchy Vegetables  Eat 3-6 small meals/snacks, every 3-5 hrs  Increase lean protein foods to meet 60g goal  Increase fluid intake to 64oz +  Avoid drinking 15 minutes before, during and 30 minutes after eating  Aim for >30 min of physical activity daily  Teaching Method Utilized:  Visual Auditory Hands on  Barriers to learning/adherence to lifestyle change: none  Demonstrated degree of understanding via:  Teach Back   Monitoring/Evaluation:  Dietary intake, exercise, and body weight. Follow up in 3 months for 6.5 month post-op visit.

## 2015-10-03 NOTE — Patient Instructions (Addendum)
Goals:  Follow Phase 3B: High Protein + Non-Starchy Vegetables  Eat 3-6 small meals/snacks, every 3-5 hrs  Increase lean protein foods to meet 60g goal  Increase fluid intake to 64oz +  Avoid drinking 15 minutes before, during and 30 minutes after eating  Aim for >30 min of physical activity daily  Surgery date: 06/19/15 Surgery type: RYGB Start weight at [facility]: 281.5 lbs on 08/28/14 Weight today: 212 lbs Weight change: 12.5 lbs Total weight loss: 69.5 lbs Weight loss goal: 170-180 lbs  TANITA  BODY COMP RESULTS  05/21/15 07/03/15 08/15/15 10/03/15   BMI (kg/m^2) 38.6 36.9 34.1 32.2   Fat Mass (lbs) 138.5 127.0 116 104   Fat Free Mass (lbs) 115.5 116.0 108.5 108   Total Body Water (lbs) 84.5 85.0 79.5 79

## 2015-10-12 DIAGNOSIS — H25813 Combined forms of age-related cataract, bilateral: Secondary | ICD-10-CM | POA: Diagnosis not present

## 2015-10-12 DIAGNOSIS — H43393 Other vitreous opacities, bilateral: Secondary | ICD-10-CM | POA: Diagnosis not present

## 2015-10-12 DIAGNOSIS — H5203 Hypermetropia, bilateral: Secondary | ICD-10-CM | POA: Diagnosis not present

## 2015-10-12 DIAGNOSIS — H35031 Hypertensive retinopathy, right eye: Secondary | ICD-10-CM | POA: Diagnosis not present

## 2015-10-12 DIAGNOSIS — H52223 Regular astigmatism, bilateral: Secondary | ICD-10-CM | POA: Diagnosis not present

## 2015-10-12 DIAGNOSIS — H43813 Vitreous degeneration, bilateral: Secondary | ICD-10-CM | POA: Diagnosis not present

## 2015-10-12 DIAGNOSIS — I10 Essential (primary) hypertension: Secondary | ICD-10-CM | POA: Diagnosis not present

## 2015-10-12 DIAGNOSIS — H524 Presbyopia: Secondary | ICD-10-CM | POA: Diagnosis not present

## 2015-11-28 DIAGNOSIS — Z1231 Encounter for screening mammogram for malignant neoplasm of breast: Secondary | ICD-10-CM | POA: Diagnosis not present

## 2015-12-03 ENCOUNTER — Other Ambulatory Visit: Payer: Self-pay | Admitting: Obstetrics and Gynecology

## 2015-12-03 DIAGNOSIS — R928 Other abnormal and inconclusive findings on diagnostic imaging of breast: Secondary | ICD-10-CM

## 2015-12-05 DIAGNOSIS — M545 Low back pain: Secondary | ICD-10-CM | POA: Diagnosis not present

## 2015-12-05 DIAGNOSIS — M546 Pain in thoracic spine: Secondary | ICD-10-CM | POA: Diagnosis not present

## 2015-12-05 DIAGNOSIS — M25562 Pain in left knee: Secondary | ICD-10-CM | POA: Diagnosis not present

## 2015-12-06 ENCOUNTER — Ambulatory Visit
Admission: RE | Admit: 2015-12-06 | Discharge: 2015-12-06 | Disposition: A | Discharge status: Home / Self Care | Payer: Medicare Other | Source: Ambulatory Visit | Attending: Obstetrics and Gynecology | Admitting: Obstetrics and Gynecology

## 2015-12-06 DIAGNOSIS — N6489 Other specified disorders of breast: Secondary | ICD-10-CM | POA: Diagnosis not present

## 2015-12-06 DIAGNOSIS — R928 Other abnormal and inconclusive findings on diagnostic imaging of breast: Secondary | ICD-10-CM

## 2016-01-01 DIAGNOSIS — H2511 Age-related nuclear cataract, right eye: Secondary | ICD-10-CM | POA: Diagnosis not present

## 2016-01-01 DIAGNOSIS — H18411 Arcus senilis, right eye: Secondary | ICD-10-CM | POA: Diagnosis not present

## 2016-01-01 DIAGNOSIS — H02839 Dermatochalasis of unspecified eye, unspecified eyelid: Secondary | ICD-10-CM | POA: Diagnosis not present

## 2016-01-01 DIAGNOSIS — H18412 Arcus senilis, left eye: Secondary | ICD-10-CM | POA: Diagnosis not present

## 2016-01-02 ENCOUNTER — Encounter: Payer: Medicare Other | Attending: General Surgery | Admitting: Dietician

## 2016-01-02 ENCOUNTER — Encounter: Payer: Self-pay | Admitting: Dietician

## 2016-01-02 DIAGNOSIS — Z6838 Body mass index (BMI) 38.0-38.9, adult: Secondary | ICD-10-CM | POA: Diagnosis not present

## 2016-01-02 DIAGNOSIS — Z713 Dietary counseling and surveillance: Secondary | ICD-10-CM | POA: Diagnosis not present

## 2016-01-02 NOTE — Patient Instructions (Addendum)
Goals:  Follow Phase 3B: High Protein + Non-Starchy Vegetables  Eat 3-6 small meals/snacks, every 3-5 hrs  Increase lean protein foods to meet 60g goal  Increase fluid intake to 64oz +  Avoid drinking 15 minutes before, during and 30 minutes after eating  Aim for >30 min of physical activity daily  Avoid weighing every day (aim for every other day ONLY in the morning)  Keep focusing on non scale victories   Surgery date: 06/19/15 Surgery type: RYGB Start weight at [facility]: 281.5 lbs on 08/28/14 Weight today: 196.8 lbs Weight change: 15.2 lbs Total weight loss: 84.7 lbs Weight loss goal: 170-180 lbs  TANITA  BODY COMP RESULTS  05/21/15 07/03/15 08/15/15 10/03/15 01/02/16   BMI (kg/m^2) 38.6 36.9 34.1 32.2 29.9   Fat Mass (lbs) 138.5 127.0 116 104 93.8   Fat Free Mass (lbs) 115.5 116.0 108.5 108 103   Total Body Water (lbs) 84.5 85.0 79.5 79 72.8
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
## 2016-01-09 ENCOUNTER — Encounter: Payer: Self-pay | Admitting: Internal Medicine

## 2016-01-21 DIAGNOSIS — H2511 Age-related nuclear cataract, right eye: Secondary | ICD-10-CM | POA: Diagnosis not present

## 2016-01-21 DIAGNOSIS — H25811 Combined forms of age-related cataract, right eye: Secondary | ICD-10-CM | POA: Diagnosis not present

## 2016-01-22 DIAGNOSIS — H2512 Age-related nuclear cataract, left eye: Secondary | ICD-10-CM | POA: Diagnosis not present

## 2016-01-22 DIAGNOSIS — H25042 Posterior subcapsular polar age-related cataract, left eye: Secondary | ICD-10-CM | POA: Diagnosis not present

## 2016-01-22 DIAGNOSIS — H25012 Cortical age-related cataract, left eye: Secondary | ICD-10-CM | POA: Diagnosis not present

## 2016-02-04 DIAGNOSIS — H2512 Age-related nuclear cataract, left eye: Secondary | ICD-10-CM | POA: Diagnosis not present

## 2016-02-11 DIAGNOSIS — R7301 Impaired fasting glucose: Secondary | ICD-10-CM | POA: Diagnosis not present

## 2016-02-11 DIAGNOSIS — I10 Essential (primary) hypertension: Secondary | ICD-10-CM | POA: Diagnosis not present

## 2016-02-11 DIAGNOSIS — E039 Hypothyroidism, unspecified: Secondary | ICD-10-CM | POA: Diagnosis not present

## 2016-02-11 DIAGNOSIS — E782 Mixed hyperlipidemia: Secondary | ICD-10-CM | POA: Diagnosis not present

## 2016-02-11 DIAGNOSIS — K219 Gastro-esophageal reflux disease without esophagitis: Secondary | ICD-10-CM | POA: Diagnosis not present

## 2016-02-14 DIAGNOSIS — E782 Mixed hyperlipidemia: Secondary | ICD-10-CM | POA: Diagnosis not present

## 2016-02-14 DIAGNOSIS — E6609 Other obesity due to excess calories: Secondary | ICD-10-CM | POA: Diagnosis not present

## 2016-02-14 DIAGNOSIS — I10 Essential (primary) hypertension: Secondary | ICD-10-CM | POA: Diagnosis not present

## 2016-03-03 DIAGNOSIS — L237 Allergic contact dermatitis due to plants, except food: Secondary | ICD-10-CM | POA: Diagnosis not present

## 2016-03-17 DIAGNOSIS — R945 Abnormal results of liver function studies: Secondary | ICD-10-CM | POA: Diagnosis not present

## 2016-03-17 DIAGNOSIS — E6609 Other obesity due to excess calories: Secondary | ICD-10-CM | POA: Diagnosis not present

## 2016-03-17 DIAGNOSIS — I10 Essential (primary) hypertension: Secondary | ICD-10-CM | POA: Diagnosis not present

## 2016-03-17 DIAGNOSIS — E039 Hypothyroidism, unspecified: Secondary | ICD-10-CM | POA: Diagnosis not present

## 2016-03-17 DIAGNOSIS — E782 Mixed hyperlipidemia: Secondary | ICD-10-CM | POA: Diagnosis not present

## 2016-03-17 DIAGNOSIS — R7301 Impaired fasting glucose: Secondary | ICD-10-CM | POA: Diagnosis not present

## 2016-03-17 DIAGNOSIS — R32 Unspecified urinary incontinence: Secondary | ICD-10-CM | POA: Diagnosis not present

## 2016-03-27 DIAGNOSIS — Z9884 Bariatric surgery status: Secondary | ICD-10-CM | POA: Diagnosis not present

## 2016-04-08 ENCOUNTER — Encounter (HOSPITAL_COMMUNITY): Payer: Self-pay | Admitting: Emergency Medicine

## 2016-04-08 ENCOUNTER — Emergency Department (HOSPITAL_COMMUNITY): Payer: Medicare Other

## 2016-04-08 ENCOUNTER — Emergency Department (HOSPITAL_COMMUNITY)
Admission: EM | Admit: 2016-04-08 | Discharge: 2016-04-08 | Disposition: A | Discharge status: Home / Self Care | Payer: Medicare Other | Attending: Emergency Medicine | Admitting: Emergency Medicine

## 2016-04-08 DIAGNOSIS — E039 Hypothyroidism, unspecified: Secondary | ICD-10-CM | POA: Diagnosis not present

## 2016-04-08 DIAGNOSIS — S52572A Other intraarticular fracture of lower end of left radius, initial encounter for closed fracture: Secondary | ICD-10-CM | POA: Insufficient documentation

## 2016-04-08 DIAGNOSIS — S62115A Nondisplaced fracture of triquetrum [cuneiform] bone, left wrist, initial encounter for closed fracture: Secondary | ICD-10-CM | POA: Insufficient documentation

## 2016-04-08 DIAGNOSIS — Z79899 Other long term (current) drug therapy: Secondary | ICD-10-CM | POA: Insufficient documentation

## 2016-04-08 DIAGNOSIS — W19XXXA Unspecified fall, initial encounter: Secondary | ICD-10-CM

## 2016-04-08 DIAGNOSIS — S52502A Unspecified fracture of the lower end of left radius, initial encounter for closed fracture: Secondary | ICD-10-CM | POA: Diagnosis not present

## 2016-04-08 DIAGNOSIS — Y999 Unspecified external cause status: Secondary | ICD-10-CM | POA: Insufficient documentation

## 2016-04-08 DIAGNOSIS — Y9389 Activity, other specified: Secondary | ICD-10-CM | POA: Insufficient documentation

## 2016-04-08 DIAGNOSIS — S6992XA Unspecified injury of left wrist, hand and finger(s), initial encounter: Secondary | ICD-10-CM | POA: Diagnosis present

## 2016-04-08 DIAGNOSIS — Y92096 Garden or yard of other non-institutional residence as the place of occurrence of the external cause: Secondary | ICD-10-CM | POA: Insufficient documentation

## 2016-04-08 DIAGNOSIS — S5292XA Unspecified fracture of left forearm, initial encounter for closed fracture: Secondary | ICD-10-CM

## 2016-04-08 DIAGNOSIS — W010XXA Fall on same level from slipping, tripping and stumbling without subsequent striking against object, initial encounter: Secondary | ICD-10-CM | POA: Insufficient documentation

## 2016-04-08 DIAGNOSIS — I10 Essential (primary) hypertension: Secondary | ICD-10-CM | POA: Insufficient documentation

## 2016-04-08 MED ORDER — OXYCODONE-ACETAMINOPHEN 5-325 MG PO TABS: 1.0000 | ORAL_TABLET | ORAL | 0 refills | Status: DC | PRN

## 2016-04-08 NOTE — ED Provider Notes (Signed)
[place] DEPT Provider Note   CSN: [id number] Arrival date & time: 04/08/16  1936  By signing my name below, I, Gwenlyn Fudge, attest that this documentation has been prepared under the direction and in the presence of Nat Christen, MD. Electronically Signed: Gwenlyn Fudge, ED Scribe. 04/08/16. 9:22 PM.   History   Chief Complaint Chief Complaint  Patient presents with  . Fall   The history is provided by the patient and a friend (pt's neighbor). No language interpreter was used.    HPI Comments: [name] is a 75 y.o. female with PMHx of GERD, HTN, and HLD who presents to the Emergency Department complaining of constant left wrist pain s/p fall earlier today. Pt was laying mulch in her yard when she tripped over a bag and fell on her cement driveway. When she fell, she turned and landed on her left elbow and wrist. Pain is exacerbated with movement of the wrist. She reports losing approximately 100 lbs from a gastric bypass performed in 06/2015. Pt currentyly sees Dr. Ninfa Linden from [facility].   Past Medical History:  Diagnosis Date  . Anxiety   . Arthritis   . Complication of anesthesia   . Family history of adverse reaction to anesthesia    "sister has hard time waking up"  . GERD (gastroesophageal reflux disease)   . HTN (hypertension)   . Hyperlipidemia   . Hypothyroidism   . Personal history of colonic polyps   . PONV (postoperative nausea and vomiting)   . Rheumatic fever 1958   hospitalized for three months  . Urinary leakage     Patient Active Problem List   Diagnosis Date Noted  . Morbid obesity ([place]) 06/19/2015  . GERD (gastroesophageal reflux disease) 01/20/2011  . Colon polyp 01/20/2011  . Hemorrhoids 01/20/2011    Past Surgical History:  Procedure Laterality Date  . APPENDECTOMY     incidental at time of C-section  . BACK SURGERY  1986  . CESAREAN SECTION  1979  . CHOLECYSTECTOMY  1982  . COLONOSCOPY  2004   Dr. Imogene Burn  polyps, left sided diverticula. Path unavailable.   . COLONOSCOPY  02/12/2011   Procedure: COLONOSCOPY;  Surgeon: Daneil Dolin, MD;  [facility];  Service: Endoscopy;  Laterality: N/A;  . ESOPHAGOGASTRODUODENOSCOPY  02/12/2011   Procedure: ESOPHAGOGASTRODUODENOSCOPY (EGD);  Surgeon: Daneil Dolin, MD;  Location: [facility];  Service: Endoscopy;  Laterality: N/A;  . GASTRIC ROUX-EN-Y N/A 06/19/2015   Procedure: LAPAROSCOPIC ROUX-EN-Y GASTRIC BYPASS WITH UPPER ENDOSCOPY;  Surgeon: Excell Seltzer, MD;  Location: [facility];  Service: General;  Laterality: N/A;  . [place]   left  . Roster Shots  2007   ?  Marland Kitchen TOTAL KNEE ARTHROPLASTY  10/2009   left  . UPPER GI ENDOSCOPY  06/19/2015   Procedure: UPPER GI ENDOSCOPY;  Surgeon: Excell Seltzer, MD;  Location: [facility];  Service: General;;    OB History    No data available       Home Medications    Prior to Admission medications   Medication Sig Start Date End Date Taking? Authorizing Provider  acetaminophen (TYLENOL) 325 MG tablet Take 325 mg by mouth every 6 (six) hours as needed for mild pain or headache.   Yes Historical Provider, MD  Calcium Carbonate-Vitamin D (CALCIUM-D) 600-400 MG-UNIT TABS Take 1 tablet by mouth 3 (three) times daily.   Yes Historical Provider, MD  cholecalciferol (VITAMIN D) 1000 units tablet Take 1,000 Units  by mouth daily.   Yes Historical Provider, MD  Cyanocobalamin (VITAMIN B-12) 2500 MCG SUBL Place 2,500 mcg under the tongue daily.    Yes Historical Provider, MD  escitalopram (LEXAPRO) 20 MG tablet Take 20 mg by mouth daily.     Yes Historical Provider, MD  levothyroxine (SYNTHROID, LEVOTHROID) 100 MCG tablet Take 100 mcg by mouth daily before breakfast.   Yes Historical Provider, MD  loratadine (CLARITIN) 10 MG tablet Take 10 mg by mouth daily.   Yes Historical Provider, MD  Multiple Vitamin (MULTIVITAMIN WITH MINERALS) TABS tablet Take 1 tablet by mouth daily.   Yes Historical  Provider, MD  oxybutynin (DITROPAN) 5 MG tablet Take 5 mg by mouth 2 (two) times daily.  05/09/13  Yes Historical Provider, MD  sennosides-docusate sodium (SENOKOT-S) 8.6-50 MG tablet Take 1 tablet by mouth daily.   Yes Historical Provider, MD  vitamin C (ASCORBIC ACID) 500 MG tablet Take 500 mg by mouth 2 (two) times daily.    Yes Historical Provider, MD  oxyCODONE-acetaminophen (PERCOCET) 5-325 MG tablet Take 1 tablet by mouth every 4 (four) hours as needed. 04/08/16   Nat Christen, MD    Family History Family History  Problem Relation Age of Onset  . Diabetes      siblings, mother  . Lung disease Father     black lung, died young  . Heart disease Brother   . Colon cancer Neg Hx     Social History Social History  Substance Use Topics  . Smoking status: Never Smoker  . Smokeless tobacco: Never Used  . Alcohol use No     Allergies   Review of patient's allergies indicates no known allergies.   Review of Systems Review of Systems A complete 10 system review of systems was obtained and all systems are negative except as noted in the HPI and PMH.    Physical Exam Updated Vital Signs BP 127/60 (BP Location: Left Arm)   Pulse (!) 50   Temp 98.1 F (36.7 C) (Oral)   Resp 16   Ht 5\' 7"  (1.702 m)   Wt 189 lb (85.7 kg)   SpO2 93%   BMI 29.60 kg/m   Physical Exam  Constitutional: She is oriented to person, place, and time. She appears well-developed and well-nourished.  HENT:  Head: Normocephalic and atraumatic.  Eyes: Conjunctivae are normal.  Neck: Neck supple.  Cardiovascular: Normal rate and regular rhythm.   Pulmonary/Chest: Effort normal and breath sounds normal.  Abdominal: Soft. Bowel sounds are normal.  Musculoskeletal: She exhibits tenderness.  Most tender to the distal radius and distal ulna with any ROM of left wrist  Neurological: She is alert and oriented to person, place, and time.  Skin: Skin is warm and dry.  Psychiatric: She has a normal mood and  affect. Her behavior is normal.  Nursing note and vitals reviewed.  ED Treatments / Results  DIAGNOSTIC STUDIES: Oxygen Saturation is 96% on RA, adequate by my interpretation.    COORDINATION OF CARE: 9:13 PM Discussed treatment plan with pt at bedside which includes immobilization of wrist with a cast and prescription for pain management and pt agreed to plan. Pt referred to her Orthopedist, Dr. Ninfa Linden.  Labs (all labs ordered are listed, but only abnormal results are displayed) Labs Reviewed - No data to display  EKG  EKG Interpretation None       Radiology Dg Wrist Complete Left  Result Date: 04/08/2016 CLINICAL DATA:  Left wrist pain after fall today.  EXAM: LEFT WRIST - COMPLETE 3+ VIEW COMPARISON:  None. FINDINGS: Moderately displaced fracture is seen involving the distal left radius with intra-articular extension. This appears to be closed and posttraumatic. Probable mildly displaced triquetrum fracture is noted as well. Severe degenerative changes seen involving the first carpometacarpal joint. IMPRESSION: Moderately displaced distal left radial fracture with intra-articular extension. Probable mildly displaced triquetrum fracture. Electronically Signed   By: Marijo Conception, M.D.   On: 04/08/2016 20:48    Procedures Procedures (including critical care time)  Medications Ordered in ED Medications - No data to display   Initial Impression / Assessment and Plan / ED Course  I have reviewed the triage vital signs and the nursing notes.  Pertinent labs & imaging results that were available during my care of the patient were reviewed by me and considered in my medical decision making (see chart for details).  Clinical Course    No head or neck injury. Plain films of left wrist show a radial and triquetrum fracture.  Immobilizer, ice, elevate, Percocet, referral to orthopedics. Discussed with patient and her friend  Final Clinical Impressions(s) / ED Diagnoses   Final  diagnoses:  Fall, initial encounter  Left radial fracture, closed, initial encounter  Closed nondisplaced fracture of triquetrum of left wrist, initial encounter    New Prescriptions New Prescriptions   OXYCODONE-ACETAMINOPHEN (PERCOCET) 5-325 MG TABLET    Take 1 tablet by mouth every 4 (four) hours as needed.   I personally performed the services described in this documentation, which was scribed in my presence. The recorded information has been reviewed and is accurate.      Nat Christen, MD 04/08/16 2239

## 2016-04-08 NOTE — ED Triage Notes (Signed)
Pt tripped over a bag while putting mulch down. Pt c/o left wrist pain and abrasion to the left elbow.

## 2016-04-08 NOTE — Discharge Instructions (Signed)
You have fractured your left radius and triquetrum bone of the wrist. Call your orthopedic doctor tomorrow for follow-up. Splint, ice, elevate, pain medication

## 2016-04-10 DIAGNOSIS — S52515A Nondisplaced fracture of left radial styloid process, initial encounter for closed fracture: Secondary | ICD-10-CM | POA: Diagnosis not present

## 2016-04-10 DIAGNOSIS — R0782 Intercostal pain: Secondary | ICD-10-CM | POA: Diagnosis not present

## 2016-04-10 DIAGNOSIS — M545 Low back pain: Secondary | ICD-10-CM | POA: Diagnosis not present

## 2016-04-16 DIAGNOSIS — S52515D Nondisplaced fracture of left radial styloid process, subsequent encounter for closed fracture with routine healing: Secondary | ICD-10-CM | POA: Diagnosis not present

## 2016-05-07 ENCOUNTER — Ambulatory Visit (INDEPENDENT_AMBULATORY_CARE_PROVIDER_SITE_OTHER): Payer: Medicare Other | Admitting: Orthopaedic Surgery

## 2016-05-07 DIAGNOSIS — S52515D Nondisplaced fracture of left radial styloid process, subsequent encounter for closed fracture with routine healing: Secondary | ICD-10-CM | POA: Diagnosis not present

## 2016-06-09 DIAGNOSIS — Z23 Encounter for immunization: Secondary | ICD-10-CM | POA: Diagnosis not present

## 2016-06-10 ENCOUNTER — Ambulatory Visit (INDEPENDENT_AMBULATORY_CARE_PROVIDER_SITE_OTHER): Payer: Medicare Other | Admitting: Physician Assistant

## 2016-06-10 ENCOUNTER — Ambulatory Visit (INDEPENDENT_AMBULATORY_CARE_PROVIDER_SITE_OTHER): Payer: Medicare Other

## 2016-06-10 DIAGNOSIS — S52515D Nondisplaced fracture of left radial styloid process, subsequent encounter for closed fracture with routine healing: Secondary | ICD-10-CM | POA: Diagnosis not present

## 2016-06-10 NOTE — Progress Notes (Signed)
Office Visit Note   Patient: [name]           Date of Birth: 1940-11-21           MRN: [id number] Visit Date: 06/10/2016              Requested by: Celene Squibb, MD [address] [place], [place] [address] PCP: Wende Neighbors, MD   Assessment & Plan: Visit Diagnoses:  1. Closed nondisplaced fracture of styloid process of left radius with routine healing     Plan: Activities as tolerated with the left hand.  Follow-Up Instructions: Return if symptoms worsen or fail to improve.   Orders:  Orders Placed This Encounter  Procedures  . XR Wrist 2 Views Left   No orders of the defined types were placed in this encounter.     Procedures: No procedures performed   Clinical Data: No additional findings.   Subjective: Chief Complaint  Patient presents with  . Left Wrist - Follow-up    Doing better, wearing velcro wrist splint    Returns today 8 weeks status post left nondisplaced radial styloid fracture. Still  having some pain in the wrists but slowly improving. She has been coming out of the brace for range of motion.    Review of Systems   Objective: Vital Signs: There were no vitals taken for this visit.  Physical Exam  Ortho Exam  Left wrist good dorsiflexion slightly limited volar flexion. Radial and ulnar deviation are full. Slight tenderness over the left distal radius. Left hand neurovascularly intact.  Specialty Comments:  No specialty comments available.  Imaging: No results found.   PMFS History: Patient Active Problem List   Diagnosis Date Noted  . Morbid obesity ([place]) 06/19/2015  . GERD (gastroesophageal reflux disease) 01/20/2011  . Colon polyp 01/20/2011  . Hemorrhoids 01/20/2011   Past Medical History:  Diagnosis Date  . Anxiety   . Arthritis   . Complication of anesthesia   . Family history of adverse reaction to anesthesia    "sister has hard time waking up"  . GERD (gastroesophageal reflux disease)   . HTN  (hypertension)   . Hyperlipidemia   . Hypothyroidism   . Personal history of colonic polyps   . PONV (postoperative nausea and vomiting)   . Rheumatic fever 1958   hospitalized for three months  . Urinary leakage     Family History  Problem Relation Age of Onset  . Diabetes      siblings, mother  . Lung disease Father     black lung, died young  . Heart disease Brother   . Colon cancer Neg Hx     Past Surgical History:  Procedure Laterality Date  . APPENDECTOMY     incidental at time of C-section  . BACK SURGERY  1986  . CESAREAN SECTION  1979  . CHOLECYSTECTOMY  1982  . COLONOSCOPY  2004   Dr. Imogene Burn polyps, left sided diverticula. Path unavailable.   . COLONOSCOPY  02/12/2011   Procedure: COLONOSCOPY;  Surgeon: Daneil Dolin, MD;  [facility];  Service: Endoscopy;  Laterality: N/A;  . ESOPHAGOGASTRODUODENOSCOPY  02/12/2011   Procedure: ESOPHAGOGASTRODUODENOSCOPY (EGD);  Surgeon: Daneil Dolin, MD;  [facility];  Service: Endoscopy;  Laterality: N/A;  . GASTRIC ROUX-EN-Y N/A 06/19/2015   Procedure: LAPAROSCOPIC ROUX-EN-Y GASTRIC BYPASS WITH UPPER ENDOSCOPY;  Surgeon: Excell Seltzer, MD;  Location: [facility];  Service: General;  Laterality: N/A;  . KNEE  SURGERY  1999   left  . Roster Shots  2007   ?  Marland Kitchen TOTAL KNEE ARTHROPLASTY  10/2009   left  . UPPER GI ENDOSCOPY  06/19/2015   Procedure: UPPER GI ENDOSCOPY;  Surgeon: Excell Seltzer, MD;  Location: [facility];  Service: General;;   Social History   Occupational History  .  Retired   Social History Main Topics  . Smoking status: Never Smoker  . Smokeless tobacco: Never Used  . Alcohol use No  . Drug use: No  . Sexual activity: Not on file

## 2016-06-25 ENCOUNTER — Ambulatory Visit (INDEPENDENT_AMBULATORY_CARE_PROVIDER_SITE_OTHER): Payer: Medicare Other | Admitting: Nurse Practitioner

## 2016-06-25 ENCOUNTER — Encounter: Payer: Self-pay | Admitting: Nurse Practitioner

## 2016-06-25 VITALS — BP 110/80 | HR 61 | Ht 68.0 in | Wt 178.0 lb

## 2016-06-25 DIAGNOSIS — I10 Essential (primary) hypertension: Secondary | ICD-10-CM

## 2016-06-25 DIAGNOSIS — I359 Nonrheumatic aortic valve disorder, unspecified: Secondary | ICD-10-CM

## 2016-06-25 NOTE — Progress Notes (Signed)
CARDIOLOGY OFFICE NOTE  Date:  06/25/2016    [name] Date of Birth: 07/12/1941 Medical Record [id number]  PCP:  Wende Neighbors, MD  Cardiologist:  Johnsie Cancel  Chief Complaint  Patient presents with  . Hypertension    1 year check - seen for Dr. Johnsie Cancel    History of Present Illness: [name] is a 75 y.o. female who presents today for a one year check. Seen for Dr. Johnsie Cancel.   She has a history of HLD, GERD, HTN, ?rheumatic fever as a child, chronic exertional dyspnea and obesity.   Seen a year ago - needed clearance for bariatric surgery. Looks like some concern with her preop CXR - Dr. Johnsie Cancel felt this was due to her size according to the patient. Was doing well clinically.   Comes in today. Here alone. Doing great. She has lost over 100 pounds. Feels great. No chest pain. Not short of breath. No swelling. Not dizzy or lightheaded. BP good. Not on any cardiac medicines. She is very pleased with how she is doing.    Past Medical History:  Diagnosis Date  . Anxiety   . Arthritis   . Complication of anesthesia   . Family history of adverse reaction to anesthesia    "sister has hard time waking up"  . GERD (gastroesophageal reflux disease)   . HTN (hypertension)   . Hyperlipidemia   . Hypothyroidism   . Personal history of colonic polyps   . PONV (postoperative nausea and vomiting)   . Rheumatic fever 1958   hospitalized for three months  . Urinary leakage     Past Surgical History:  Procedure Laterality Date  . APPENDECTOMY     incidental at time of C-section  . BACK SURGERY  1986  . CESAREAN SECTION  1979  . CHOLECYSTECTOMY  1982  . COLONOSCOPY  2004   Dr. Imogene Burn polyps, left sided diverticula. Path unavailable.   . COLONOSCOPY  02/12/2011   Procedure: COLONOSCOPY;  Surgeon: Daneil Dolin, MD;  [facility];  Service: Endoscopy;  Laterality: N/A;  . ESOPHAGOGASTRODUODENOSCOPY  02/12/2011   Procedure: ESOPHAGOGASTRODUODENOSCOPY  (EGD);  Surgeon: Daneil Dolin, MD;  [facility];  Service: Endoscopy;  Laterality: N/A;  . GASTRIC ROUX-EN-Y N/A 06/19/2015   Procedure: LAPAROSCOPIC ROUX-EN-Y GASTRIC BYPASS WITH UPPER ENDOSCOPY;  Surgeon: Excell Seltzer, MD;  Location: [facility];  Service: General;  Laterality: N/A;  . [place]   left  . Roster Shots  2007   ?  Marland Kitchen TOTAL KNEE ARTHROPLASTY  10/2009   left  . UPPER GI ENDOSCOPY  06/19/2015   Procedure: UPPER GI ENDOSCOPY;  Surgeon: Excell Seltzer, MD;  Location: [facility];  Service: General;;     Medications: Current Outpatient Prescriptions  Medication Sig Dispense Refill  . acetaminophen (TYLENOL) 325 MG tablet Take 325 mg by mouth every 6 (six) hours as needed for mild pain or headache.    . Calcium Carbonate-Vitamin D (CALCIUM-D) 600-400 MG-UNIT TABS Take 1 tablet by mouth 3 (three) times daily.    . cholecalciferol (VITAMIN D) 1000 units tablet Take 1,000 Units by mouth daily.    . Cyanocobalamin (VITAMIN B-12) 2500 MCG SUBL Place 2,500 mcg under the tongue daily.     Marland Kitchen escitalopram (LEXAPRO) 20 MG tablet Take 20 mg by mouth daily.      Marland Kitchen levothyroxine (SYNTHROID, LEVOTHROID) 100 MCG tablet Take 100 mcg by mouth daily before breakfast.    .  loratadine (CLARITIN) 10 MG tablet Take 10 mg by mouth daily.    . Multiple Vitamin (MULTIVITAMIN WITH MINERALS) TABS tablet Take 1 tablet by mouth daily.    Marland Kitchen oxybutynin (DITROPAN) 5 MG tablet Take 5 mg by mouth 2 (two) times daily.     . sennosides-docusate sodium (SENOKOT-S) 8.6-50 MG tablet Take 1 tablet by mouth daily.    . vitamin C (ASCORBIC ACID) 500 MG tablet Take 500 mg by mouth 2 (two) times daily.      No current facility-administered medications for this visit.     Allergies: No Known Allergies  Social History: The patient  reports that she has never smoked. She has never used smokeless tobacco. She reports that she does not drink alcohol or use drugs.   Family History: The patient's  family history includes Heart disease in her brother; Lung disease in her father.   Review of Systems: Please see the history of present illness.   Otherwise, the review of systems is positive for none.   All other systems are reviewed and negative.   Physical Exam: VS:  BP 110/80   Pulse 61   Ht 5\' 8"  (1.727 m)   Wt 178 lb (80.7 kg)   BMI 27.06 kg/m  .  BMI Body mass index is 27.06 kg/m.  Wt Readings from Last 3 Encounters:  06/25/16 178 lb (80.7 kg)  04/08/16 189 lb (85.7 kg)  01/02/16 196 lb 12.8 oz (89.3 kg)    General: Pleasant. Well developed, well nourished and in no acute distress.   HEENT: Normal.  Neck: Supple, no JVD, carotid bruits, or masses noted.  Cardiac: Regular rate and rhythm. No murmurs, rubs, or gallops. No edema.  Respiratory:  Lungs are clear to auscultation bilaterally with normal work of breathing.  GI: Soft and nontender.  MS: No deformity or atrophy. Gait and ROM intact.  Skin: Warm and dry. Color is normal.  Neuro:  Strength and sensation are intact and no gross focal deficits noted.  Psych: Alert, appropriate and with normal affect.   LABORATORY DATA:  EKG:  EKG is ordered today. This demonstrates NSR with poor R wave progression - unchanged.  Lab Results  Component Value Date   WBC 6.7 06/21/2015   HGB 13.2 06/21/2015   HCT 41.0 06/21/2015   PLT 147 (L) 06/21/2015   GLUCOSE 106 (H) 06/14/2015   ALT 20 06/14/2015   AST 26 06/14/2015   NA 137 06/14/2015   K 4.2 06/14/2015   CL 102 06/14/2015   CREATININE 0.98 06/14/2015   BUN 31 (H) 06/14/2015   CO2 28 06/14/2015   INR 1.46 10/30/2009    BNP (last 3 results) No results for input(s): BNP in the last 8760 hours.  ProBNP (last 3 results) No results for input(s): PROBNP in the last 8760 hours.   Other Studies Reviewed Today:  Echo Study Conclusions from 06/2015  - Left ventricle: Abnormal septal motion The cavity size was   normal. Systolic function was normal. The estimated  ejection   fraction was 55%. Wall motion was normal; there were no regional   wall motion abnormalities. Left ventricular diastolic function   parameters were normal. - Aortic valve: There was mild regurgitation. - Left atrium: The atrium was mildly dilated. - Atrial septum: No defect or patent foramen ovale was identified.  Assessment/Plan: 1.  S/p gastric bypass - she has been very successful with weight loss.   2. HTN - not on any antihypertensives. BP is great  here today.   3. Dyspnea - resolved.   4. AR: mild with no rheumatic disease on echo - Dr. Johnsie Cancel has suggested f/u in 2 years  Current medicines are reviewed with the patient today.  The patient does not have concerns regarding medicines other than what has been noted above.  The following changes have been made:  See above.  Labs/ tests ordered today include:    Orders Placed This Encounter  Procedures  . EKG 12-Lead     Disposition:   FU here prn. Can arrange for echocardiogram in November of 2018 and see back as needed.   Patient is agreeable to this plan and will call if any problems develop in the interim.   Signed: Burtis Junes, RN, ANP-C 06/25/2016 10:49 AM  [facility] [address] [place], [place]  [address] Phone: [phone] Fax: [phone]

## 2016-06-25 NOTE — Patient Instructions (Addendum)
We will be checking the following labs today - NONE   Medication Instructions:    Continue with your current medicines.     Testing/Procedures To Be Arranged:  Will arrange for echocardiogram in one year.   Follow-Up:   See [place] back prn.     Other Special Instructions:   Keep up the good work!    If you need a refill on your cardiac medications before your next appointment, please call your pharmacy.   Call the [place] office at [phone] if you have any questions, problems or concerns.

## 2016-06-30 ENCOUNTER — Encounter: Payer: Medicare Other | Attending: General Surgery | Admitting: Dietician

## 2016-06-30 ENCOUNTER — Encounter: Payer: Self-pay | Admitting: Dietician

## 2016-06-30 DIAGNOSIS — Z9884 Bariatric surgery status: Secondary | ICD-10-CM | POA: Insufficient documentation

## 2016-06-30 DIAGNOSIS — E669 Obesity, unspecified: Secondary | ICD-10-CM | POA: Insufficient documentation

## 2016-06-30 DIAGNOSIS — Z713 Dietary counseling and surveillance: Secondary | ICD-10-CM | POA: Insufficient documentation
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
## 2016-06-30 NOTE — Patient Instructions (Addendum)
Goals:  Follow Phase 3B: High Protein + Non-Starchy Vegetables  Eat 3-6 small meals/snacks, every 3-5 hrs  Increase lean protein foods to meet 60g goal  Increase fluid intake to 64oz +  Avoid drinking 15 minutes before, during and 30 minutes after eating  Aim for >30 min of physical activity daily  Avoid weighing every day (aim for every other day ONLY in the morning)  Keep focusing on non scale victories  *Consider reaching out to [place]: [address],  [place], [place]     Surgery date: 06/19/15 Surgery type: RYGB Start weight at [facility]: 281.5 lbs on 08/28/14 Weight today: 179.4 lbs Weight change: 12 lbs Total weight loss: 102 lbs Weight loss goal: 170-180 lbs  TANITA  BODY COMP RESULTS  05/21/15 07/03/15 08/15/15 10/03/15 01/02/16 06/30/16   BMI (kg/m^2) 38.6 36.9 34.1 32.2 29.9 27.3   Fat Mass (lbs) 138.5 127.0 116 104 93.8 77   Fat Free Mass (lbs) 115.5 116.0 108.5 108 103 102.4   Total Body Water (lbs) 84.5 85.0 79.5 79 72.8 71.8

## 2016-07-10 DIAGNOSIS — Z9884 Bariatric surgery status: Secondary | ICD-10-CM | POA: Diagnosis not present

## 2016-07-21 ENCOUNTER — Telehealth: Payer: Self-pay | Admitting: *Deleted

## 2016-07-21 NOTE — Telephone Encounter (Signed)
-----   Message from Burtis Junes, NP sent at 07/21/2016 11:08 AM EST -----   ----- Message ----- From: Burtis Junes, NP Sent: 06/25/2016  10:56 AM To: Burtis Junes, NP

## 2016-07-21 NOTE — Progress Notes (Signed)
[name],  Will you call Ms. Mare Ferrari and let her know that Dr. Johnsie Cancel wanted her to have a follow up echo in November of 2018 to make sure no evidence of rheumatic heart disease.   I did place this as a future order.   Thanks  Hibba Schram

## 2016-07-21 NOTE — Telephone Encounter (Signed)
Pt is aware and recall placed in system.

## 2016-10-16 IMAGING — RF DG UGI W/ GASTROGRAFIN
9 series · 15 of 19 positions shown · IV contrast (omnipaque)
Comparison: 05/24/2015.

CLINICAL DATA: Initial encounter for status post gastric bypass.

EXAM:
WATER SOLUBLE UPPER GI SERIES
TECHNIQUE: Single-column upper GI series was performed using water soluble
contrast.
CONTRAST:  50mL OMNIPAQUE IOHEXOL 300 MG/ML  SOLN

[Series 1: t abdomen supine · 0.15mm/px · 1 of 1 slices shown (1 of 2)]
[im 1/1]
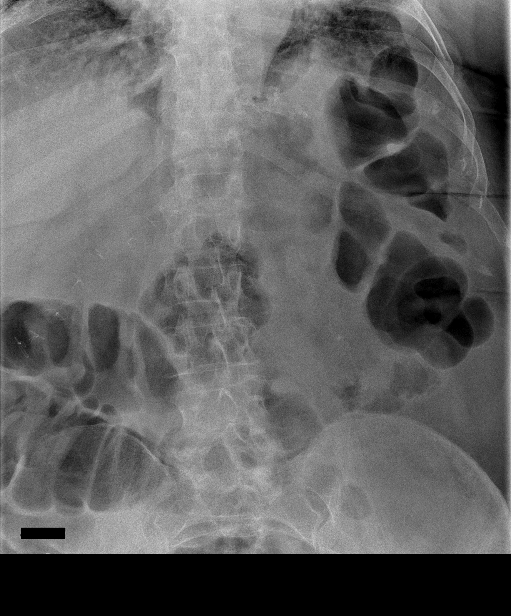

[Series 2: t abdomen supine · 0.15mm/px · 1 of 1 slices shown (2 of 2)]
[im 1/1]
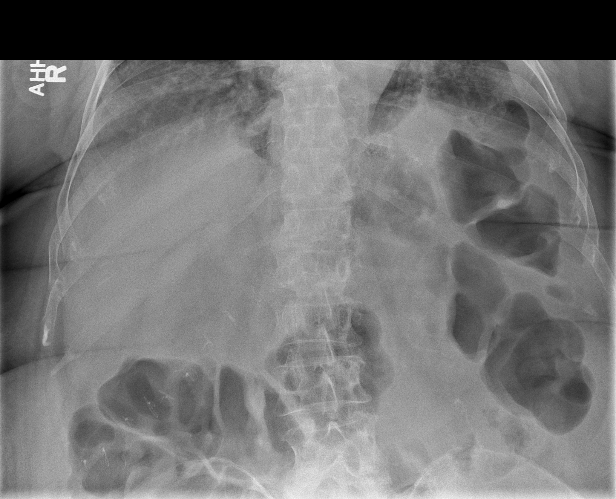

[Series 3: fluoro_barium 2fps_bw · 0.18mm/px · 3 of 9 frames shown (1 of 5)]
[frame 3/9]
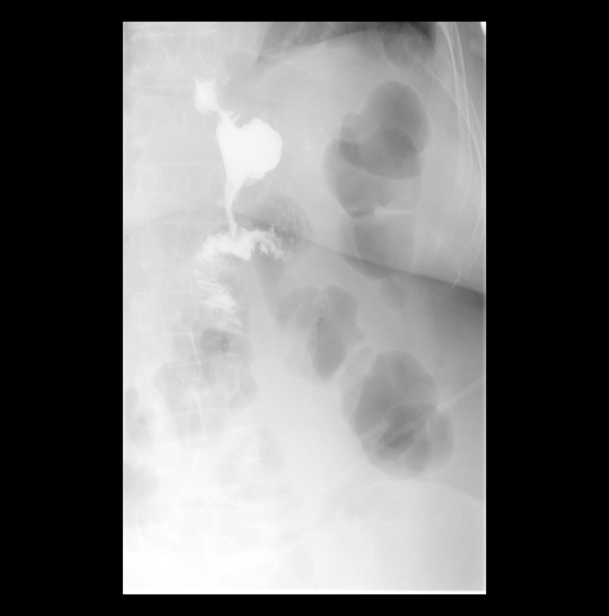
[frame 5/9]
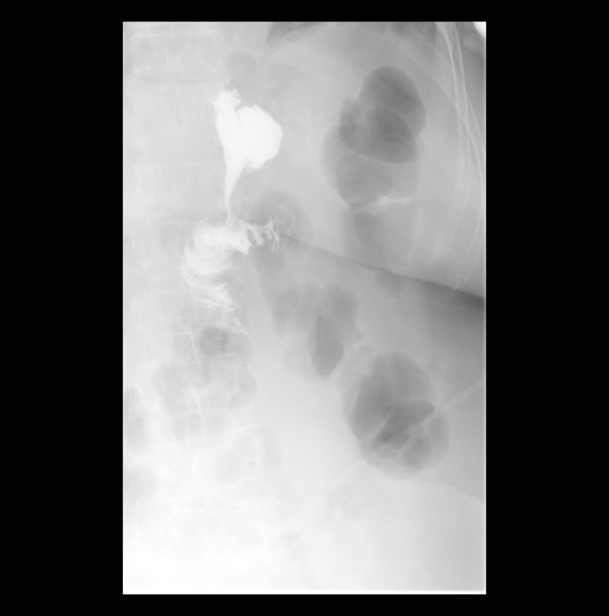
[frame 8/9]
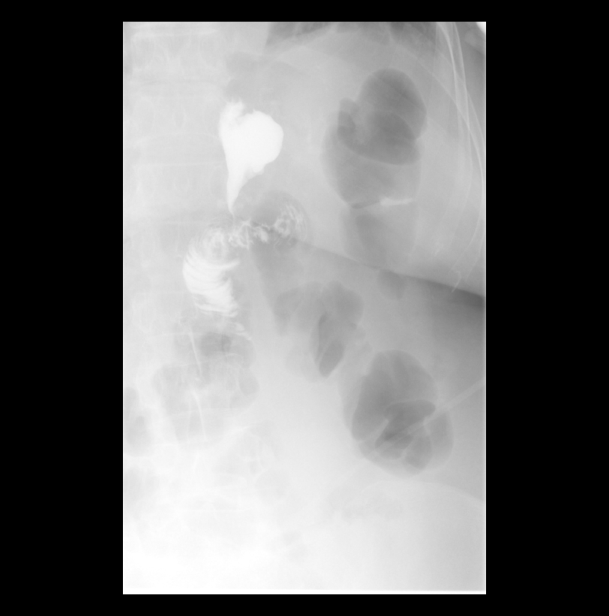

[Series 4: fluoro_barium 2fps_bw · 0.18mm/px · 2 of 9 frames shown (2 of 5)]
[frame 2/9]
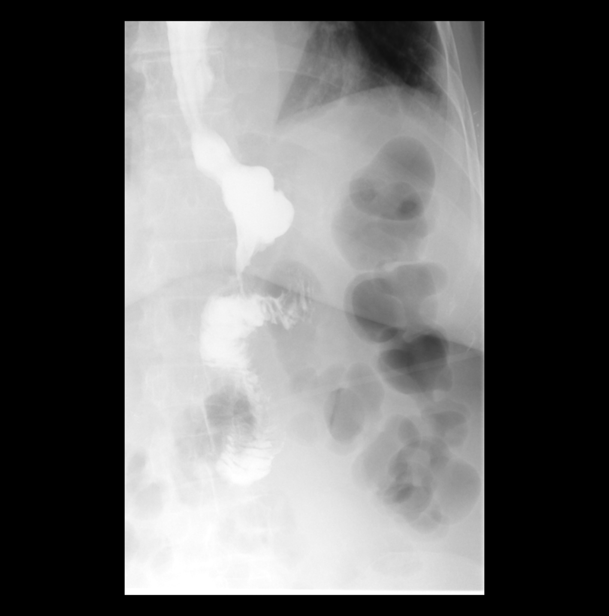
[frame 8/9]
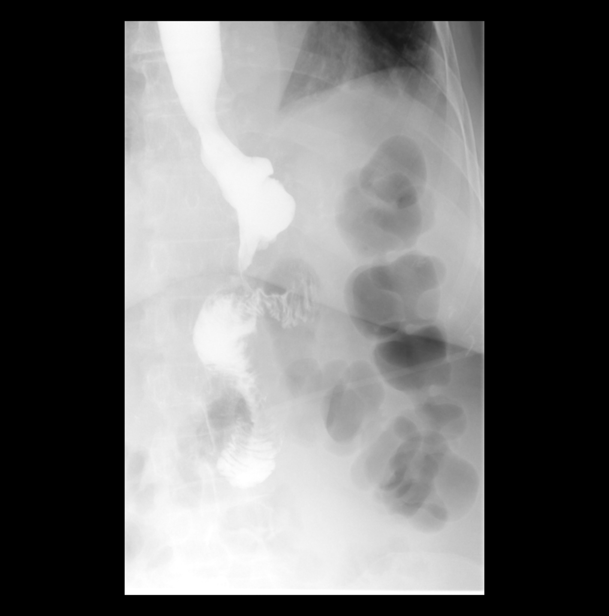

[Series 5: cp_standard · 0.27mm/px · 1 of 1 slices shown (1 of 2)]
[im 1/1]
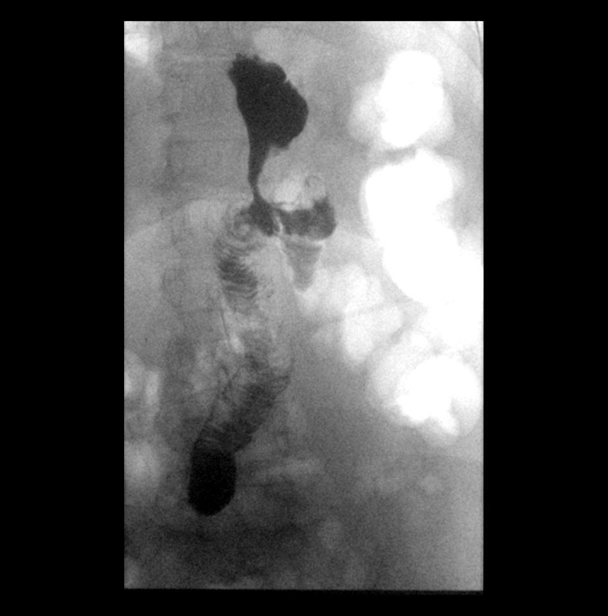

[Series 6: cp_standard · 0.27mm/px · 1 of 1 slices shown (2 of 2)]
[im 1/1]
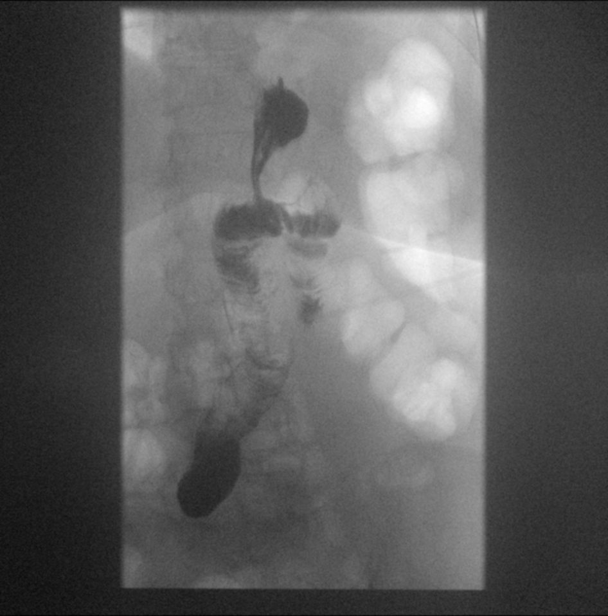

[Series 7: fluoro_barium 2fps_bw · 0.18mm/px · 2 of 4 frames shown (3 of 5)]
[frame 3/4]
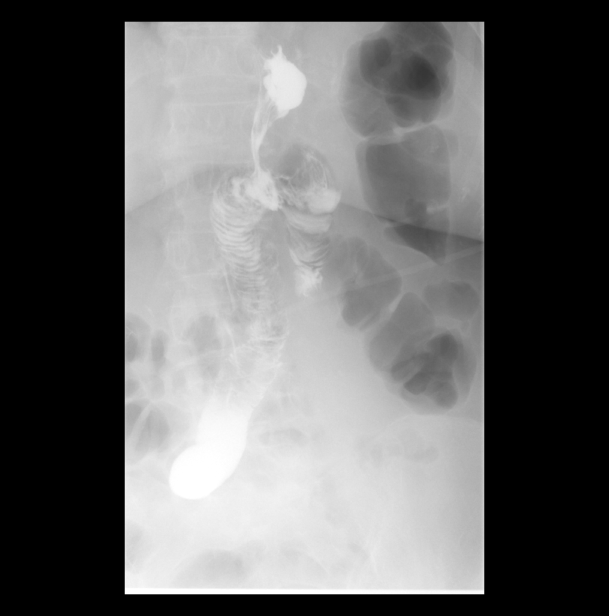
[frame 4/4]
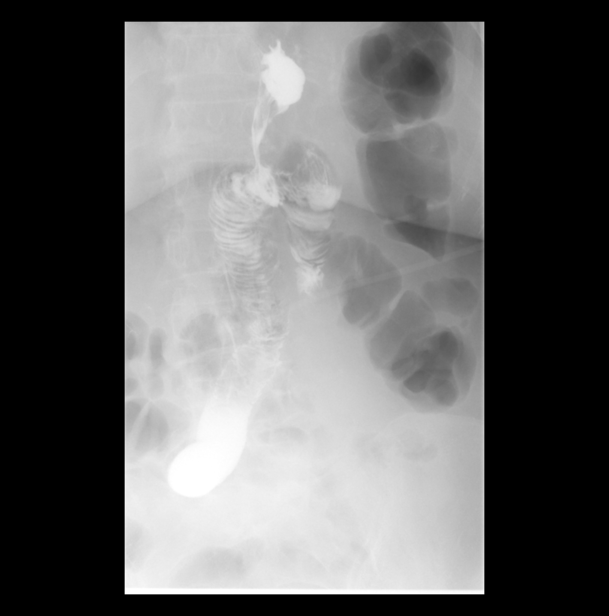

[Series 8: fluoro_barium 2fps_bw · 0.19mm/px · 3 of 7 frames shown (4 of 5)]
[frame 2/7]
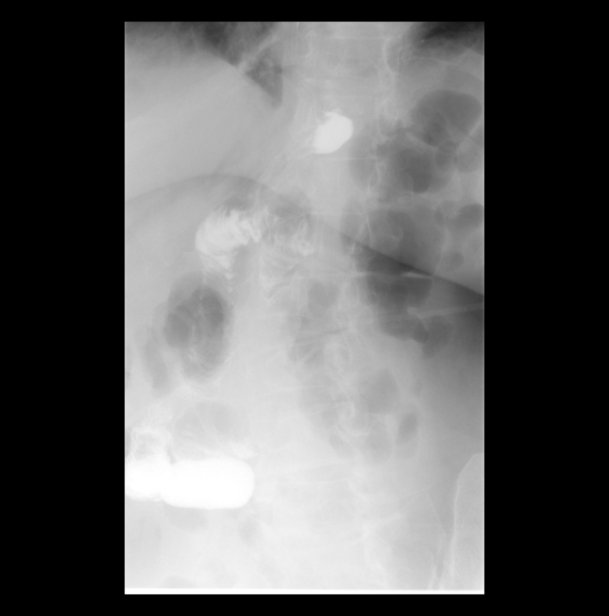
[frame 3/7]
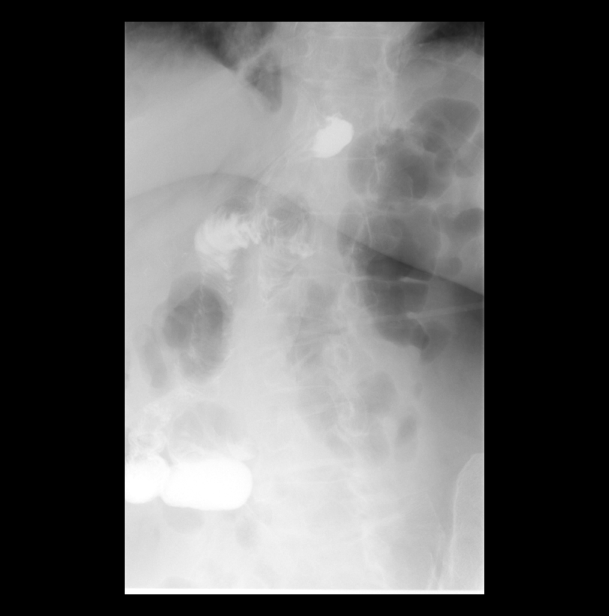
[frame 6/7]
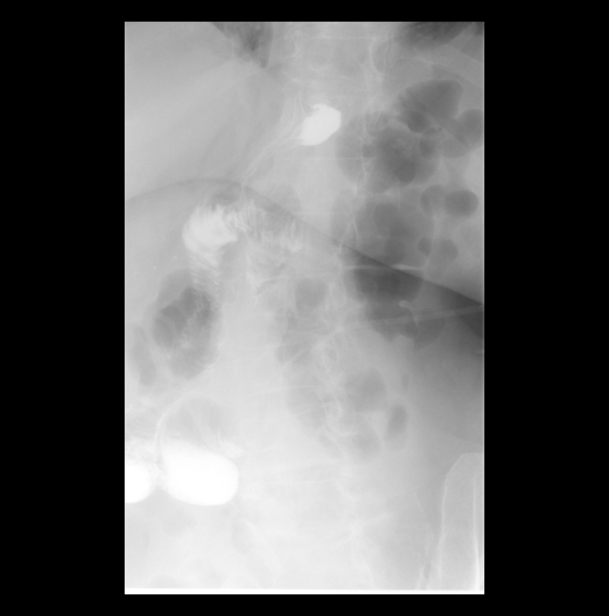

[Series 9: fluoro_barium 2fps_bw · 0.19mm/px · 1 of 1 slices shown (5 of 5)]
[im 1/1]
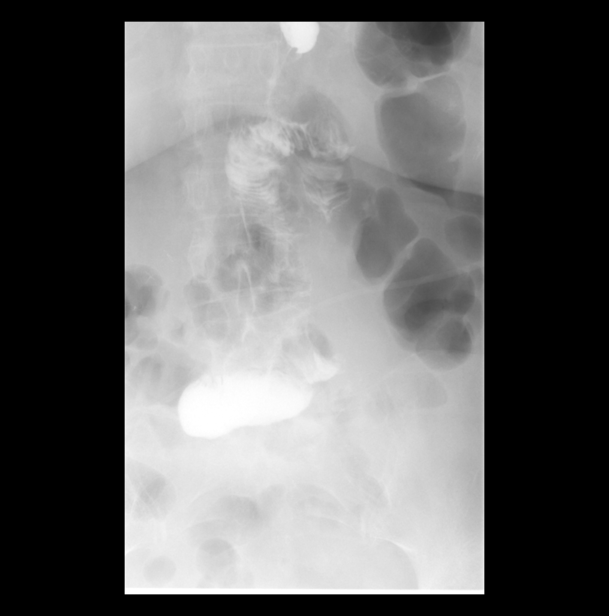

[15 of 19 positions shown; findings below may reference images not displayed]

FLUOROSCOPY TIME:  Radiation Exposure Index (as provided by the
fluoroscopic device):

If the device does not provide the exposure index:

Fluoroscopy Time (in minutes and seconds):  1 minutes and 0 seconds

Number of Acquired Images:
FINDINGS: Pre-procedure KUB shows slight gaseous distention of colon. No
evidence for gaseous small bowel dilatation.

Patient was given 50 cc water-soluble contrast by mouth. Contrast
material passes into the stomach and readily flows through the
gastroenteric anastomosis. No evidence for contrast extravasation to
suggest leak at the gastrojejunal anastomosis. Contrast flows
distally into jejunal loops which are nondilated. Only limited
peristalsis is evident in the distal small bowel anastomosis could
not be visualized.
IMPRESSION: Unobstructed flow of contrast material through the gastroenteric
anastomosis without evidence for contrast leak.

No evidence for dilatation of the efferent limb.

Limited peristalsis of small bowel loops.

## 2016-10-22 ENCOUNTER — Ambulatory Visit (INDEPENDENT_AMBULATORY_CARE_PROVIDER_SITE_OTHER): Payer: Medicare Other | Admitting: Orthopaedic Surgery

## 2016-10-22 ENCOUNTER — Ambulatory Visit (INDEPENDENT_AMBULATORY_CARE_PROVIDER_SITE_OTHER): Payer: Medicare Other

## 2016-10-22 DIAGNOSIS — M1711 Unilateral primary osteoarthritis, right knee: Secondary | ICD-10-CM | POA: Diagnosis not present

## 2016-10-22 DIAGNOSIS — G8929 Other chronic pain: Secondary | ICD-10-CM | POA: Diagnosis not present

## 2016-10-22 DIAGNOSIS — M65312 Trigger thumb, left thumb: Secondary | ICD-10-CM | POA: Diagnosis not present

## 2016-10-22 DIAGNOSIS — M25561 Pain in right knee: Secondary | ICD-10-CM

## 2016-10-22 MED ORDER — METHYLPREDNISOLONE ACETATE 40 MG/ML IJ SUSP
40.0000 mg | INTRAMUSCULAR | Status: AC | PRN
  Administered 2016-10-22: 40 mg

## 2016-10-22 NOTE — Progress Notes (Signed)
Office Visit Note   Patient: [name]           Date of Birth: [birth year]           MRN: [id number] Visit Date: 10/22/2016              Requested by: Celene Squibb, MD [address] [place], [place] [address] PCP: Wende Neighbors, MD   Assessment & Plan: Visit Diagnoses:  1. Chronic pain of right knee   2. Unilateral primary osteoarthritis, right knee   3. Trigger thumb, left thumb     Plan: She tolerated the injection over the A1 pulley of her left thumb well. As far as her right knee goes she is requesting and we are recommending a knee replacement. Thorough discussion was had with the risk and benefits of the surgery. She has tried and failed all forms conservative treatment. This is been going on for many years now. She has tried quad strengthening exercises, activity modification, walking with assistive device, anti-inflammatories, multiple steroid injections in the joint. We went over her knee model and explained in detail what knee replaced surgery involves and we went over x-rays as well. She would like to have this set up in the near future. We will see her back in 2 weeks postoperative but no x-rays of be needed.  Follow-Up Instructions: Return for 2 weeks post-op.   Orders:  Orders Placed This Encounter  Procedures  . Hand/Upper Extremity Injection/Arthrocentesis  . XR Knee 1-2 Views Right   No orders of the defined types were placed in this encounter.     Procedures: Hand/UE Inj Date/Time: 10/22/2016 1:10 PM Performed by: Mcarthur Rossetti Authorized by: Mcarthur Rossetti   Condition: trigger finger   Location:  Thumb Site:  L thumb A1 Medications:  40 mg methylPREDNISolone acetate 40 MG/ML     Clinical Data: No additional findings.   Subjective: No chief complaint on file. The patient's 76 year old female well-known to me. She has had a remote history of a left total knee replacement that we did years ago. Her right knee is bothersome.  We well-documented her knee over the years on the right knee in terms of her severe osteoarthritis and degenerative joint disease she said at this point she would like to proceed with a knee replacement. Her right knee pain hurts her daily. Her pain is 10 out of 10. It is detrimentally affected her activities daily living, her quality of life, and her mobility. Her knee locks and catches on her it's affecting how she works in exercises. She is also pointing to her left thumb is problematic. [name] been having some triggering of that thumb with some pain associated with triggering.  HPI  Review of Systems She denies any current illnesses. She denies any chest pain, short of breath, fever, chills, nausea, vomiting, headache  Objective: Vital Signs: There were no vitals taken for this visit.  Physical Exam She is alert and oriented 3 and in no acute distress Ortho Exam Examination of her right knee shows a mild varus deformity. There is significant patellofemoral crepitation. There severe medial joint line tenderness as well. Her knee feels ligamentously stable with good range of motion. There is a mild effusion. Examination of her left thumb shows pain over the A1 pulley and active triggering. Her motion is full. Her left thumb is neurovascularly intact. Specialty Comments:  No specialty comments available.  Imaging: Xr Knee 1-2 Views Right  Result Date: 10/22/2016 An  AP and lateral of her right knee show severe end-stage arthritis. There is a significant varus deformity with complete loss of the medial compartment. There severe para-articular osteophytes throughout the knee. There is significant patellofemoral arthritic disease as well.    PMFS History: Patient Active Problem List   Diagnosis Date Noted  . Unilateral primary osteoarthritis, right knee 10/22/2016  . Trigger thumb, left thumb 10/22/2016  . Morbid obesity ([place]) 06/19/2015  . GERD (gastroesophageal reflux disease) 01/20/2011   . Colon polyp 01/20/2011  . Hemorrhoids 01/20/2011   Past Medical History:  Diagnosis Date  . Anxiety   . Arthritis   . Complication of anesthesia   . Family history of adverse reaction to anesthesia    "sister has hard time waking up"  . GERD (gastroesophageal reflux disease)   . HTN (hypertension)   . Hyperlipidemia   . Hypothyroidism   . Personal history of colonic polyps   . PONV (postoperative nausea and vomiting)   . Rheumatic fever 1958   hospitalized for three months  . Urinary leakage     Family History  Problem Relation Age of Onset  . Diabetes      siblings, mother  . Lung disease Father     black lung, died young  . Heart disease Brother   . Colon cancer Neg Hx     Past Surgical History:  Procedure Laterality Date  . APPENDECTOMY     incidental at time of C-section  . BACK SURGERY  1986  . CESAREAN SECTION  1979  . CHOLECYSTECTOMY  1982  . COLONOSCOPY  2004   Dr. Imogene Burn polyps, left sided diverticula. Path unavailable.   . COLONOSCOPY  02/12/2011   Procedure: COLONOSCOPY;  Surgeon: Daneil Dolin, MD;  [facility];  Service: Endoscopy;  Laterality: N/A;  . ESOPHAGOGASTRODUODENOSCOPY  02/12/2011   Procedure: ESOPHAGOGASTRODUODENOSCOPY (EGD);  Surgeon: Daneil Dolin, MD;  [facility];  Service: Endoscopy;  Laterality: N/A;  . GASTRIC ROUX-EN-Y N/A 06/19/2015   Procedure: LAPAROSCOPIC ROUX-EN-Y GASTRIC BYPASS WITH UPPER ENDOSCOPY;  Surgeon: Excell Seltzer, MD;  Location: [facility];  Service: General;  Laterality: N/A;  . [place]   left  . Roster Shots  2007   ?  Marland Kitchen TOTAL KNEE ARTHROPLASTY  10/2009   left  . UPPER GI ENDOSCOPY  06/19/2015   Procedure: UPPER GI ENDOSCOPY;  Surgeon: Excell Seltzer, MD;  Location: [facility];  Service: General;;   Social History   Occupational History  .  Retired   Social History Main Topics  . Smoking status: Never Smoker  . Smokeless tobacco: Never Used  . Alcohol use No  .  Drug use: No  . Sexual activity: Not on file

## 2016-11-12 ENCOUNTER — Other Ambulatory Visit (INDEPENDENT_AMBULATORY_CARE_PROVIDER_SITE_OTHER): Payer: Self-pay | Admitting: Physician Assistant

## 2016-11-17 ENCOUNTER — Other Ambulatory Visit (INDEPENDENT_AMBULATORY_CARE_PROVIDER_SITE_OTHER): Payer: Self-pay | Admitting: Orthopaedic Surgery

## 2016-11-18 NOTE — Pre-Procedure Instructions (Signed)
[name]  11/18/2016      [place], [place] - [address] [place] [address] Phone: [phone] Fax: [address] [place], [place] [address] Phone: [phone] Fax: [phone]    Your procedure is scheduled on Tues, April 24 @ 2:20 PM  Report to [facility] at 12:20 PM  Call this number if you have problems the morning of surgery:  [phone]   Remember:  Do not eat food or drink liquids after midnight.  Take these medicines the morning of surgery with A SIP OF WATER Lexapro(Escitalopram) and Claritin(Loratadine)              Stop taking any Vitamins or Herbal Medications now. No Aspirin, Goody's,BC's,Aleve,Advil,Motrin,Ibuprofen,or Fish Oil.     Do not wear jewelry, make-up or nail polish.  Do not wear lotions, powders,perfumes, or deoderant.  Do not shave 48 hours prior to surgery.    Do not bring valuables to the hospital.  [facility] is not responsible for any belongings or valuables.  Contacts, dentures or bridgework may not be worn into surgery.  Leave your suitcase in the car.  After surgery it may be brought to your room.  For patients admitted to the hospital, discharge time will be determined by your treatment team.  Patients discharged the day of surgery will not be allowed to drive home.    [facility]  Before surgery, you can play an important role.  Because skin is not sterile, your skin needs to be as free of germs as possible.  You can reduce the number of germs on you skin by washing with CHG (chlorahexidine gluconate) soap before surgery.  CHG is an antiseptic cleaner which kills germs and bonds with the skin to continue killing germs even after washing.  Please DO NOT use if you have an allergy to CHG or antibacterial soaps.  If your skin becomes  reddened/irritated stop using the CHG and inform your nurse when you arrive at Short Stay.  Do not shave (including legs and underarms) for at least 48 hours prior to the first CHG shower.  You may shave your face.  Please follow these instructions carefully:   1.  Shower with CHG Soap the night before surgery and the                                morning of Surgery.  2.  If you choose to wash your hair, wash your hair first as usual with your       normal shampoo.  3.  After you shampoo, rinse your hair and body thoroughly to remove the                      Shampoo.  4.  Use CHG as you would any other liquid soap.  You can apply chg directly       to the skin and wash gently with scrungie or a clean washcloth.  5.  Apply the CHG Soap to your body ONLY FROM THE NECK DOWN.        Do not use on open wounds or open sores.  Avoid contact with your eyes,       ears, mouth and genitals (private parts).  Wash genitals (private parts)       with your normal soap.  6.  Wash thoroughly, paying special attention to the area where your surgery        will be performed.  7.  Thoroughly rinse your body with warm water from the neck down.  8.  DO NOT shower/wash with your normal soap after using and rinsing off       the CHG Soap.  9.  Pat yourself dry with a clean towel.            10.  Wear clean pajamas.            11.  Place clean sheets on your bed the night of your first shower and do not        sleep with pets.  Day of Surgery  Do not apply any lotions/deoderants the morning of surgery.  Please wear clean clothes to the hospital/surgery center.    Please read over the following fact sheets that you were given. Pain Booklet, Coughing and Deep Breathing, MRSA Information and Surgical Site Infection Prevention

## 2016-11-19 ENCOUNTER — Encounter (HOSPITAL_COMMUNITY)
Admission: RE | Admit: 2016-11-19 | Discharge: 2016-11-19 | Disposition: A | Discharge status: Home / Self Care | Payer: Medicare Other | Source: Ambulatory Visit | Attending: Orthopaedic Surgery | Admitting: Orthopaedic Surgery

## 2016-11-19 ENCOUNTER — Encounter (HOSPITAL_COMMUNITY): Payer: Self-pay

## 2016-11-19 DIAGNOSIS — K219 Gastro-esophageal reflux disease without esophagitis: Secondary | ICD-10-CM | POA: Insufficient documentation

## 2016-11-19 DIAGNOSIS — M1711 Unilateral primary osteoarthritis, right knee: Secondary | ICD-10-CM | POA: Diagnosis not present

## 2016-11-19 DIAGNOSIS — Z01812 Encounter for preprocedural laboratory examination: Secondary | ICD-10-CM | POA: Insufficient documentation

## 2016-11-19 DIAGNOSIS — E039 Hypothyroidism, unspecified: Secondary | ICD-10-CM | POA: Diagnosis not present

## 2016-11-19 HISTORY — DX: Effusion, unspecified joint: M25.40

## 2016-11-19 HISTORY — DX: Personal history of other diseases of the respiratory system: Z87.09

## 2016-11-19 HISTORY — DX: Personal history of other diseases of the nervous system and sense organs: Z86.69

## 2016-11-19 HISTORY — DX: Depression, unspecified: F32.A

## 2016-11-19 HISTORY — DX: Major depressive disorder, single episode, unspecified: F32.9

## 2016-11-19 HISTORY — DX: Vitamin D deficiency, unspecified: E55.9

## 2016-11-19 HISTORY — DX: Pain in unspecified joint: M25.50

## 2016-11-19 HISTORY — DX: Allergy, unspecified, initial encounter: T78.40XA

## 2016-11-19 HISTORY — DX: Personal history of other venous thrombosis and embolism: Z86.718

## 2016-11-19 HISTORY — DX: Constipation, unspecified: K59.00

## 2016-11-19 LAB — CBC
HEMATOCRIT: 44.2 % (ref 36.0–46.0)
HEMOGLOBIN: 14.6 g/dL (ref 12.0–15.0)
MCH: 32.7 pg (ref 26.0–34.0)
MCHC: 33 g/dL (ref 30.0–36.0)
MCV: 99.1 fL (ref 78.0–100.0)
Platelets: 149 10*3/uL — ABNORMAL LOW (ref 150–400)
RBC: 4.46 MIL/uL (ref 3.87–5.11)
RDW: 13.5 % (ref 11.5–15.5)
WBC: 4.7 10*3/uL (ref 4.0–10.5)

## 2016-11-19 LAB — BASIC METABOLIC PANEL
Anion gap: 6 (ref 5–15)
BUN: 21 mg/dL — AB (ref 6–20)
CHLORIDE: 110 mmol/L (ref 101–111)
CO2: 27 mmol/L (ref 22–32)
CREATININE: 0.64 mg/dL (ref 0.44–1.00)
Calcium: 9.4 mg/dL (ref 8.9–10.3)
GFR calc Af Amer: >60 mL/min (ref >60)
GFR calc non Af Amer: >60 mL/min (ref >60)
GLUCOSE: 80 mg/dL (ref 65–99)
Potassium: 3.8 mmol/L (ref 3.5–5.1)
SODIUM: 143 mmol/L (ref 135–145)

## 2016-11-19 LAB — SURGICAL PCR SCREEN
MRSA, PCR: NEGATIVE
Staphylococcus aureus: NEGATIVE

## 2016-11-19 MED ORDER — CHLORHEXIDINE GLUCONATE 4 % EX LIQD: 60.0000 mL | Freq: Once | CUTANEOUS | Status: DC

## 2016-11-19 NOTE — Progress Notes (Addendum)
Cardiologist-saw Truitt Merle NP in [phone] and says she will follow up in a yr. States only reason they want to continue seeing her is bc she had rheumatic fever at age 76  [place] is Dr.John Nevada Crane in [place]    Echo Report in epic from 2016/2017  Stress test done 12+ yrs ago  Heart cath denies  EKG in epic from 06-25-16  CXR denies in past yr

## 2016-11-24 MED ORDER — TRANEXAMIC ACID 1000 MG/10ML IV SOLN
1000.0000 mg | INTRAVENOUS | Status: AC
  Administered 2016-11-25: 1000 mg via INTRAVENOUS
  Filled 2016-11-24: qty 10

## 2016-11-24 MED ORDER — CEFAZOLIN SODIUM-DEXTROSE 2-4 GM/100ML-% IV SOLN
2.0000 g | INTRAVENOUS | Status: AC
  Administered 2016-11-25: 2 g via INTRAVENOUS
  Filled 2016-11-24: qty 100

## 2016-11-25 ENCOUNTER — Inpatient Hospital Stay (HOSPITAL_COMMUNITY): Payer: Medicare Other

## 2016-11-25 ENCOUNTER — Encounter (HOSPITAL_COMMUNITY): Payer: Self-pay | Admitting: Certified Registered Nurse Anesthetist

## 2016-11-25 ENCOUNTER — Ambulatory Visit (HOSPITAL_COMMUNITY): Payer: Medicare Other | Admitting: Anesthesiology

## 2016-11-25 ENCOUNTER — Inpatient Hospital Stay (HOSPITAL_COMMUNITY)
Admission: RE | Admit: 2016-11-25 | Discharge: 2016-11-27 | DRG: 470 | Disposition: A | Discharge status: Home / Self Care | Payer: Medicare Other | Source: Ambulatory Visit | Attending: Orthopaedic Surgery | Admitting: Orthopaedic Surgery

## 2016-11-25 ENCOUNTER — Encounter (HOSPITAL_COMMUNITY)
Admission: RE | Disposition: A | Discharge status: Home / Self Care | Payer: Self-pay | Source: Ambulatory Visit | Attending: Orthopaedic Surgery

## 2016-11-25 DIAGNOSIS — Z96651 Presence of right artificial knee joint: Secondary | ICD-10-CM | POA: Diagnosis not present

## 2016-11-25 DIAGNOSIS — Z79899 Other long term (current) drug therapy: Secondary | ICD-10-CM

## 2016-11-25 DIAGNOSIS — K649 Unspecified hemorrhoids: Secondary | ICD-10-CM | POA: Diagnosis not present

## 2016-11-25 DIAGNOSIS — F329 Major depressive disorder, single episode, unspecified: Secondary | ICD-10-CM | POA: Diagnosis present

## 2016-11-25 DIAGNOSIS — G8918 Other acute postprocedural pain: Secondary | ICD-10-CM | POA: Diagnosis not present

## 2016-11-25 DIAGNOSIS — E559 Vitamin D deficiency, unspecified: Secondary | ICD-10-CM | POA: Diagnosis present

## 2016-11-25 DIAGNOSIS — K5900 Constipation, unspecified: Secondary | ICD-10-CM | POA: Diagnosis present

## 2016-11-25 DIAGNOSIS — M1711 Unilateral primary osteoarthritis, right knee: Secondary | ICD-10-CM | POA: Diagnosis not present

## 2016-11-25 DIAGNOSIS — Z96652 Presence of left artificial knee joint: Secondary | ICD-10-CM | POA: Diagnosis present

## 2016-11-25 DIAGNOSIS — K219 Gastro-esophageal reflux disease without esophagitis: Secondary | ICD-10-CM | POA: Diagnosis present

## 2016-11-25 DIAGNOSIS — E039 Hypothyroidism, unspecified: Secondary | ICD-10-CM | POA: Diagnosis present

## 2016-11-25 HISTORY — PX: TOTAL KNEE ARTHROPLASTY: SHX125

## 2016-11-25 SURGERY — ARTHROPLASTY, KNEE, TOTAL
Anesthesia: Spinal | Site: Knee | Laterality: Right

## 2016-11-25 MED ORDER — SODIUM CHLORIDE 0.9 % IV SOLN
INTRAVENOUS | Status: DC
  Administered 2016-11-25: 18:00:00 via INTRAVENOUS

## 2016-11-25 MED ORDER — DEXTROSE 5 % IV SOLN
500.0000 mg | Freq: Four times a day (QID) | INTRAVENOUS | Status: DC | PRN
  Administered 2016-11-26: 500 mg via INTRAVENOUS
  Filled 2016-11-25: qty 5

## 2016-11-25 MED ORDER — FENTANYL CITRATE (PF) 250 MCG/5ML IJ SOLN
INTRAMUSCULAR | Status: DC | PRN
  Administered 2016-11-25: 50 ug via INTRAVENOUS

## 2016-11-25 MED ORDER — PROPOFOL 10 MG/ML IV BOLUS
INTRAVENOUS | Status: DC | PRN
  Administered 2016-11-25: 20 mg via INTRAVENOUS
  Administered 2016-11-25: 40 mg via INTRAVENOUS

## 2016-11-25 MED ORDER — LIDOCAINE 2% (20 MG/ML) 5 ML SYRINGE
INTRAMUSCULAR | Status: AC
  Filled 2016-11-25: qty 5

## 2016-11-25 MED ORDER — METHOCARBAMOL 500 MG PO TABS
500.0000 mg | ORAL_TABLET | Freq: Four times a day (QID) | ORAL | Status: DC | PRN
  Administered 2016-11-25: 500 mg via ORAL
  Filled 2016-11-25 (×2): qty 1

## 2016-11-25 MED ORDER — ACETAMINOPHEN 650 MG RE SUPP: 650.0000 mg | Freq: Four times a day (QID) | RECTAL | Status: DC | PRN

## 2016-11-25 MED ORDER — PROPOFOL 10 MG/ML IV BOLUS
INTRAVENOUS | Status: AC
  Filled 2016-11-25: qty 20

## 2016-11-25 MED ORDER — ONDANSETRON HCL 4 MG/2ML IJ SOLN
INTRAMUSCULAR | Status: DC | PRN
  Administered 2016-11-25: 4 mg via INTRAVENOUS

## 2016-11-25 MED ORDER — LORATADINE 10 MG PO TABS
10.0000 mg | ORAL_TABLET | Freq: Every day | ORAL | Status: DC
  Administered 2016-11-26 – 2016-11-27 (×2): 10 mg via ORAL
  Filled 2016-11-25 (×2): qty 1

## 2016-11-25 MED ORDER — MENTHOL 3 MG MT LOZG: 1.0000 | LOZENGE | OROMUCOSAL | Status: DC | PRN

## 2016-11-25 MED ORDER — ASPIRIN EC 325 MG PO TBEC
325.0000 mg | DELAYED_RELEASE_TABLET | Freq: Two times a day (BID) | ORAL | Status: DC
  Administered 2016-11-26 – 2016-11-27 (×3): 325 mg via ORAL
  Filled 2016-11-25 (×3): qty 1

## 2016-11-25 MED ORDER — FENTANYL CITRATE (PF) 250 MCG/5ML IJ SOLN
INTRAMUSCULAR | Status: AC
  Filled 2016-11-25: qty 5

## 2016-11-25 MED ORDER — LEVOTHYROXINE SODIUM 100 MCG PO TABS
100.0000 ug | ORAL_TABLET | Freq: Every day | ORAL | Status: DC
  Administered 2016-11-26 – 2016-11-27 (×2): 100 ug via ORAL
  Filled 2016-11-25 (×2): qty 1

## 2016-11-25 MED ORDER — GABAPENTIN 300 MG PO CAPS
300.0000 mg | ORAL_CAPSULE | Freq: Once | ORAL | Status: AC
  Administered 2016-11-25: 300 mg via ORAL

## 2016-11-25 MED ORDER — OXYCODONE HCL 5 MG PO TABS
5.0000 mg | ORAL_TABLET | ORAL | Status: DC | PRN
  Administered 2016-11-25 (×2): 10 mg via ORAL
  Administered 2016-11-26 (×2): 5 mg via ORAL
  Administered 2016-11-26 – 2016-11-27 (×3): 10 mg via ORAL
  Filled 2016-11-25 (×3): qty 2
  Filled 2016-11-25 (×2): qty 1
  Filled 2016-11-25 (×2): qty 2
  Filled 2016-11-25: qty 1

## 2016-11-25 MED ORDER — VITAMIN D 1000 UNITS PO TABS
1000.0000 [IU] | ORAL_TABLET | Freq: Every day | ORAL | Status: DC
  Administered 2016-11-26 – 2016-11-27 (×2): 1000 [IU] via ORAL
  Filled 2016-11-25 (×2): qty 1

## 2016-11-25 MED ORDER — PROPOFOL 500 MG/50ML IV EMUL
INTRAVENOUS | Status: DC | PRN
  Administered 2016-11-25: 25 ug/kg/min via INTRAVENOUS

## 2016-11-25 MED ORDER — BUPIVACAINE IN DEXTROSE 0.75-8.25 % IT SOLN
INTRATHECAL | Status: DC | PRN
  Administered 2016-11-25: 15 mg via INTRATHECAL

## 2016-11-25 MED ORDER — MIDAZOLAM HCL 2 MG/2ML IJ SOLN
INTRAMUSCULAR | Status: AC
  Filled 2016-11-25: qty 2

## 2016-11-25 MED ORDER — PROPOFOL 1000 MG/100ML IV EMUL
INTRAVENOUS | Status: AC
  Filled 2016-11-25: qty 100

## 2016-11-25 MED ORDER — SODIUM CHLORIDE 0.9 % IR SOLN
Status: DC | PRN
  Administered 2016-11-25: 3000 mL

## 2016-11-25 MED ORDER — METOCLOPRAMIDE HCL 5 MG/ML IJ SOLN: 5.0000 mg | Freq: Three times a day (TID) | INTRAMUSCULAR | Status: DC | PRN

## 2016-11-25 MED ORDER — PHENOL 1.4 % MT LIQD: 1.0000 | OROMUCOSAL | Status: DC | PRN

## 2016-11-25 MED ORDER — ACETAMINOPHEN 325 MG PO TABS: 650.0000 mg | ORAL_TABLET | Freq: Four times a day (QID) | ORAL | Status: DC | PRN

## 2016-11-25 MED ORDER — MIDAZOLAM HCL 2 MG/2ML IJ SOLN
1.0000 mg | Freq: Once | INTRAMUSCULAR | Status: AC
  Administered 2016-11-25: 1 mg via INTRAVENOUS

## 2016-11-25 MED ORDER — ROPIVACAINE HCL 5 MG/ML IJ SOLN
INTRAMUSCULAR | Status: DC | PRN
  Administered 2016-11-25: 30 mL via PERINEURAL

## 2016-11-25 MED ORDER — LACTATED RINGERS IV SOLN
INTRAVENOUS | Status: DC
  Administered 2016-11-25 (×2): via INTRAVENOUS

## 2016-11-25 MED ORDER — ADULT MULTIVITAMIN W/MINERALS CH
1.0000 | ORAL_TABLET | Freq: Two times a day (BID) | ORAL | Status: DC
  Administered 2016-11-25 – 2016-11-27 (×4): 1 via ORAL
  Filled 2016-11-25 (×4): qty 1

## 2016-11-25 MED ORDER — CYANOCOBALAMIN 500 MCG PO TABS
2500.0000 ug | ORAL_TABLET | Freq: Every day | ORAL | Status: DC
  Administered 2016-11-26 – 2016-11-27 (×2): 2500 ug via ORAL
  Filled 2016-11-25 (×2): qty 5

## 2016-11-25 MED ORDER — DOCUSATE SODIUM 100 MG PO CAPS
100.0000 mg | ORAL_CAPSULE | Freq: Two times a day (BID) | ORAL | Status: DC
  Administered 2016-11-25 – 2016-11-27 (×4): 100 mg via ORAL
  Filled 2016-11-25 (×4): qty 1

## 2016-11-25 MED ORDER — HYDROMORPHONE HCL 1 MG/ML IJ SOLN
1.0000 mg | INTRAMUSCULAR | Status: DC | PRN
  Administered 2016-11-26: 1 mg via INTRAVENOUS
  Filled 2016-11-25: qty 1

## 2016-11-25 MED ORDER — FENTANYL CITRATE (PF) 100 MCG/2ML IJ SOLN
100.0000 ug | Freq: Once | INTRAMUSCULAR | Status: AC
  Administered 2016-11-25: 100 ug via INTRAVENOUS

## 2016-11-25 MED ORDER — OXYBUTYNIN CHLORIDE 5 MG PO TABS
5.0000 mg | ORAL_TABLET | Freq: Every evening | ORAL | Status: DC
  Administered 2016-11-26: 5 mg via ORAL
  Filled 2016-11-25: qty 1

## 2016-11-25 MED ORDER — ALUM & MAG HYDROXIDE-SIMETH 200-200-20 MG/5ML PO SUSP: 30.0000 mL | ORAL | Status: DC | PRN

## 2016-11-25 MED ORDER — VITAMIN C 500 MG PO TABS
500.0000 mg | ORAL_TABLET | Freq: Two times a day (BID) | ORAL | Status: DC
Start: 2016-11-25 — End: 2016-11-27
  Administered 2016-11-25 – 2016-11-27 (×4): 500 mg via ORAL
  Filled 2016-11-25 (×5): qty 1

## 2016-11-25 MED ORDER — FENTANYL CITRATE (PF) 100 MCG/2ML IJ SOLN
INTRAMUSCULAR | Status: AC
  Administered 2016-11-25: 100 ug via INTRAVENOUS
  Filled 2016-11-25: qty 2

## 2016-11-25 MED ORDER — CELECOXIB 200 MG PO CAPS
ORAL_CAPSULE | ORAL | Status: AC
  Administered 2016-11-25: 200 mg via ORAL
  Filled 2016-11-25: qty 1

## 2016-11-25 MED ORDER — CALCIUM CARBONATE-VITAMIN D 500-200 MG-UNIT PO TABS
1.0000 | ORAL_TABLET | Freq: Three times a day (TID) | ORAL | Status: DC
  Administered 2016-11-26 – 2016-11-27 (×3): 1 via ORAL
  Filled 2016-11-25 (×5): qty 1

## 2016-11-25 MED ORDER — ONDANSETRON HCL 4 MG/2ML IJ SOLN
INTRAMUSCULAR | Status: AC
  Filled 2016-11-25: qty 2

## 2016-11-25 MED ORDER — KETOROLAC TROMETHAMINE 15 MG/ML IJ SOLN
7.5000 mg | Freq: Four times a day (QID) | INTRAMUSCULAR | Status: AC
  Administered 2016-11-25 – 2016-11-26 (×3): 7.5 mg via INTRAVENOUS
  Filled 2016-11-25 (×4): qty 1

## 2016-11-25 MED ORDER — GLYCOPYRROLATE 0.2 MG/ML IJ SOLN
INTRAMUSCULAR | Status: DC | PRN
  Administered 2016-11-25: .2 mg via INTRAVENOUS

## 2016-11-25 MED ORDER — ACETAMINOPHEN 500 MG PO TABS
1000.0000 mg | ORAL_TABLET | Freq: Once | ORAL | Status: AC
  Administered 2016-11-25: 1000 mg via ORAL

## 2016-11-25 MED ORDER — METOCLOPRAMIDE HCL 5 MG PO TABS: 5.0000 mg | ORAL_TABLET | Freq: Three times a day (TID) | ORAL | Status: DC | PRN

## 2016-11-25 MED ORDER — PHENYLEPHRINE HCL 10 MG/ML IJ SOLN
INTRAVENOUS | Status: DC | PRN
  Administered 2016-11-25: 25 ug/min via INTRAVENOUS

## 2016-11-25 MED ORDER — 0.9 % SODIUM CHLORIDE (POUR BTL) OPTIME
TOPICAL | Status: DC | PRN
  Administered 2016-11-25: 1000 mL

## 2016-11-25 MED ORDER — GABAPENTIN 300 MG PO CAPS
ORAL_CAPSULE | ORAL | Status: AC
  Administered 2016-11-25: 300 mg via ORAL
  Filled 2016-11-25: qty 1

## 2016-11-25 MED ORDER — CEFAZOLIN SODIUM-DEXTROSE 1-4 GM/50ML-% IV SOLN
1.0000 g | Freq: Four times a day (QID) | INTRAVENOUS | Status: AC
  Administered 2016-11-25 – 2016-11-26 (×2): 1 g via INTRAVENOUS
  Filled 2016-11-25 (×2): qty 50

## 2016-11-25 MED ORDER — POLYETHYLENE GLYCOL 3350 17 G PO PACK: 17.0000 g | PACK | Freq: Every day | ORAL | Status: DC | PRN

## 2016-11-25 MED ORDER — CELECOXIB 200 MG PO CAPS
200.0000 mg | ORAL_CAPSULE | Freq: Once | ORAL | Status: AC
  Administered 2016-11-25: 200 mg via ORAL

## 2016-11-25 MED ORDER — DIPHENHYDRAMINE HCL 12.5 MG/5ML PO ELIX: 12.5000 mg | ORAL_SOLUTION | ORAL | Status: DC | PRN

## 2016-11-25 MED ORDER — HYDROMORPHONE HCL 1 MG/ML IJ SOLN: 0.2500 mg | INTRAMUSCULAR | Status: DC | PRN

## 2016-11-25 MED ORDER — LIDOCAINE HCL (CARDIAC) 20 MG/ML IV SOLN
INTRAVENOUS | Status: DC | PRN
  Administered 2016-11-25: 60 mg via INTRATRACHEAL

## 2016-11-25 MED ORDER — ESCITALOPRAM OXALATE 10 MG PO TABS
20.0000 mg | ORAL_TABLET | Freq: Every day | ORAL | Status: DC
  Administered 2016-11-26 – 2016-11-27 (×2): 20 mg via ORAL
  Filled 2016-11-25 (×2): qty 2

## 2016-11-25 MED ORDER — ACETAMINOPHEN 500 MG PO TABS
ORAL_TABLET | ORAL | Status: AC
  Administered 2016-11-25: 1000 mg via ORAL
  Filled 2016-11-25: qty 2

## 2016-11-25 MED ORDER — ONDANSETRON HCL 4 MG PO TABS: 4.0000 mg | ORAL_TABLET | Freq: Four times a day (QID) | ORAL | Status: DC | PRN

## 2016-11-25 MED ORDER — ONDANSETRON HCL 4 MG/2ML IJ SOLN: 4.0000 mg | Freq: Four times a day (QID) | INTRAMUSCULAR | Status: DC | PRN

## 2016-11-25 MED ORDER — MIDAZOLAM HCL 2 MG/2ML IJ SOLN
INTRAMUSCULAR | Status: AC
  Administered 2016-11-25: 1 mg via INTRAVENOUS
  Filled 2016-11-25: qty 2

## 2016-11-25 SURGICAL SUPPLY — 66 items
BANDAGE ACE 6X5 VEL STRL LF (GAUZE/BANDAGES/DRESSINGS) ×2 IMPLANT
BANDAGE ELASTIC 6 VELCRO ST LF (GAUZE/BANDAGES/DRESSINGS) ×2 IMPLANT
BANDAGE ESMARK 6X9 LF (GAUZE/BANDAGES/DRESSINGS) ×1 IMPLANT
BLADE SAG 18X100X1.27 (BLADE) ×5 IMPLANT
BNDG CMPR 9X6 STRL LF SNTH (GAUZE/BANDAGES/DRESSINGS) ×1
BNDG ESMARK 6X9 LF (GAUZE/BANDAGES/DRESSINGS) ×3
BOWL SMART MIX CTS (DISPOSABLE) ×3 IMPLANT
CAPT KNEE TOTAL 3 ×2 IMPLANT
CEMENT BONE SIMPLEX SPEEDSET (Cement) ×6 IMPLANT
CLOSURE WOUND 1/2 X4 (GAUZE/BANDAGES/DRESSINGS) ×2
COVER SURGICAL LIGHT HANDLE (MISCELLANEOUS) ×3 IMPLANT
CUFF TOURNIQUET SINGLE 34IN LL (TOURNIQUET CUFF) ×3 IMPLANT
CUFF TOURNIQUET SINGLE 44IN (TOURNIQUET CUFF) IMPLANT
DRAPE EXTREMITY T 121X128X90 (DRAPE) ×3 IMPLANT
DRAPE HALF SHEET 40X57 (DRAPES) ×1 IMPLANT
DRAPE U-SHAPE 47X51 STRL (DRAPES) ×3 IMPLANT
DRSG PAD ABDOMINAL 8X10 ST (GAUZE/BANDAGES/DRESSINGS) ×5 IMPLANT
DURAPREP 26ML APPLICATOR (WOUND CARE) ×3 IMPLANT
ELECT CAUTERY BLADE 6.4 (BLADE) ×3 IMPLANT
ELECT REM PT RETURN 9FT ADLT (ELECTROSURGICAL) ×3
ELECTRODE REM PT RTRN 9FT ADLT (ELECTROSURGICAL) ×1 IMPLANT
FACESHIELD WRAPAROUND (MASK) ×12 IMPLANT
FACESHIELD WRAPAROUND OR TEAM (MASK) ×2 IMPLANT
GAUZE SPONGE 4X4 12PLY STRL (GAUZE/BANDAGES/DRESSINGS) ×3 IMPLANT
GAUZE XEROFORM 1X8 LF (GAUZE/BANDAGES/DRESSINGS) ×1 IMPLANT
GLOVE BIOGEL PI IND STRL 8 (GLOVE) ×2 IMPLANT
GLOVE BIOGEL PI INDICATOR 8 (GLOVE) ×4
GLOVE ORTHO TXT STRL SZ7.5 (GLOVE) ×3 IMPLANT
GLOVE SURG ORTHO 8.0 STRL STRW (GLOVE) ×3 IMPLANT
GOWN STRL REUS W/ TWL LRG LVL3 (GOWN DISPOSABLE) IMPLANT
GOWN STRL REUS W/ TWL XL LVL3 (GOWN DISPOSABLE) ×2 IMPLANT
GOWN STRL REUS W/TWL LRG LVL3 (GOWN DISPOSABLE)
GOWN STRL REUS W/TWL XL LVL3 (GOWN DISPOSABLE) ×6
HANDPIECE INTERPULSE COAX TIP (DISPOSABLE) ×3
IMMOBILIZER KNEE 22 UNIV (SOFTGOODS) ×3 IMPLANT
KIT BASIN OR (CUSTOM PROCEDURE TRAY) ×3 IMPLANT
KIT ROOM TURNOVER OR (KITS) ×3 IMPLANT
MANIFOLD NEPTUNE II (INSTRUMENTS) ×3 IMPLANT
NDL SAFETY ECLIPSE 18X1.5 (NEEDLE) IMPLANT
NEEDLE HYPO 18GX1.5 SHARP (NEEDLE)
NS IRRIG 1000ML POUR BTL (IV SOLUTION) ×3 IMPLANT
PACK TOTAL JOINT (CUSTOM PROCEDURE TRAY) ×3 IMPLANT
PAD ARMBOARD 7.5X6 YLW CONV (MISCELLANEOUS) ×3 IMPLANT
PAD CAST 4YDX4 CTTN HI CHSV (CAST SUPPLIES) IMPLANT
PADDING CAST COTTON 4X4 STRL (CAST SUPPLIES) ×3
PADDING CAST COTTON 6X4 STRL (CAST SUPPLIES) ×1 IMPLANT
PADDING CAST SYNTHETIC 3 NS LF (CAST SUPPLIES) ×2
PADDING CAST SYNTHETIC 3X4 NS (CAST SUPPLIES) IMPLANT
SET HNDPC FAN SPRY TIP SCT (DISPOSABLE) ×1 IMPLANT
SET PAD KNEE POSITIONER (MISCELLANEOUS) ×3 IMPLANT
STAPLER VISISTAT 35W (STAPLE) IMPLANT
STRIP CLOSURE SKIN 1/2X4 (GAUZE/BANDAGES/DRESSINGS) ×2 IMPLANT
SUCTION FRAZIER HANDLE 10FR (MISCELLANEOUS) ×2
SUCTION TUBE FRAZIER 10FR DISP (MISCELLANEOUS) ×1 IMPLANT
SUT MNCRL AB 4-0 PS2 18 (SUTURE) ×2 IMPLANT
SUT VIC AB 0 CT1 27 (SUTURE) ×3
SUT VIC AB 0 CT1 27XBRD ANBCTR (SUTURE) ×1 IMPLANT
SUT VIC AB 1 CT1 27 (SUTURE) ×6
SUT VIC AB 1 CT1 27XBRD ANBCTR (SUTURE) ×2 IMPLANT
SUT VIC AB 2-0 CT1 27 (SUTURE) ×6
SUT VIC AB 2-0 CT1 TAPERPNT 27 (SUTURE) ×2 IMPLANT
SYR 50ML LL SCALE MARK (SYRINGE) IMPLANT
TOWEL OR 17X24 6PK STRL BLUE (TOWEL DISPOSABLE) ×1 IMPLANT
TOWEL OR 17X26 10 PK STRL BLUE (TOWEL DISPOSABLE) ×1 IMPLANT
TRAY CATH 16FR W/PLASTIC CATH (SET/KITS/TRAYS/PACK) ×2 IMPLANT
WRAP KNEE MAXI GEL POST OP (GAUZE/BANDAGES/DRESSINGS) ×3 IMPLANT

## 2016-11-25 NOTE — H&P (Signed)
TOTAL KNEE ADMISSION H&P  Patient is being admitted for right total knee arthroplasty.  Subjective:  Chief Complaint:right knee pain.  HPI: [name], 76 y.o. female, has a history of pain and functional disability in the right knee due to arthritis and has failed non-surgical conservative treatments for greater than 12 weeks to includeNSAID's and/or analgesics, corticosteriod injections, viscosupplementation injections, flexibility and strengthening excercises, use of assistive devices, weight reduction as appropriate and activity modification.  Onset of symptoms was gradual, starting 5 years ago with gradually worsening course since that time. The patient noted no past surgery on the right knee(s).  Patient currently rates pain in the right knee(s) at 9 out of 10 with activity. Patient has night pain, worsening of pain with activity and weight bearing, pain that interferes with activities of daily living, pain with passive range of motion, crepitus and joint swelling.  Patient has evidence of subchondral sclerosis, periarticular osteophytes and joint space narrowing by imaging studies. There is no active infection.  Patient Active Problem List   Diagnosis Date Noted  . Unilateral primary osteoarthritis, right knee 10/22/2016  . Trigger thumb, left thumb 10/22/2016  . Morbid obesity ([place]) 06/19/2015  . GERD (gastroesophageal reflux disease) 01/20/2011  . Colon polyp 01/20/2011  . Hemorrhoids 01/20/2011   Past Medical History:  Diagnosis Date  . Allergy    takes Claritin daily  . Arthritis   . Complication of anesthesia   . Constipation    takes Senokot daily  . Depression    takes Lexapro daily  . Family history of adverse reaction to anesthesia    sister has a hard time waking up  . GERD (gastroesophageal reflux disease)   . History of blood clots 1979   after C/S clot went to lung  . History of bronchitis    3 yrs ago  . History of migraine   . Hypothyroidism    takes  Synthroid daily  . Joint pain   . Joint swelling   . Personal history of colonic polyps    benign  . PONV (postoperative nausea and vomiting)   . Rheumatic fever 1958   hospitalized for three months  . Urinary leakage    takes Ditropan daily  . Vitamin D deficiency    takes Vit D daily    Past Surgical History:  Procedure Laterality Date  . APPENDECTOMY     incidental at time of C-section  . BACK SURGERY  1986  . cataract surgery Bilateral   . CESAREAN SECTION  1979  . CHOLECYSTECTOMY  1982  . COLONOSCOPY  2004   Dr. Imogene Burn polyps, left sided diverticula. Path unavailable.   . COLONOSCOPY  02/12/2011   Procedure: COLONOSCOPY;  Surgeon: Daneil Dolin, MD;  [facility];  Service: Endoscopy;  Laterality: N/A;  . ESOPHAGOGASTRODUODENOSCOPY  02/12/2011   Procedure: ESOPHAGOGASTRODUODENOSCOPY (EGD);  Surgeon: Daneil Dolin, MD;  Location: [facility];  Service: Endoscopy;  Laterality: N/A;  . GASTRIC ROUX-EN-Y N/A 06/19/2015   Procedure: LAPAROSCOPIC ROUX-EN-Y GASTRIC BYPASS WITH UPPER ENDOSCOPY;  Surgeon: Excell Seltzer, MD;  Location: [facility] OR[facility];  Service: General;  Laterality: N/A;  . [place]   left  . TOTAL KNEE ARTHROPLASTY  10/2009   left  . UPPER GI ENDOSCOPY  06/19/2015   Procedure: UPPER GI ENDOSCOPY;  Surgeon: Excell Seltzer, MD;  Location: [facility] ORS;  Service: General;;    Prescriptions Prior to Admission  Medication Sig Dispense Refill Last Dose  . acetaminophen (  TYLENOL) 325 MG tablet Take 325 mg by mouth every 6 (six) hours as needed for mild pain or headache.   Taking  . Ascorbic Acid (VITAMIN C) 500 MG CHEW Chew 500 mg by mouth 2 (two) times daily.     . Calcium 600-400 MG-UNIT CHEW Chew 1 tablet by mouth 3 (three) times daily.     . Calcium Polycarbophil (FIBERCON PO) Take 1 tablet by mouth at bedtime.     . cholecalciferol (VITAMIN D) 1000 units tablet Take 1,000 Units by mouth daily.   Taking  . Cyanocobalamin (VITAMIN B-12) 2500  MCG SUBL Place 2,500 mcg under the tongue daily.    Taking  . escitalopram (LEXAPRO) 20 MG tablet Take 20 mg by mouth daily.     Taking  . levothyroxine (SYNTHROID, LEVOTHROID) 100 MCG tablet Take 100 mcg by mouth every evening.    Taking  . loratadine (CLARITIN) 10 MG tablet Take 10 mg by mouth daily.   Taking  . Multiple Vitamin (MULTIVITAMIN WITH MINERALS) TABS tablet Take 1 tablet by mouth 2 (two) times daily.    Taking  . oxybutynin (DITROPAN) 5 MG tablet Take 5 mg by mouth every evening.    Taking  . sennosides-docusate sodium (SENOKOT-S) 8.6-50 MG tablet Take 2 tablets by mouth at bedtime.    Taking   Allergies  Allergen Reactions  . No Known Allergies     Social History  Substance Use Topics  . Smoking status: Never Smoker  . Smokeless tobacco: Never Used  . Alcohol use No    Family History  Problem Relation Age of Onset  . Diabetes      siblings, mother  . Lung disease Father     black lung, died young  . Heart disease Brother   . Colon cancer Neg Hx      Review of Systems  Musculoskeletal: Positive for joint pain.  All other systems reviewed and are negative.   Objective:  Physical Exam  Constitutional: She is oriented to person, place, and time. She appears well-developed and well-nourished.  HENT:  Head: Normocephalic and atraumatic.  Eyes: EOM are normal. Pupils are equal, round, and reactive to light.  Neck: Normal range of motion. Neck supple.  Cardiovascular: Normal rate and regular rhythm.   Respiratory: Effort normal and breath sounds normal.  GI: Soft. Bowel sounds are normal.  Musculoskeletal:       Right knee: She exhibits decreased range of motion, effusion and abnormal alignment. Tenderness found. Medial joint line and lateral joint line tenderness noted.  Neurological: She is alert and oriented to person, place, and time.  Skin: Skin is warm and dry.  Psychiatric: She has a normal mood and affect.    Vital signs in last 24 hours: Temp:   [97.6 F (36.4 C)] 97.6 F (36.4 C) (04/24 1136) Pulse Rate:  [52] 52 (04/24 1136) Resp:  [18] 18 (04/24 1136) BP: (136)/(62) 136/62 (04/24 1136) SpO2:  [100 %] 100 % (04/24 1136) Weight:  [178 lb 9.6 oz (81 kg)] 178 lb 9.6 oz (81 kg) (04/24 1136)  Labs:   Estimated body mass index is 27.16 kg/m as calculated from the following:   Height as of this encounter: 5\' 8"  (1.727 m).   Weight as of this encounter: 178 lb 9.6 oz (81 kg).   Imaging Review Plain radiographs demonstrate severe degenerative joint disease of the right knee(s). The overall alignment ismild varus. The bone quality appears to be good for age and reported activity  level.  Assessment/Plan:  End stage arthritis, right knee   The patient history, physical examination, clinical judgment of the provider and imaging studies are consistent with end stage degenerative joint disease of the right knee(s) and total knee arthroplasty is deemed medically necessary. The treatment options including medical management, injection therapy arthroscopy and arthroplasty were discussed at length. The risks and benefits of total knee arthroplasty were presented and reviewed. The risks due to aseptic loosening, infection, stiffness, patella tracking problems, thromboembolic complications and other imponderables were discussed. The patient acknowledged the explanation, agreed to proceed with the plan and consent was signed. Patient is being admitted for inpatient treatment for surgery, pain control, PT, OT, prophylactic antibiotics, VTE prophylaxis, progressive ambulation and ADL's and discharge planning. The patient is planning to be discharged home with home health services

## 2016-11-25 NOTE — Anesthesia Preprocedure Evaluation (Addendum)
Anesthesia Evaluation  Patient identified by MRN, date of birth, ID band Patient awake    Reviewed: Allergy & Precautions, H&P , NPO status , Patient's Chart, lab work & pertinent test results  History of Anesthesia Complications (+) PONV  Airway Mallampati: II  TM Distance: >3 FB Neck ROM: Full    Dental no notable dental hx. (+) Teeth Intact, Dental Advisory Given   Pulmonary neg pulmonary ROS,    Pulmonary exam normal breath sounds clear to auscultation       Cardiovascular negative cardio ROS   Rhythm:Regular Rate:Normal     Neuro/Psych Depression negative neurological ROS  negative psych ROS   GI/Hepatic Neg liver ROS, GERD  Controlled,  Endo/Other  Hypothyroidism   Renal/GU negative Renal ROS  negative genitourinary   Musculoskeletal  (+) Arthritis , Osteoarthritis,    Abdominal   Peds  Hematology negative hematology ROS (+)   Anesthesia Other Findings   Reproductive/Obstetrics negative OB ROS                            Anesthesia Physical Anesthesia Plan  ASA: II  Anesthesia Plan: Spinal   Post-op Pain Management:  Regional for Post-op pain   Induction: Intravenous  Airway Management Planned: Simple Face Mask  Additional Equipment:   Intra-op Plan:   Post-operative Plan:   Informed Consent: I have reviewed the patients History and Physical, chart, labs and discussed the procedure including the risks, benefits and alternatives for the proposed anesthesia with the patient or authorized representative who has indicated his/her understanding and acceptance.   Dental advisory given  Plan Discussed with: CRNA  Anesthesia Plan Comments:         Anesthesia Quick Evaluation

## 2016-11-25 NOTE — Anesthesia Procedure Notes (Signed)
Spinal  Patient location during procedure: OR Start time: 11/25/2016 1:55 PM End time: 11/25/2016 2:00 PM Staffing Anesthesiologist: Roderic Palau Performed: anesthesiologist  Preanesthetic Checklist Completed: patient identified, surgical consent, pre-op evaluation, timeout performed, IV checked, risks and benefits discussed and monitors and equipment checked Spinal Block Patient position: sitting Prep: DuraPrep Patient monitoring: cardiac monitor, continuous pulse ox and blood pressure Approach: midline Location: L3-4 Injection technique: single-shot Needle Needle type: Pencan  Needle gauge: 24 G Needle length: 9 cm Assessment Sensory level: T8 Additional Notes Functioning IV was confirmed and monitors were applied. Sterile prep and drape, including hand hygiene and sterile gloves were used. The patient was positioned and the spine was prepped. The skin was anesthetized with lidocaine.  Free flow of clear CSF was obtained prior to injecting local anesthetic into the CSF.  The spinal needle aspirated freely following injection.  The needle was carefully withdrawn.  The patient tolerated the procedure well.

## 2016-11-25 NOTE — Progress Notes (Signed)
Orthopedic Tech Progress Note Patient Details:  [name] 10-Jun-1941 [id number]  CPM Right Knee CPM Right Knee: On Right Knee Flexion (Degrees): 90 Right Knee Extension (Degrees): 0 Additional Comments: trapeze bar patient helper   Hildred Priest 11/25/2016, 4:40 PM Viewed order from doctor's order list

## 2016-11-25 NOTE — Transfer of Care (Signed)
Immediate Anesthesia Transfer of Care Note  Patient: [name]  Procedure(s) Performed: Procedure(s): RIGHT TOTAL KNEE ARTHROPLASTY (Right)  Patient Location: PACU  Anesthesia Type:MAC combined with regional for post-op pain  Level of Consciousness: awake, alert , oriented and patient cooperative  Airway & Oxygen Therapy: Patient Spontanous Breathing  Post-op Assessment: Report given to RN and Post -op Vital signs reviewed and stable  Post vital signs: Reviewed and stable  Last Vitals:  Vitals:   11/25/16 1136 11/25/16 1609  BP: 136/62   Pulse: (!) 52   Resp: 18   Temp: 36.4 C 36.4 C    Last Pain:  Vitals:   11/25/16 1136  TempSrc: Oral      Patients Stated Pain Goal: 3 (20/72/18 2883)  Complications: No apparent anesthesia complications

## 2016-11-25 NOTE — Anesthesia Procedure Notes (Signed)
Anesthesia Regional Block: Adductor canal block   Pre-Anesthetic Checklist: ,, timeout performed, Correct Patient, Correct Site, Correct Laterality, Correct Procedure, Correct Position, site marked, Risks and benefits discussed, pre-op evaluation,  At surgeon's request and post-op pain management  Laterality: Right  Prep: Maximum Sterile Barrier Precautions used, chloraprep       Needles:  Injection technique: Single-shot  Needle Type: Echogenic Stimulator Needle     Needle Length: 9cm  Needle Gauge: 21     Additional Needles:   Procedures: ultrasound guided,,,,,,,,  Narrative:  Start time: 11/25/2016 12:50 PM End time: 11/25/2016 1:00 PM Injection made incrementally with aspirations every 5 mL. Anesthesiologist: Roderic Palau  Additional Notes: 2% Lidocaine skin wheel.

## 2016-11-25 NOTE — Brief Op Note (Signed)
11/25/2016  3:23 PM  PATIENT:  [name]  76 y.o. female  PRE-OPERATIVE DIAGNOSIS:  right knee osteoarthritis  POST-OPERATIVE DIAGNOSIS:  right knee osteoarthritis  PROCEDURE:  Procedure(s): RIGHT TOTAL KNEE ARTHROPLASTY (Right)  SURGEON:  Surgeon(s) and Role:    * Mcarthur Rossetti, MD - Primary  PHYSICIAN ASSISTANT: Benita Stabile, PA-C  ANESTHESIA:   regional and spinal  EBL:  Total I/O In: 1000 [I.V.:1000] Out: -   COUNTS:  YES  TOURNIQUET:  * Missing tourniquet times found for documented tourniquets in log:  [id number] *  DICTATION: .Other Dictation: Dictation Number [id number]  PLAN OF CARE: Admit to inpatient   PATIENT DISPOSITION:  PACU - hemodynamically stable.   Delay start of Pharmacological VTE agent (>24hrs) due to surgical blood loss or risk of bleeding: no

## 2016-11-26 ENCOUNTER — Encounter (HOSPITAL_COMMUNITY): Payer: Self-pay | Admitting: Orthopaedic Surgery

## 2016-11-26 LAB — CBC
HEMATOCRIT: 36.8 % (ref 36.0–46.0)
HEMOGLOBIN: 12.4 g/dL (ref 12.0–15.0)
MCH: 33.2 pg (ref 26.0–34.0)
MCHC: 33.7 g/dL (ref 30.0–36.0)
MCV: 98.7 fL (ref 78.0–100.0)
Platelets: 121 10*3/uL — ABNORMAL LOW (ref 150–400)
RBC: 3.73 MIL/uL — ABNORMAL LOW (ref 3.87–5.11)
RDW: 13.4 % (ref 11.5–15.5)
WBC: 5.5 10*3/uL (ref 4.0–10.5)

## 2016-11-26 LAB — BASIC METABOLIC PANEL
Anion gap: 5 (ref 5–15)
BUN: 18 mg/dL (ref 6–20)
CHLORIDE: 104 mmol/L (ref 101–111)
CO2: 27 mmol/L (ref 22–32)
CREATININE: 0.77 mg/dL (ref 0.44–1.00)
Calcium: 8.6 mg/dL — ABNORMAL LOW (ref 8.9–10.3)
GFR calc non Af Amer: >60 mL/min (ref >60)
Glucose, Bld: 119 mg/dL — ABNORMAL HIGH (ref 65–99)
Potassium: 4.2 mmol/L (ref 3.5–5.1)
Sodium: 136 mmol/L (ref 135–145)

## 2016-11-26 NOTE — Evaluation (Signed)
Occupational Therapy Evaluation Patient Details Name: [name] MRN: [id number] DOB: 03-30-1941 Today's Date: 11/26/2016    History of Present Illness Pt is a 76 y.o. female s/p R TKA. She has a PMH significant for L thumb trigger thumb, GERD, depression, complication of anesthesia, history of blood clots, history of bronchitis, history of migraine, hypothyroidism, PONV, rheumatic fever, urinary leakage, and vitamin D deficiency.   Clinical Impression   PTA, pt was independent with ADL and functional mobility. Pt currently requires min guard assist for LB ADL and toilet transfers. She plans to have family and friends assist her post-acute D/C at home and educated concerning the initial need for 24 hour supervision for safety and pt verbalizes understanding. Educated pt on fall prevention and knee precautions during ADL. Pt would benefit from continued OT services while admitted to improve independence with ADL and functional mobility. Recommend initial 24 hour assistance with no OT follow-up post-acute D/C. OT will continue to follow acutely with next session focused on shower transfers.    Follow Up Recommendations  No OT follow up;Supervision/Assistance - 24 hour    Equipment Recommendations  None recommended by OT    Recommendations for Other Services       Precautions / Restrictions Precautions Precautions: Knee Precaution Booklet Issued: No Precaution Comments: Reviewed knee precautions during ADL including no pillow under knee. Restrictions Weight Bearing Restrictions: Yes RLE Weight Bearing: Weight bearing as tolerated      Mobility Bed Mobility Overal bed mobility: Needs Assistance Bed Mobility: Supine to Sit     Supine to sit: Supervision     General bed mobility comments: Supervision for safety  Transfers Overall transfer level: Needs assistance Equipment used: Rolling walker (2 wheeled) Transfers: Sit to/from Stand Sit to Stand: Min guard          General transfer comment: Min guard assist to steady.    Balance Overall balance assessment: Needs assistance Sitting-balance support: No upper extremity supported;Feet supported Sitting balance-Leahy Scale: Good     Standing balance support: Bilateral upper extremity supported;No upper extremity supported;During functional activity Standing balance-Leahy Scale: Fair Standing balance comment: Able to statically stand without UE support briefly.                           ADL either performed or assessed with clinical judgement   ADL Overall ADL's : Needs assistance/impaired Eating/Feeding: Set up;Sitting   Grooming: Set up;Sitting   Upper Body Bathing: Supervision/ safety;Standing   Lower Body Bathing: Min guard;Sit to/from stand   Upper Body Dressing : Supervision/safety;Standing   Lower Body Dressing: Min guard;Sit to/from stand   Toilet Transfer: Min guard;Ambulation;RW;BSC   Toileting- Water quality scientist and Hygiene: Min guard;Sit to/from stand       Functional mobility during ADLs: Min guard;Rolling walker General ADL Comments: Educated pt on home modifications for safety, fall prevention, use of ice for pain management, and knee precautions during ADL.     Vision Baseline Vision/History: Wears glasses Wears Glasses: Reading only Vision Assessment?: No apparent visual deficits     Perception     Praxis      Pertinent Vitals/Pain Pain Assessment: Faces Faces Pain Scale: Hurts a little bit Pain Location: R knee Pain Descriptors / Indicators: Operative site guarding Pain Intervention(s): Monitored during session;Repositioned     Hand Dominance Right   Extremity/Trunk Assessment Upper Extremity Assessment Upper Extremity Assessment: Overall WFL for tasks assessed   Lower Extremity Assessment Lower Extremity Assessment: RLE  deficits/detail RLE Deficits / Details: Decreased strength and AROM as expected post-operatively.        Communication Communication Communication: No difficulties   Cognition Arousal/Alertness: Awake/alert Behavior During Therapy: WFL for tasks assessed/performed Overall Cognitive Status: Within Functional Limits for tasks assessed                                     General Comments       Exercises     Shoulder Instructions      Home Living Family/patient expects to be discharged to:: Private residence Living Arrangements: Alone Available Help at Discharge: Family;Friend(s);Available 24 hours/day Type of Home: House Home Access: Ramped entrance     Home Layout: One level     Bathroom Shower/Tub: Occupational psychologist: Handicapped height     Home Equipment: Bedside commode;Shower seat;Grab bars - tub/shower;Hand held Tourist information centre manager - 2 wheels;Cane - single point;Adaptive equipment Adaptive Equipment: Reacher        Prior Functioning/Environment Level of Independence: Independent                 OT Problem List: Decreased strength;Decreased range of motion;Decreased activity tolerance;Impaired balance (sitting and/or standing);Decreased safety awareness;Decreased knowledge of use of DME or AE;Decreased knowledge of precautions;Pain      OT Treatment/Interventions: Self-care/ADL training;Therapeutic exercise;Energy conservation;DME and/or AE instruction;Therapeutic activities;Patient/family education;Balance training    OT Goals(Current goals can be found in the care plan section) Acute Rehab OT Goals Patient Stated Goal: to go home OT Goal Formulation: With patient Time For Goal Achievement: 12/10/16 Potential to Achieve Goals: Good ADL Goals Pt Will Perform Grooming: with modified independence;standing Pt Will Perform Lower Body Bathing: with modified independence;sit to/from stand Pt Will Perform Lower Body Dressing: with modified independence;sit to/from stand Pt Will Transfer to Toilet: with modified  independence;ambulating;bedside commode (BSC over toilet) Pt Will Perform Toileting - Clothing Manipulation and hygiene: with modified independence;sit to/from stand Pt Will Perform Tub/Shower Transfer: with modified independence;Shower transfer;shower seat;rolling walker  OT Frequency: Min 2X/week   Barriers to D/C:            Co-evaluation              End of Session Equipment Utilized During Treatment: Gait belt;Rolling walker Nurse Communication: Mobility status (PB 99/48)  Activity Tolerance: Patient tolerated treatment well Patient left: in chair;with call bell/phone within reach;with family/visitor present  OT Visit Diagnosis: Other abnormalities of gait and mobility (R26.89);Pain Pain - Right/Left: Right Pain - part of body: Knee                Time: 2563-8937 OT Time Calculation (min): 27 min Charges:  OT General Charges $OT Visit: 1 Procedure OT Evaluation $OT Eval Moderate Complexity: 1 Procedure OT Treatments $Self Care/Home Management : 8-22 mins G-Codes:     Norman Herrlich, MS OTR/L  Pager: [place] A [name] 11/26/2016, 1:59 PM

## 2016-11-26 NOTE — Progress Notes (Signed)
Subjective: 1 Day Post-Op Procedure(s) (LRB): RIGHT TOTAL KNEE ARTHROPLASTY (Right) Patient reports pain as moderate.  In CPM now.  Labs not back yet.  Objective: Vital signs in last 24 hours: Temp:  [97 F (36.1 C)-97.9 F (36.6 C)] 97.8 F (36.6 C) (04/25 0527) Pulse Rate:  [50-70] 70 (04/25 0527) Resp:  [10-19] 16 (04/25 0527) BP: (95-146)/(54-93) 95/54 (04/25 0527) SpO2:  [92 %-100 %] 100 % (04/25 0527) Weight:  [178 lb 9.6 oz (81 kg)] 178 lb 9.6 oz (81 kg) (04/24 1136)  Intake/Output from previous day: 04/24 0701 - 04/25 0700 In: 2358.8 [P.O.:150; I.V.:2208.8] Out: 50 [Blood:50] Intake/Output this shift: No intake/output data recorded.  No results for input(s): HGB in the last 72 hours. No results for input(s): WBC, RBC, HCT, PLT in the last 72 hours. No results for input(s): NA, K, CL, CO2, BUN, CREATININE, GLUCOSE, CALCIUM in the last 72 hours. No results for input(s): LABPT, INR in the last 72 hours.  Sensation intact distally Intact pulses distally Dorsiflexion/Plantar flexion intact Incision: dressing C/D/I Compartment soft  Assessment/Plan: 1 Day Post-Op Procedure(s) (LRB): RIGHT TOTAL KNEE ARTHROPLASTY (Right) Up with therapy  Mcarthur Rossetti 11/26/2016, 7:03 AM

## 2016-11-26 NOTE — Progress Notes (Signed)
PT Cancellation Note  Patient Details Name: DORRINE MONTONE MRN: [id number] DOB: [birth year]   Cancelled Treatment:     Pt reports significant pain and just returned to bed and was positioned in CPM.  PT to check back after lunch once pain meds are given. Barbarann Ehlers [place], [place], DPT [phone]   11/26/2016, 10:52 PM

## 2016-11-26 NOTE — Progress Notes (Signed)
11/26/16 1810  PT Visit Information  Last PT Received On 11/26/16  Assistance Needed +1  History of Present Illness Pt is a 76 y.o. female s/p R TKA on 11/25/16. She has a PMH significant for L thumb trigger thumb, GERD, depression, complication of anesthesia, history of blood clots, history of bronchitis, history of migraine, hypothyroidism, PONV, rheumatic fever, urinary leakage, and vitamin D deficiency.  Subjective Data  Patient Stated Goal to go home  Precautions  Precautions Knee  Precaution Booklet Issued Yes (comment)  Precaution Comments knee exercise handout given and reviewed, no pillow under surgical knee reviewed.    Restrictions  Weight Bearing Restrictions Yes  RLE Weight Bearing WBAT  Pain Assessment  Pain Assessment 0-10  Pain Score 8  Pain Location R knee  Pain Descriptors / Indicators Operative site guarding;Burning;Aching  Pain Intervention(s) Limited activity within patient's tolerance;Monitored during session;Repositioned;Ice applied  Cognition  Arousal/Alertness Awake/alert  Behavior During Therapy WFL for tasks assessed/performed  Overall Cognitive Status Within Functional Limits for tasks assessed  Bed Mobility  Overal bed mobility Needs Assistance  Bed Mobility Supine to Sit;Sit to Supine  Supine to sit Modified independent (Device/Increase time)  Sit to supine Modified independent (Device/Increase time)  General bed mobility comments Pt easily got EOB with a light hand on the bed rail.  She was able to lift her leg back into the bed to get back to supine.   Transfers  Overall transfer level Needs assistance  Equipment used Rolling walker (2 wheeled)  Transfers Sit to/from Stand  Sit to Stand Supervision  General transfer comment Supervision for safety. Safe technique  Ambulation/Gait  Ambulation/Gait assistance Supervision  Ambulation Distance (Feet) 120 Feet  Assistive device Rolling walker (2 wheeled)  Gait Pattern/deviations Step-through  pattern;Antalgic  General Gait Details Pt with moderately antalgic, flexed knee gait pattern, pt demonstrated safe RW use  Gait velocity decreased  Gait velocity interpretation Below normal speed for age/gender  Balance  Overall balance assessment Needs assistance  Sitting-balance support No upper extremity supported;Feet supported  Sitting balance-Leahy Scale Good  Standing balance support Bilateral upper extremity supported;No upper extremity supported;During functional activity  Standing balance-Leahy Scale Fair  Standing balance comment static standing with supervision  Exercises  Exercises Total Joint  Total Joint Exercises  Short Arc Quad AROM;Right;10 reps  Hip ABduction/ADduction AROM;Right;10 reps  Straight Leg Raises AROM;Right;10 reps  PT - End of Session  Activity Tolerance Patient limited by pain  Patient left with call bell/phone within reach;in bed;in CPM  PT - Assessment/Plan  PT Plan Current plan remains appropriate  PT Visit Diagnosis Muscle weakness (generalized) (M62.81);Difficulty in walking, not elsewhere classified (R26.2);Pain  Pain - Right/Left Right  Pain - part of body Knee  PT Frequency (ACUTE ONLY) 7X/week  Follow Up Recommendations Supervision - Intermittent;Home health PT  PT equipment None recommended by PT  AM-PAC PT "6 Clicks" Daily Activity Outcome Measure  Difficulty turning over in bed (including adjusting bedclothes, sheets and blankets)? 3  Difficulty moving from lying on back to sitting on the side of the bed?  3  Difficulty sitting down on and standing up from a chair with arms (e.g., wheelchair, bedside commode, etc,.)? 3  Help needed moving to and from a bed to chair (including a wheelchair)? 4  Help needed walking in hospital room? 4  Help needed climbing 3-5 steps with a railing?  3  6 Click Score 20  Mobility G Code  CJ  PT Time Calculation  PT Start Time (ACUTE  ONLY) 1748  PT Stop Time (ACUTE ONLY) 1810  PT Time Calculation (min)  (ACUTE ONLY) 22 min  PT General Charges  $$ ACUTE PT VISIT 1 Procedure  PT Treatments  $Gait Training 8-22 mins  11/26/2016 Pt is POD #1 and this is her second session.  She is progressing well with gait and mobility and has no STE her home, so she will not need to do stair training.  We progressed her HEP and she needs to review the last 3 exercises in the HEP packed before d/c tomorrow. ROM 8-75 degrees of AAROM measured in supine.   Barbarann Ehlers [place], [place], DPT [phone]

## 2016-11-26 NOTE — Evaluation (Signed)
Physical Therapy Evaluation Patient Details Name: [name] MRN: [id number] DOB: 07/04/1941 Today's Date: 11/26/2016   History of Present Illness  Pt is a 76 y.o. female s/p R TKA on 11/25/16. She has a PMH significant for L thumb trigger thumb, GERD, depression, complication of anesthesia, history of blood clots, history of bronchitis, history of migraine, hypothyroidism, PONV, rheumatic fever, urinary leakage, and vitamin D deficiency.  Clinical Impression  Pt is POD #1 and is moving well, min guard assist overall with RW and was able to walk a good distance down the hallway.  HEP and knee precaution education initiated.  She has no STE, so stair training will not be needed.   PT to follow acutely for deficits listed below.       Follow Up Recommendations Supervision - Intermittent;Home health PT    Equipment Recommendations  None recommended by PT    Recommendations for Other Services   NA    Precautions / Restrictions Precautions Precautions: Knee Precaution Booklet Issued: Yes (comment) Precaution Comments: knee exercise handout given and reviewed, no pillow under surgical knee reviewed.   Restrictions Weight Bearing Restrictions: Yes RLE Weight Bearing: Weight bearing as tolerated      Mobility  Bed Mobility Overal bed mobility: Needs Assistance Bed Mobility: Supine to Sit     Supine to sit: Supervision     General bed mobility comments: Supervision for safety, HOB elevated, using railing for leverage  Transfers Overall transfer level: Needs assistance Equipment used: Rolling walker (2 wheeled) Transfers: Sit to/from Stand Sit to Stand: Min guard         General transfer comment: Min guard assist for safety, verbal cues for safe hand placement.   Ambulation/Gait Ambulation/Gait assistance: Supervision Ambulation Distance (Feet): 130 Feet Assistive device: Rolling walker (2 wheeled) Gait Pattern/deviations: Step-through pattern;Antalgic Gait  velocity: decreased Gait velocity interpretation: Below normal speed for age/gender General Gait Details: Pt with moderately antalgic, flexed knee gait pattern, verbal cues for safe RW use.                   Balance Overall balance assessment: Needs assistance Sitting-balance support: No upper extremity supported;Feet supported Sitting balance-Leahy Scale: Good     Standing balance support: Bilateral upper extremity supported;No upper extremity supported;During functional activity Standing balance-Leahy Scale: Fair Standing balance comment: static standing with supervision                             Pertinent Vitals/Pain Pain Assessment: 0-10 Pain Score: 6  Pain Location: R knee Pain Descriptors / Indicators: Operative site guarding;Burning;Aching Pain Intervention(s): Limited activity within patient's tolerance;Monitored during session;Repositioned    Home Living Family/patient expects to be discharged to:: Private residence Living Arrangements: Alone Available Help at Discharge: Family;Friend(s);Available 24 hours/day Type of Home: House Home Access: Ramped entrance     Home Layout: One level Home Equipment: Bedside commode;Shower seat;Grab bars - tub/shower;Hand held Tourist information centre manager - 2 wheels;Cane - single point;Adaptive equipment      Prior Function Level of Independence: Independent               Hand Dominance   Dominant Hand: Right    Extremity/Trunk Assessment   Upper Extremity Assessment Upper Extremity Assessment: Defer to OT evaluation    Lower Extremity Assessment Lower Extremity Assessment: RLE deficits/detail RLE Deficits / Details: right leg with normal post op pain and weakness.  Ankle at least 3/5, knee 2/5, hip flexion 3-/5  RLE Sensation:  (intact to LT)    Cervical / Trunk Assessment Cervical / Trunk Assessment: Normal  Communication   Communication: No difficulties  Cognition Arousal/Alertness:  Awake/alert Behavior During Therapy: WFL for tasks assessed/performed Overall Cognitive Status: Within Functional Limits for tasks assessed                                           Exercises Total Joint Exercises Ankle Circles/Pumps: AROM;Both;20 reps Quad Sets: AROM;Right;10 reps Towel Squeeze: AROM;Both;10 reps Heel Slides: AAROM;Right;10 reps   Assessment/Plan    PT Assessment Patient needs continued PT services  PT Problem List Decreased strength;Decreased range of motion;Decreased activity tolerance;Decreased balance;Decreased mobility;Decreased knowledge of use of DME;Pain;Decreased knowledge of precautions       PT Treatment Interventions DME instruction;Gait training;Functional mobility training;Therapeutic activities;Therapeutic exercise;Balance training;Patient/family education;Manual techniques;Modalities    PT Goals (Current goals can be found in the Care Plan section)  Acute Rehab PT Goals Patient Stated Goal: to go home PT Goal Formulation: With patient Time For Goal Achievement: 12/03/16 Potential to Achieve Goals: Good    Frequency 7X/week           End of Session   Activity Tolerance: Patient limited by pain Patient left: in chair;with call bell/phone within reach   PT Visit Diagnosis: Muscle weakness (generalized) (M62.81);Difficulty in walking, not elsewhere classified (R26.2);Pain Pain - Right/Left: Right Pain - part of body: Knee    Time: 1400-1434 PT Time Calculation (min) (ACUTE ONLY): 34 min   Charges:   PT Evaluation $PT Eval Moderate Complexity: 1 Procedure PT Treatments $Gait Training: 8-22 mins   Armelia Penton B. [place], [place], DPT [phone]   11/26/2016, 10:50 PM

## 2016-11-26 NOTE — Anesthesia Postprocedure Evaluation (Signed)
Anesthesia Post Note  Patient: [name]  Procedure(s) Performed: Procedure(s) (LRB): RIGHT TOTAL KNEE ARTHROPLASTY (Right)  Patient location during evaluation: PACU Anesthesia Type: Spinal and Regional Level of consciousness: awake and alert Pain management: pain level controlled Vital Signs Assessment: post-procedure vital signs reviewed and stable Respiratory status: spontaneous breathing and respiratory function stable Cardiovascular status: blood pressure returned to baseline and stable Postop Assessment: spinal receding Anesthetic complications: no       Last Vitals:  Vitals:   11/26/16 1404 11/26/16 1600  BP: (!) 92/42 116/60  Pulse: 63   Resp: 18   Temp: 36.9 C     Last Pain:  Vitals:   11/26/16 1620  TempSrc:   PainSc: 4                  Kristofer Schaffert,W. EDMOND

## 2016-11-26 NOTE — Op Note (Signed)
NAME:  [name]                   ACCOUNT NO.:  MEDICAL RECORD NO.:  [id number]  LOCATION:                                 FACILITY:  PHYSICIAN:  Lind Guest. Ninfa Linden, M.D.DATE OF BIRTH:  DATE OF PROCEDURE:  11/25/2016 DATE OF DISCHARGE:                              OPERATIVE REPORT   PREOPERATIVE DIAGNOSIS:  Primary osteoarthritis and degenerative joint disease of right knee.  POSTOPERATIVE DIAGNOSIS:  Primary osteoarthritis and degenerative joint disease of right knee.  PROCEDURE:  Right total knee arthroplasty.  IMPLANTS:  Stryker Triathlon knee with size 3 femur, size 4 tibial tray, 11 mm fix-bearing polyethylene insert, size 32 patellar button.  SURGEON:  Lind Guest. Ninfa Linden, M.D.  ANESTHESIA: 1. Right lower extremity regional adductor canal block. 2. Spinal.  ANTIBIOTICS:  2 g of IV Ancef.  BLOOD LOSS:  Less than 200 mL.  TOURNIQUET TIME:  Less than 1 hour.  COMPLICATIONS:  None.  INDICATIONS:  [name] is a 76 year old female with debilitating arthritis involving her right knee.  This has been quite painful to her. It had detrimentally affected her activities of daily living, her quality of life, and her mobility.  Her x-rays confirmed severe end- stage arthritis as well.  She has tried and failed all forms of conservative treatment.  At this point, gives informed consent to proceed with a total knee arthroplasty on her right knee.  She has had one done on her left knee by [place] several years ago.  She understands the risk of acute blood loss anemia, nerve and vessel injury, fracture, infection, and DVT.  She understands that our goals are to decrease pain, improve mobility, and overall improve quality of life.  PROCEDURE DESCRIPTION:  After informed consent was obtained, appropriate right knee was marked.  She was brought to the operating room.  After an adductor canal block was obtained by Anesthesia in holding; in the operating room, spinal  anesthesia was obtained.  She was then laid in supine position on the operating table.  A nonsterile tourniquet was placed around her upper right thigh and her right leg was prepped and draped from the thigh down the toes with DuraPrep and sterile drapes.  A time-out was called and she was identified as correct patient and correct right knee.  We then used an Esmarch to wrap out the leg and tourniquet was inflated to 300 mmHg.  We then made a direct midline incision over the knee and carried this over the patella and carried this proximally and distally.  We dissected down the knee joint and carried out our medial parapatellar arthrotomy.  We found a significant joint effusion and severe periarticular osteophytes and loose bodies throughout the knee and basically, the knee was devoid of cartilage.  We removed remnants of the medial and lateral meniscus and ACL and PCL. With the knee in a flexed position, we used extramedullary cutting guide for the tibia to start with our tibia cut.  We corrected varus and valgus and then a neutral slope and set our cut to take 9 mm off the high side.  We made this cut without difficulty.  We then used an intramedullary  drill and guide for our distal femoral cut.  We set this for 10 mm distal femoral cut since she has a slight flexion contracture. We made our distal femoral cut based on the cutting guide at 5 degrees externally rotated for right knee and made this cut without difficulty. With the knee in full extension, the 11 mm extension block gave her full extension.  We then went back to the femur and put our sizing guide based off the epicondylar axis and chose a size 3 femur.  We made our 4- in-1 cutting block for a size 3 femur and then our femoral box cut.  We then back to the tibia and we chose a size 4 tibia, closed the coverage of the tibial plateau, setting our rotation off the femur and the tibial tubercle.  We made our tibia keel cut and  drilled a hole for universal base plate.  With the trial 4 tibia in place followed by the trial 3 femur, we trialed 11 mm fix-bearing polyethylene insert and we were pleased with range of motion and stability.  We then made our patellar cut and drilled 3 holes for a size 32 patellar button.  We then removed all components and irrigated the knee with normal saline solution.  We mixed our cement and then cemented the real Stryker Triathlon universal base plate, tibia tray size 4 followed by the real size 3 femur.  We cleaned cement debris from the knee and placed the real 11 mm fix- bearing polyethylene insert.  We then cemented our patellar button. Once the cement had hardened, we let the tourniquet down.  Hemostasis was obtained with electrocautery.  We then irrigated the knee one more time with normal saline solution using pulsatile lavage.  We then closed the arthrotomy with interrupted #1 Vicryl suture followed by 0 Vicryl in the deep tissue, 2-0 Vicryl in the subcutaneous tissue, 4-0 Monocryl subcuticular stitch and Steri-Strips on the skin.  Well-padded sterile dressing was applied.  She was taken off the operating table and taken to the recovery room in stable condition.  All final counts were correct.  There were no complications noted.  Of note, Erskine Emery, PA- C, assisted in entire case.  His assistance was crucial for facilitating all aspects of this case from beginning to end.     Lind Guest. Ninfa Linden, M.D.     CYB/MEDQ  D:  11/25/2016  T:  11/26/2016  Job:  [id number]

## 2016-11-27 ENCOUNTER — Encounter (HOSPITAL_COMMUNITY): Payer: Self-pay | Admitting: General Practice

## 2016-11-27 MED ORDER — OXYCODONE-ACETAMINOPHEN 5-325 MG PO TABS: 1.0000 | ORAL_TABLET | ORAL | 0 refills | Status: DC | PRN

## 2016-11-27 MED ORDER — ASPIRIN 325 MG PO TBEC: 325.0000 mg | DELAYED_RELEASE_TABLET | Freq: Two times a day (BID) | ORAL | 0 refills | Status: DC

## 2016-11-27 NOTE — Discharge Instructions (Signed)

## 2016-11-27 NOTE — Progress Notes (Signed)
Physical Therapy Treatment Patient Details Name: [name] MRN: [id number] DOB: [birth year] Today's Date: 11/27/2016    History of Present Illness Pt is a 76 y.o. female s/p R TKA on 11/25/16. She has a PMH significant for L thumb trigger thumb, GERD, depression, complication of anesthesia, history of blood clots, history of bronchitis, history of migraine, hypothyroidism, PONV, rheumatic fever, urinary leakage, and vitamin D deficiency.    PT Comments    Patient in CPM on arrival. Stated she had been using the CPM for 3 hours. Instructed pt on final 3 exercises in HEP and assisted pt with toileting and brushing teeth. Pt able to perform ADLs at sink standing inside walker without UE support. Pt at supervision level with ADLs. Pt expected to d/c today. Patient should benefit from continued skilled PT. Will continue to follow acutely if still here.    Follow Up Recommendations  Supervision - Intermittent;Home health PT     Equipment Recommendations  None recommended by PT    Recommendations for Other Services       Precautions / Restrictions Precautions Precautions: Knee Precaution Booklet Issued: Yes (comment) Precaution Comments: knee exercise handout reviewed, no pillow under surgical knee reviewed.   Restrictions Weight Bearing Restrictions: Yes RLE Weight Bearing: Weight bearing as tolerated    Mobility  Bed Mobility Overal bed mobility: Needs Assistance Bed Mobility: Supine to Sit;Sit to Supine     Supine to sit: Modified independent (Device/Increase time)     General bed mobility comments: Pt easily got EOB with a light hand on the bed rail.  Transfers Overall transfer level: Needs assistance Equipment used: Rolling walker (2 wheeled) Transfers: Sit to/from Stand Sit to Stand: Supervision         General transfer comment: Supervision for safety. Safe technique  Ambulation/Gait Ambulation/Gait assistance: Supervision Ambulation Distance (Feet): 16  Feet (to restroom and back to chair) Assistive device: Rolling walker (2 wheeled) Gait Pattern/deviations: Step-through pattern;Antalgic Gait velocity: decreased   General Gait Details: Pt with moderately antalgic, flexed knee gait pattern, pt demonstrated safe RW use   Stairs            Wheelchair Mobility    Modified Rankin (Stroke Patients Only)       Balance Overall balance assessment: Needs assistance Sitting-balance support: No upper extremity supported;Feet supported Sitting balance-Leahy Scale: Good     Standing balance support: Bilateral upper extremity supported;No upper extremity supported;During functional activity Standing balance-Leahy Scale: Fair Standing balance comment: Able to perform ADLs (brushing teath, washing hands, toileting) with contact guard and no UE support                            Cognition Arousal/Alertness: Awake/alert Behavior During Therapy: WFL for tasks assessed/performed Overall Cognitive Status: Within Functional Limits for tasks assessed                                        Exercises Total Joint Exercises Heel Slides: AROM;Strengthening;Right;10 reps;Seated Long Arc Quad: AROM;Strengthening;Right;10 reps;Seated Knee Flexion: AROM;Strengthening;Right;10 reps;Seated    General Comments        Pertinent Vitals/Pain Pain Assessment: 0-10 Pain Score: 6  (with ambulation) Pain Location: R knee Pain Descriptors / Indicators: Operative site guarding;Burning;Aching    Home Living  Prior Function            PT Goals (current goals can now be found in the care plan section) Acute Rehab PT Goals Patient Stated Goal: to go home PT Goal Formulation: With patient Time For Goal Achievement: 12/03/16 Potential to Achieve Goals: Good Progress towards PT goals: Progressing toward goals    Frequency    7X/week      PT Plan Current plan remains appropriate     Co-evaluation             End of Session Equipment Utilized During Treatment: Gait belt Activity Tolerance: Patient tolerated treatment well Patient left: in chair;with call bell/phone within reach   PT Visit Diagnosis: Muscle weakness (generalized) (M62.81);Difficulty in walking, not elsewhere classified (R26.2);Pain Pain - Right/Left: Right Pain - part of body: Knee     Time: 6283-6629 PT Time Calculation (min) (ACUTE ONLY): 31 min  Charges:  $Therapeutic Exercise: 8-22 mins $Therapeutic Activity: 8-22 mins                    G Codes:       Benjiman Core, PTA Acute Rehab [place] 11/27/2016, 10:32 AM

## 2016-11-27 NOTE — Progress Notes (Signed)
Subjective: 2 Days Post-Op Procedure(s) (LRB): RIGHT TOTAL KNEE ARTHROPLASTY (Right) Patient reports pain as moderate.    Objective: Vital signs in last 24 hours: Temp:  [98 F (36.7 C)-98.9 F (37.2 C)] 98 F (36.7 C) (04/26 0615) Pulse Rate:  [61-70] 70 (04/26 0615) Resp:  [18] 18 (04/26 0615) BP: (92-120)/(42-63) 118/63 (04/26 0615) SpO2:  [92 %-97 %] 97 % (04/26 0615)  Intake/Output from previous day: 04/25 0701 - 04/26 0700 In: 720 [P.O.:720] Out: -  Intake/Output this shift: No intake/output data recorded.   Recent Labs  11/26/16 0747  HGB 12.4    Recent Labs  11/26/16 0747  WBC 5.5  RBC 3.73*  HCT 36.8  PLT 121*    Recent Labs  11/26/16 0747  NA 136  K 4.2  CL 104  CO2 27  BUN 18  CREATININE 0.77  GLUCOSE 119*  CALCIUM 8.6*   No results for input(s): LABPT, INR in the last 72 hours.  Sensation intact distally Intact pulses distally Dorsiflexion/Plantar flexion intact Incision: dressing C/D/I No cellulitis present Compartment soft  Assessment/Plan: 2 Days Post-Op Procedure(s) (LRB): RIGHT TOTAL KNEE ARTHROPLASTY (Right) Up with therapy Discharge home with home health this afternoon.  Mcarthur Rossetti 11/27/2016, 11:18 AM

## 2016-11-27 NOTE — Care Management Note (Signed)
Case Management Note  Patient Details  Name: [name] MRN: [id number] Date of Birth: 01-Mar-1941  Subjective/Objective:   76 yr old female s/p right total knee arthroplasty.                 Action/Plan:  Patient was preoperatively setup with [facility], no changes. She will have support at discharge. Patient states she has RW and 3in1 at home.    Expected Discharge Date:  11/27/16               Expected Discharge Plan:  Gravity  In-House Referral:     Discharge planning Services  CM Consult  Post Acute Care Choice:  Home Health, Durable Medical Equipment Choice offered to:  Patient  DME Arranged: patient has RW and 3in1 DME Agency:    HH Arranged:  [employer]:  [facility] (formerly [employer])  Status of Service:  Completed, signed off  If discussed at [employer], dates discussed:    Additional Comments:  Ninfa Meeker, RN 11/27/2016, 12:50 PM

## 2016-11-27 NOTE — Progress Notes (Signed)
Discharge instructions and prescriptions provided to patient.  IV removed.  Patient states she has all equipment at home, confirmed with case management.  No questions at time of discharge.  Escorted via wheelchair at time of discharge by volunteer.

## 2016-11-27 NOTE — Progress Notes (Signed)
Occupational Therapy Treatment Patient Details Name: [name] MRN: [id number] DOB: Jun 12, 1941 Today's Date: 11/27/2016    History of present illness Pt is a 76 y.o. female s/p R TKA on 11/25/16. She has a PMH significant for L thumb trigger thumb, GERD, depression, complication of anesthesia, history of blood clots, history of bronchitis, history of migraine, hypothyroidism, PONV, rheumatic fever, urinary leakage, and vitamin D deficiency.   OT comments  Completed all education regarding compensatory techniques for LB ADL, home safety, reducing risk of falls, and safe walk in shower transfer techniques. Pt safe to DC home from OT standpoint. Pt will have adequate level of S.   Follow Up Recommendations  No OT follow up;Supervision/Assistance - 24 hour    Equipment Recommendations  None recommended by OT    Recommendations for Other Services      Precautions / Restrictions Precautions Precautions: Knee Precaution Booklet Issued: Yes (comment) Precaution Comments: knee exercise handout reviewed, no pillow under surgical knee reviewed.   Restrictions Weight Bearing Restrictions: Yes RLE Weight Bearing: Weight bearing as tolerated       Mobility Bed Mobility Overal bed mobility: Needs Assistance Bed Mobility: Supine to Sit;Sit to Supine     Supine to sit: Modified independent (Device/Increase time)     General bed mobility comments: OOB in chair  Transfers Overall transfer level: Needs assistance Equipment used: Rolling walker (2 wheeled) Transfers: Sit to/from Stand Sit to Stand: Supervision         General transfer comment: Supervision for safety. Safe technique    Balance Overall balance assessment: Needs assistance Sitting-balance support: No upper extremity supported;Feet supported Sitting balance-Leahy Scale: Good     Standing balance support: Bilateral upper extremity supported;No upper extremity supported;During functional activity Standing  balance-Leahy Scale: Fair Standing balance comment: Able to perform ADLs (brushing teath, washing hands, toileting) with contact guard and no UE support                           ADL either performed or assessed with clinical judgement   ADL                                         General ADL Comments: Completed education regarding compensatory techniques for ADL, home safety and reducing risk of falls. Educated pt on safe walk in shower transfer techniqeus. Pt reqruied mod vc for technique. Recommended pt sponge bath at this time and only transfer into shower when she has help available. Pt verbalized understanding.      Vision       Perception     Praxis      Cognition Arousal/Alertness: Awake/alert Behavior During Therapy: WFL for tasks assessed/performed Overall Cognitive Status: Within Functional Limits for tasks assessed                                          Exercises Total Joint Exercises Heel Slides: AROM;Strengthening;Right;10 reps;Seated Long Arc Quad: AROM;Strengthening;Right;10 reps;Seated Knee Flexion: AROM;Strengthening;Right;10 reps;Seated   Shoulder Instructions       General Comments      Pertinent Vitals/ Pain       Pain Assessment: 0-10 Pain Score: 3  Pain Location: R knee Pain Descriptors / Indicators: Operative site guarding;Burning;Aching Pain Intervention(s): Limited activity  within patient's tolerance;Ice applied;Repositioned  Home Living Family/patient expects to be discharged to:: Private residence Living Arrangements: Alone                                      Prior Functioning/Environment              Frequency  Min 2X/week        Progress Toward Goals  OT Goals(current goals can now be found in the care plan section)  Progress towards OT goals: Progressing toward goals  Acute Rehab OT Goals Patient Stated Goal: to go home OT Goal Formulation: With  patient Time For Goal Achievement: 12/10/16 Potential to Achieve Goals: Good ADL Goals Pt Will Perform Grooming: with modified independence;standing Pt Will Perform Lower Body Bathing: with modified independence;sit to/from stand Pt Will Perform Lower Body Dressing: with modified independence;sit to/from stand Pt Will Transfer to Toilet: with modified independence;ambulating;bedside commode Pt Will Perform Toileting - Clothing Manipulation and hygiene: with modified independence;sit to/from stand Pt Will Perform Tub/Shower Transfer: with modified independence;Shower transfer;shower seat;rolling walker  Plan Discharge plan remains appropriate    Co-evaluation                 End of Session CPM Right Knee CPM Right Knee: Off  OT Visit Diagnosis: Other abnormalities of gait and mobility (R26.89);Pain Pain - Right/Left: Right Pain - part of body: Knee   Activity Tolerance     Patient Left     Nurse Communication          Time: 7169-6789 OT Time Calculation (min): 20 min  Charges: OT General Charges $OT Visit: 1 Procedure OT Treatments $Self Care/Home Management : 8-22 mins  [facility], OT/L  [phone] 11/27/2016   [name],[name] 11/27/2016, 1:40 PM

## 2016-11-27 NOTE — Discharge Summary (Signed)
[name] MRN: [id number] DOB/AGE: 04-28-1941 76 y.o.  Admit date: 11/25/2016 Discharge date: 11/27/2016  Admission Diagnoses:  Principal Problem:   Unilateral primary osteoarthritis, right knee Active Problems:   Status post total right knee replacement   Discharge Diagnoses:  Same  Past Medical History:  Diagnosis Date  . Allergy    takes Claritin daily  . Arthritis   . Complication of anesthesia   . Constipation    takes Senokot daily  . Depression    takes Lexapro daily  . Family history of adverse reaction to anesthesia    sister has a hard time waking up  . GERD (gastroesophageal reflux disease)   . History of blood clots 1979   after C/S clot went to lung  . History of bronchitis    3 yrs ago  . History of migraine   . Hypothyroidism    takes Synthroid daily  . Joint pain   . Joint swelling   . Personal history of colonic polyps    benign  . PONV (postoperative nausea and vomiting)   . Rheumatic fever 1958   hospitalized for three months  . Urinary leakage    takes Ditropan daily  . Vitamin D deficiency    takes Vit D daily    Surgeries: Procedure(s): RIGHT TOTAL KNEE ARTHROPLASTY on 11/25/2016   Consultants:   Discharged Condition: Improved  Hospital Course: LITITIA SEN is an 76 y.o. female who was admitted 11/25/2016 for operative treatment ofUnilateral primary osteoarthritis, right knee. Patient has severe unremitting pain that affects sleep, daily activities, and work/hobbies. After pre-op clearance the patient was taken to the operating room on 11/25/2016 and underwent  Procedure(s): RIGHT TOTAL KNEE ARTHROPLASTY.    Patient was given perioperative antibiotics: Anti-infectives    Start     Dose/Rate Route Frequency Ordered Stop   11/25/16 2000  ceFAZolin (ANCEF) IVPB 1 g/50 mL premix     1 g 100 mL/hr over 30 Minutes Intravenous Every 6 hours 11/25/16 1810 11/26/16 0230   11/25/16 1400  ceFAZolin (ANCEF) IVPB 2g/100 mL  premix     2 g 200 mL/hr over 30 Minutes Intravenous To [facility] 11/24/16 0824 11/25/16 1359       Patient was given sequential compression devices, early ambulation, and chemoprophylaxis to prevent DVT.  Patient benefited maximally from hospital stay and there were no complications.    Recent vital signs: Patient Vitals for the past 24 hrs:  BP Temp Temp src Pulse Resp SpO2  11/27/16 0615 118/63 98 F (36.7 C) Oral 70 18 97 %  11/26/16 1939 (!) 120/55 98.9 F (37.2 C) Oral 61 18 97 %  11/26/16 1600 116/60 - - - - -  11/26/16 1404 (!) 92/42 98.4 F (36.9 C) Oral 63 18 92 %     Recent laboratory studies:  Recent Labs  11/26/16 0747  WBC 5.5  HGB 12.4  HCT 36.8  PLT 121*  NA 136  K 4.2  CL 104  CO2 27  BUN 18  CREATININE 0.77  GLUCOSE 119*  CALCIUM 8.6*     Discharge Medications:   Allergies as of 11/27/2016      Reactions   No Known Allergies       Medication List    TAKE these medications   acetaminophen 325 MG tablet Commonly known as:  TYLENOL Take 325 mg by mouth every 6 (six) hours as needed for mild pain or headache.   amoxicillin 500 MG capsule  Commonly known as:  AMOXIL Take 2,000 mg by mouth once.   aspirin 325 MG EC tablet Take 1 tablet (325 mg total) by mouth 2 (two) times daily after a meal.   Calcium 600-400 MG-UNIT Chew Chew 1 tablet by mouth 3 (three) times daily.   cholecalciferol 1000 units tablet Commonly known as:  VITAMIN D Take 1,000 Units by mouth daily.   escitalopram 20 MG tablet Commonly known as:  LEXAPRO Take 20 mg by mouth daily.   FIBERCON PO Take 1 tablet by mouth at bedtime.   levothyroxine 100 MCG tablet Commonly known as:  SYNTHROID, LEVOTHROID Take 100 mcg by mouth every evening.   loratadine 10 MG tablet Commonly known as:  CLARITIN Take 10 mg by mouth daily.   multivitamin with minerals Tabs tablet Take 1 tablet by mouth 2 (two) times daily.   oxybutynin 5 MG tablet Commonly known as:   DITROPAN Take 5 mg by mouth every evening.   oxyCODONE-acetaminophen 5-325 MG tablet Commonly known as:  ROXICET Take 1-2 tablets by mouth every 4 (four) hours as needed.   sennosides-docusate sodium 8.6-50 MG tablet Commonly known as:  SENOKOT-S Take 2 tablets by mouth at bedtime.   Vitamin B-12 2500 MCG Subl Place 2,500 mcg under the tongue daily.   Vitamin C 500 MG Chew Chew 500 mg by mouth 2 (two) times daily.            Durable Medical Equipment        Start     Ordered   11/25/16 1810  DME 3 n 1  Once     11/25/16 1810   11/25/16 1810  DME Walker rolling  Once    Question:  Patient needs a walker to treat with the following condition  Answer:  Status post total right knee replacement   11/25/16 1810      Diagnostic Studies: Dg Knee Right Port  Result Date: 11/25/2016 CLINICAL DATA:  Total right knee replacement EXAM: PORTABLE RIGHT KNEE - 1-2 VIEW COMPARISON:  None. FINDINGS: Changes of right knee replacement. No hardware or bony complicating feature. IMPRESSION: Right knee replacement.  No complicating feature. Electronically Signed   By: Rolm Baptise M.D.   On: 11/25/2016 16:34    Disposition: 01-Home or Self Care  Discharge Instructions    Discharge patient    Complete by:  As directed    Discharge disposition:  01-Home or Self Care   Discharge patient date:  11/27/2016      Follow-up Information    Mcarthur Rossetti, MD Follow up in 2 week(s).   Specialty:  Orthopedic Surgery Contact information: [place] [address] [phone]            Signed: Mcarthur Rossetti 11/27/2016, 11:20 AM

## 2016-11-28 ENCOUNTER — Telehealth (INDEPENDENT_AMBULATORY_CARE_PROVIDER_SITE_OTHER): Payer: Self-pay | Admitting: Orthopaedic Surgery

## 2016-11-28 NOTE — Telephone Encounter (Signed)
Can you check on this? thanks

## 2016-11-28 NOTE — Telephone Encounter (Signed)
I spoke with patient and advised Dr. Ninfa Linden does not normally order CPM.  She stated she would like to have for her stiffness.  I called Dr. Ninfa Linden and he stated he would order if she wanted.  Sent order to [place].  Spoke with Ruby Cola from [employer], they can deliver tomorrow.  I called patient and advised.

## 2016-11-28 NOTE — Telephone Encounter (Signed)
Pt called about a CPM machine she was suppose to get after surgery. Wants to know how to get this.  [phone]

## 2016-11-29 DIAGNOSIS — Z96653 Presence of artificial knee joint, bilateral: Secondary | ICD-10-CM | POA: Diagnosis not present

## 2016-11-29 DIAGNOSIS — Z86711 Personal history of pulmonary embolism: Secondary | ICD-10-CM | POA: Diagnosis not present

## 2016-11-29 DIAGNOSIS — Z471 Aftercare following joint replacement surgery: Secondary | ICD-10-CM | POA: Diagnosis not present

## 2016-11-29 DIAGNOSIS — F329 Major depressive disorder, single episode, unspecified: Secondary | ICD-10-CM | POA: Diagnosis not present

## 2016-12-01 ENCOUNTER — Telehealth (INDEPENDENT_AMBULATORY_CARE_PROVIDER_SITE_OTHER): Payer: Self-pay | Admitting: Orthopaedic Surgery

## 2016-12-01 DIAGNOSIS — Z86711 Personal history of pulmonary embolism: Secondary | ICD-10-CM | POA: Diagnosis not present

## 2016-12-01 DIAGNOSIS — F329 Major depressive disorder, single episode, unspecified: Secondary | ICD-10-CM | POA: Diagnosis not present

## 2016-12-01 DIAGNOSIS — Z96653 Presence of artificial knee joint, bilateral: Secondary | ICD-10-CM | POA: Diagnosis not present

## 2016-12-01 DIAGNOSIS — Z471 Aftercare following joint replacement surgery: Secondary | ICD-10-CM | POA: Diagnosis not present

## 2016-12-01 NOTE — Telephone Encounter (Signed)
PHYS THERAPIST FLOR WANTS TO REQUEST VERBAL FOR 3X A WEEK FOR 2 WEEKS.  [phone]

## 2016-12-01 NOTE — Telephone Encounter (Signed)
Verbal order given  

## 2016-12-03 DIAGNOSIS — F329 Major depressive disorder, single episode, unspecified: Secondary | ICD-10-CM | POA: Diagnosis not present

## 2016-12-03 DIAGNOSIS — Z471 Aftercare following joint replacement surgery: Secondary | ICD-10-CM | POA: Diagnosis not present

## 2016-12-03 DIAGNOSIS — Z86711 Personal history of pulmonary embolism: Secondary | ICD-10-CM | POA: Diagnosis not present

## 2016-12-03 DIAGNOSIS — Z96653 Presence of artificial knee joint, bilateral: Secondary | ICD-10-CM | POA: Diagnosis not present

## 2016-12-05 DIAGNOSIS — F329 Major depressive disorder, single episode, unspecified: Secondary | ICD-10-CM | POA: Diagnosis not present

## 2016-12-05 DIAGNOSIS — Z471 Aftercare following joint replacement surgery: Secondary | ICD-10-CM | POA: Diagnosis not present

## 2016-12-05 DIAGNOSIS — Z96653 Presence of artificial knee joint, bilateral: Secondary | ICD-10-CM | POA: Diagnosis not present

## 2016-12-05 DIAGNOSIS — Z86711 Personal history of pulmonary embolism: Secondary | ICD-10-CM | POA: Diagnosis not present

## 2016-12-08 DIAGNOSIS — Z86711 Personal history of pulmonary embolism: Secondary | ICD-10-CM | POA: Diagnosis not present

## 2016-12-08 DIAGNOSIS — Z96653 Presence of artificial knee joint, bilateral: Secondary | ICD-10-CM | POA: Diagnosis not present

## 2016-12-08 DIAGNOSIS — Z471 Aftercare following joint replacement surgery: Secondary | ICD-10-CM | POA: Diagnosis not present

## 2016-12-08 DIAGNOSIS — F329 Major depressive disorder, single episode, unspecified: Secondary | ICD-10-CM | POA: Diagnosis not present

## 2016-12-09 ENCOUNTER — Encounter (INDEPENDENT_AMBULATORY_CARE_PROVIDER_SITE_OTHER): Payer: Self-pay | Admitting: Orthopaedic Surgery

## 2016-12-09 ENCOUNTER — Ambulatory Visit (INDEPENDENT_AMBULATORY_CARE_PROVIDER_SITE_OTHER): Payer: Medicare Other | Admitting: Physician Assistant

## 2016-12-09 DIAGNOSIS — Z96651 Presence of right artificial knee joint: Secondary | ICD-10-CM

## 2016-12-09 MED ORDER — OXYCODONE-ACETAMINOPHEN 5-325 MG PO TABS: 1.0000 | ORAL_TABLET | ORAL | 0 refills | Status: DC | PRN

## 2016-12-09 NOTE — Progress Notes (Signed)
[name] returns today 2 weeks status post right total knee arthroplasty. [name] overall doing well no fevers chills shortness breath calf pain. States her pain is 4 out of 10 at most. She is taking Percocet.  Physical exam right knee surgical incisions healing well no signs of infection she has full extension and flexion to 90. Right calf supple nontender.  Plan:We refilled her Percocet today. See her back in 1 month check progress lack of. New Steri-Strips applied. She understands she can shower and get incision wet. Scar tissue mobilization encouraged. Set her up for outpatient physical therapy and [facility].

## 2016-12-10 DIAGNOSIS — Z471 Aftercare following joint replacement surgery: Secondary | ICD-10-CM | POA: Diagnosis not present

## 2016-12-10 DIAGNOSIS — F329 Major depressive disorder, single episode, unspecified: Secondary | ICD-10-CM | POA: Diagnosis not present

## 2016-12-10 DIAGNOSIS — Z96653 Presence of artificial knee joint, bilateral: Secondary | ICD-10-CM | POA: Diagnosis not present

## 2016-12-10 DIAGNOSIS — Z86711 Personal history of pulmonary embolism: Secondary | ICD-10-CM | POA: Diagnosis not present

## 2016-12-12 DIAGNOSIS — Z86711 Personal history of pulmonary embolism: Secondary | ICD-10-CM | POA: Diagnosis not present

## 2016-12-12 DIAGNOSIS — Z96653 Presence of artificial knee joint, bilateral: Secondary | ICD-10-CM | POA: Diagnosis not present

## 2016-12-12 DIAGNOSIS — Z471 Aftercare following joint replacement surgery: Secondary | ICD-10-CM | POA: Diagnosis not present

## 2016-12-12 DIAGNOSIS — F329 Major depressive disorder, single episode, unspecified: Secondary | ICD-10-CM | POA: Diagnosis not present

## 2016-12-15 ENCOUNTER — Other Ambulatory Visit (INDEPENDENT_AMBULATORY_CARE_PROVIDER_SITE_OTHER): Payer: Self-pay

## 2016-12-15 ENCOUNTER — Telehealth (INDEPENDENT_AMBULATORY_CARE_PROVIDER_SITE_OTHER): Payer: Self-pay | Admitting: Orthopaedic Surgery

## 2016-12-15 DIAGNOSIS — Z96651 Presence of right artificial knee joint: Secondary | ICD-10-CM

## 2016-12-15 NOTE — Telephone Encounter (Signed)
Order put in.

## 2016-12-15 NOTE — Telephone Encounter (Signed)
Patient called needing verbal orders for PT at [facility] [phone]

## 2016-12-16 ENCOUNTER — Ambulatory Visit (HOSPITAL_COMMUNITY): Payer: Medicare Other | Attending: Orthopaedic Surgery | Admitting: Physical Therapy

## 2016-12-16 DIAGNOSIS — R600 Localized edema: Secondary | ICD-10-CM

## 2016-12-16 DIAGNOSIS — M25561 Pain in right knee: Secondary | ICD-10-CM

## 2016-12-16 DIAGNOSIS — M6281 Muscle weakness (generalized): Secondary | ICD-10-CM | POA: Diagnosis not present

## 2016-12-16 DIAGNOSIS — M25661 Stiffness of right knee, not elsewhere classified: Secondary | ICD-10-CM | POA: Insufficient documentation

## 2016-12-16 NOTE — Patient Instructions (Addendum)
Strengthening: Quadriceps Set    Tighten muscles on top of thighs by pushing knees down into surface. Hold __5__ seconds. Repeat _10___ times per set. Do _1___ sets per session. Do ___2_ sessions per day.  http://orth.exer.us/602   Copyright  VHI. All rights reserved.  Self-Mobilization: Heel Slide (Supine)    Slide right  heel toward buttocks until a gentle stretch is felt. Hold __3-5__ seconds. Relax. Repeat _10___ times per set. Do _1___ sets per session. Do __2__ sessions per day.  http://orth.exer.us/710   Copyright  VHI. All rights reserved.  Self-Mobilization: Knee Flexion (Prone)    Bring right heel toward buttocks as close as possible. Hold __3-5__ seconds. Relax. Repeat 10____ times per set. Do _1___ sets per session. Do __2__ sessions per day.  http://orth.exer.us/596   Copyright  VHI. All rights reserved.  Strengthening: Hip Extension (Prone)   Pillow under your lower abdomen.  Tighten muscles on front of left thigh, then lift leg _2___ inches from surface, keeping knee locked.Repeat to right  Repeat _10___ times per set. Do __1__ sets per session. Do _2___ sessions per day.  http://orth.exer.us/620   Copyright  VHI. All rights reserved.

## 2016-12-16 NOTE — Therapy (Signed)
[place], [place], [address] Phone: [phone]   Fax:  [phone]  Physical Therapy Evaluation  Patient Details  Name: [name] MRN: [id number] Date of Birth: [birth year] Referring Provider: Santiago Bumpers  Encounter Date: 12/16/2016      PT End of Session - 12/16/16 1026    Visit Number 1   Number of Visits 8   Date for PT Re-Evaluation 01/15/17   Authorization Type medicare   Authorization - Visit Number 1   Authorization - Number of Visits 8   PT Start Time 0900   PT Stop Time 0940   PT Time Calculation (min) 40 min   Activity Tolerance Patient tolerated treatment well   Behavior During Therapy [facility] for tasks assessed/performed      Past Medical History:  Diagnosis Date  . Allergy    takes Claritin daily  . Arthritis   . Complication of anesthesia   . Constipation    takes Senokot daily  . Depression    takes Lexapro daily  . Family history of adverse reaction to anesthesia    sister has a hard time waking up  . GERD (gastroesophageal reflux disease)   . History of blood clots 1979   after C/S clot went to lung  . History of bronchitis    3 yrs ago  . History of migraine   . Hypothyroidism    takes Synthroid daily  . Joint pain   . Joint swelling   . Personal history of colonic polyps    benign  . PONV (postoperative nausea and vomiting)   . Rheumatic fever 1958   hospitalized for three months  . Urinary leakage    takes Ditropan daily  . Vitamin D deficiency    takes Vit D daily    Past Surgical History:  Procedure Laterality Date  . APPENDECTOMY     incidental at time of C-section  . BACK SURGERY  1986  . cataract surgery Bilateral   . CESAREAN SECTION  1979  . CHOLECYSTECTOMY  1982  . COLONOSCOPY  2004   Dr. Imogene Burn polyps, left sided diverticula. Path unavailable.   . COLONOSCOPY  02/12/2011   Procedure: COLONOSCOPY;  Surgeon: Daneil Dolin, MD;   [facility];  Service: Endoscopy;  Laterality: N/A;  . ESOPHAGOGASTRODUODENOSCOPY  02/12/2011   Procedure: ESOPHAGOGASTRODUODENOSCOPY (EGD);  Surgeon: Daneil Dolin, MD;  [facility];  Service: Endoscopy;  Laterality: N/A;  . GASTRIC ROUX-EN-Y N/A 06/19/2015   Procedure: LAPAROSCOPIC ROUX-EN-Y GASTRIC BYPASS WITH UPPER ENDOSCOPY;  Surgeon: Excell Seltzer, MD;  [facility];  Service: General;  Laterality: N/A;  . [place]   left  . TOTAL KNEE ARTHROPLASTY  10/2009   left  . TOTAL KNEE ARTHROPLASTY Right 11/25/2016   Procedure: RIGHT TOTAL KNEE ARTHROPLASTY;  Surgeon: Mcarthur Rossetti, MD;  Location: [place];  Service: Orthopedics;  Laterality: Right;  . UPPER GI ENDOSCOPY  06/19/2015   Procedure: UPPER GI ENDOSCOPY;  Surgeon: Excell Seltzer, MD;  [facility];  Service: General;;    There were no vitals filed for this visit.       Subjective Assessment - 12/16/16 0903    Subjective [name] states that she elected to have a TKR on 11/25/2016 she was discharged on 4/26 to home health therapy and is now being referred to skilled out-patient physical therapy to maximize her functional ability.     Pertinent History  TKR left    Limitations Sitting;Lifting;Standing;Walking;House hold activities   How long can you sit comfortably? Able to sit for 20 minutes    How long can you stand comfortably? no problem   How long can you walk comfortably? walking with a cane no problem    Patient Stated Goals walk without cane, get back silver sneakers back to bowling    Currently in Pain? No/denies  Highest pain 2/10            [facility] PT Assessment - 12/16/16 0001      Assessment   Medical Diagnosis Rt TKR   Referring Provider Santiago Bumpers   Onset Date/Surgical Date 11/25/16   Next MD Visit 01/06/2017   Prior Therapy acute and HH     Precautions   Precautions None     Restrictions   Weight Bearing Restrictions No     Balance  Screen   Has the patient fallen in the past 6 months No   Has the patient had a decrease in activity level because of a fear of falling?  Yes   Is the patient reluctant to leave their home because of a fear of falling?  No     Home Environment   Living Environment Private residence     Prior Function   Level of Independence Independent     Observation/Other Assessments   Focus on Therapeutic Outcomes (FOTO)  72     Functional Tests   Functional tests Single leg stance;Sit to Stand     Single Leg Stance   Comments bilaterally 60"     Sit to Stand   Comments unable without using her hands      ROM / Strength   AROM / PROM / Strength AROM;Strength     AROM   AROM Assessment Site Knee   Right/Left Knee Right   Right Knee Extension 0   Right Knee Flexion 111     Strength   Strength Assessment Site Hip;Knee;Ankle   Right/Left Hip Right;Left   Right Hip Flexion 5/5   Right Hip Extension 3/5   Right Hip ABduction 5/5   Left Hip Extension 3/5   Right/Left Knee Right;Left   Right Knee Flexion 4-/5   Right Knee Extension 4+/5   Left Knee Flexion 5/5   Left Knee Extension 5/5   Right/Left Ankle Right;Left   Right Ankle Dorsiflexion 5/5                   [facility] Adult PT Treatment/Exercise - 12/16/16 0001      Exercises   Exercises Knee/Hip     Knee/Hip Exercises: Standing   Heel Raises Both;10 reps   Functional Squat 10 reps   SLS 60"     Knee/Hip Exercises: Supine   Quad Sets 10 reps     Knee/Hip Exercises: Prone   Hamstring Curl 5 reps   Hip Extension Strengthening;Both;10 reps                PT Education - 12/16/16 0940    Education provided Yes   Education Details icing and HEP    Person(s) Educated Patient   Methods Explanation;Handout   Comprehension Verbalized understanding          PT Short Term Goals - 12/16/16 1144      PT SHORT TERM GOAL #1   Title Pt to be walking without an assistive device in her home   Time 2    Period Weeks   Status  New     PT SHORT TERM GOAL #2   Title Pt strength in bilateral hip extensors to be able to come sit to stand from lower height ie car with UE support with ease.   Time 2   Period Weeks   Status New     PT SHORT TERM GOAL #3   Title PT ROM in Rt knee to improve to 115 to allow pt to ride comfortably in a car for 45 minutes.    Time 2   Period Weeks   Status New           PT Long Term Goals - 12/16/16 1145      PT LONG TERM GOAL #1   Title Pt to be walking without an assistive device outside.    Time 4   Period Weeks   Status New     PT LONG TERM GOAL #2   Title Pt bilateral hip extensors strength increased one grade to allow pt to come from sit to stand with no UE assist.    Time 4   Period Weeks   Status New     PT LONG TERM GOAL #3   Title Pt right knee ROM and hip extension strength to be increased to allow pt to squat to pick items off the ground using proper body mechanics    Time 4   Period Weeks   Status New     PT LONG TERM GOAL #4   Title Pt pain to be at a 0/10 to allow pt to feel comfortable going bace to silver sneakers.    Time 4   Period Weeks   Status New               Plan - 12/16/16 1027    Clinical Impression Statement [name] is a 76 yo female who opted to have a total knee replacement on her Right knee on 11/25/2016. She has been discharged from home health services and is now being referred to out-patient therapy to maximize her functional ability.  Examination demonstrates decreased ROM, decreased strength, abnormal gait, decreased actiivity tolerance, slight increased pain and increased edema.  [name] will benefit from skilled physical therapy to address these issues and maximize her functional ability.    Rehab Potential Good   PT Frequency 2x / week   PT Duration 4 weeks   PT Treatment/Interventions ADLs/Self Care Home Management;Therapeutic activities;Therapeutic exercise;Patient/family education;Manual  techniques;Passive range of motion   PT Next Visit Plan Begin : heel raises, functional squat ,sit to stand, rockerboard, standing terminal extension and knee flexion as well as manual to decrease edema and increase ROM.   Focus on ROM and strengthening hamstrings and gluteal mm progress to step ups, stool scoots and steps    PT Home Exercise Plan 5/15: quad set, heel slide, prone hip extension, and prone hamstring curls.       Patient will benefit from skilled therapeutic intervention in order to improve the following deficits and impairments:  Abnormal gait, Decreased activity tolerance, Decreased range of motion, Decreased strength, Difficulty walking, Increased edema, Pain  Visit Diagnosis: Stiffness of right knee, not elsewhere classified - Plan: PT plan of care cert/re-cert  Localized edema - Plan: PT plan of care cert/re-cert  Muscle weakness (generalized) - Plan: PT plan of care cert/re-cert  Acute pain of right knee - Plan: PT plan of care cert/re-cert      G-Codes - 25/63/89 1150    Functional Assessment Tool Used (  Outpatient Only) foto   Functional Limitation Mobility: Walking and moving around   Mobility: Walking and Moving Around Current Status [phone]) At least 20 percent but less than 40 percent impaired, limited or restricted   Mobility: Walking and Moving Around Goal Status [phone]) At least 1 percent but less than 20 percent impaired, limited or restricted       Problem List Patient Active Problem List   Diagnosis Date Noted  . Status post total right knee replacement 11/25/2016  . Unilateral primary osteoarthritis, right knee 10/22/2016  . Trigger thumb, left thumb 10/22/2016  . Morbid obesity ([place]) 06/19/2015  . GERD (gastroesophageal reflux disease) 01/20/2011  . Colon polyp 01/20/2011  . Hemorrhoids 01/20/2011    Rayetta Humphrey, PT CLT [phone] 12/16/2016, 11:53 AM  [place], [place], [address] Phone: [phone]   Fax:  [phone]  Name: [name] MRN: [id number] Date of Birth: April 22, 1941

## 2016-12-18 ENCOUNTER — Telehealth (INDEPENDENT_AMBULATORY_CARE_PROVIDER_SITE_OTHER): Payer: Self-pay | Admitting: Orthopaedic Surgery

## 2016-12-18 ENCOUNTER — Ambulatory Visit (HOSPITAL_COMMUNITY): Payer: Medicare Other | Admitting: Physical Therapy

## 2016-12-18 DIAGNOSIS — R600 Localized edema: Secondary | ICD-10-CM | POA: Diagnosis not present

## 2016-12-18 DIAGNOSIS — M6281 Muscle weakness (generalized): Secondary | ICD-10-CM | POA: Diagnosis not present

## 2016-12-18 DIAGNOSIS — M25661 Stiffness of right knee, not elsewhere classified: Secondary | ICD-10-CM

## 2016-12-18 DIAGNOSIS — M25561 Pain in right knee: Secondary | ICD-10-CM | POA: Diagnosis not present

## 2016-12-18 NOTE — Telephone Encounter (Signed)
Please advise 

## 2016-12-18 NOTE — Therapy (Signed)
[place], [place], [address] Phone: [phone]   Fax:  [phone]  Physical Therapy Treatment  Patient Details  Name: [name] MRN: [id number] Date of Birth: 1941-02-13 Referring Provider: Santiago Bumpers  Encounter Date: 12/18/2016      PT End of Session - 12/18/16 1746    Visit Number 2   Number of Visits 8   Date for PT Re-Evaluation 01/15/17   Authorization Type medicare   Authorization - Visit Number 2   Authorization - Number of Visits 8   PT Start Time 2836   PT Stop Time 1726   PT Time Calculation (min) 38 min   Activity Tolerance Patient tolerated treatment well   Behavior During Therapy [facility] for tasks assessed/performed      Past Medical History:  Diagnosis Date  . Allergy    takes Claritin daily  . Arthritis   . Complication of anesthesia   . Constipation    takes Senokot daily  . Depression    takes Lexapro daily  . Family history of adverse reaction to anesthesia    sister has a hard time waking up  . GERD (gastroesophageal reflux disease)   . History of blood clots 1979   after C/S clot went to lung  . History of bronchitis    3 yrs ago  . History of migraine   . Hypothyroidism    takes Synthroid daily  . Joint pain   . Joint swelling   . Personal history of colonic polyps    benign  . PONV (postoperative nausea and vomiting)   . Rheumatic fever 1958   hospitalized for three months  . Urinary leakage    takes Ditropan daily  . Vitamin D deficiency    takes Vit D daily    Past Surgical History:  Procedure Laterality Date  . APPENDECTOMY     incidental at time of C-section  . BACK SURGERY  1986  . cataract surgery Bilateral   . CESAREAN SECTION  1979  . CHOLECYSTECTOMY  1982  . COLONOSCOPY  2004   Dr. Imogene Burn polyps, left sided diverticula. Path unavailable.   . COLONOSCOPY  02/12/2011   Procedure: COLONOSCOPY;  Surgeon: Daneil Dolin, MD;   [facility];  Service: Endoscopy;  Laterality: N/A;  . ESOPHAGOGASTRODUODENOSCOPY  02/12/2011   Procedure: ESOPHAGOGASTRODUODENOSCOPY (EGD);  Surgeon: Daneil Dolin, MD;  [facility];  Service: Endoscopy;  Laterality: N/A;  . GASTRIC ROUX-EN-Y N/A 06/19/2015   Procedure: LAPAROSCOPIC ROUX-EN-Y GASTRIC BYPASS WITH UPPER ENDOSCOPY;  Surgeon: Excell Seltzer, MD;  [facility];  Service: General;  Laterality: N/A;  . [place]   left  . TOTAL KNEE ARTHROPLASTY  10/2009   left  . TOTAL KNEE ARTHROPLASTY Right 11/25/2016   Procedure: RIGHT TOTAL KNEE ARTHROPLASTY;  Surgeon: Mcarthur Rossetti, MD;  Location: [place];  Service: Orthopedics;  Laterality: Right;  . UPPER GI ENDOSCOPY  06/19/2015   Procedure: UPPER GI ENDOSCOPY;  Surgeon: Excell Seltzer, MD;  [facility];  Service: General;;    There were no vitals filed for this visit.      Subjective Assessment - 12/18/16 1717    Subjective Pt states she is doing well and having no pain. STates she's been riding her stationary bike at home.  Comes today without AD  [place] Adult PT Treatment/Exercise - 12/18/16 0001      Knee/Hip Exercises: Standing   Heel Raises Both;15 reps   Functional Squat 10 reps   SLS with Vectors 5 X 5" holds Rt LE     Knee/Hip Exercises: Seated   Sit to Sand 10 reps;without UE support;2 sets  in standard chair with pillow in chair     Knee/Hip Exercises: Supine   Bridges with Ball Squeeze 10 reps;2 sets   Straight Leg Raises Right;2 sets;10 reps   Knee Extension Limitations 0   Knee Flexion Limitations 112     Knee/Hip Exercises: Prone   Hamstring Curl 15 reps   Hip Extension 2 sets;10 reps                PT Education - 12/18/16 1751    Education provided Yes   Education Details reviewed HEP and intiial evaluation goals.  Educated with manual for edema control and scar reduction.   Person(s) Educated Patient    Methods Explanation;Demonstration;Tactile cues;Verbal cues;Handout   Comprehension Verbalized understanding;Returned demonstration;Verbal cues required;Tactile cues required          PT Short Term Goals - 12/16/16 1144      PT SHORT TERM GOAL #1   Title Pt to be walking without an assistive device in her home   Time 2   Period Weeks   Status New     PT SHORT TERM GOAL #2   Title Pt strength in bilateral hip extensors to be able to come sit to stand from lower height ie car with UE support with ease.   Time 2   Period Weeks   Status New     PT SHORT TERM GOAL #3   Title PT ROM in Rt knee to improve to 115 to allow pt to ride comfortably in a car for 45 minutes.    Time 2   Period Weeks   Status New           PT Long Term Goals - 12/16/16 1145      PT LONG TERM GOAL #1   Title Pt to be walking without an assistive device outside.    Time 4   Period Weeks   Status New     PT LONG TERM GOAL #2   Title Pt bilateral hip extensors strength increased one grade to allow pt to come from sit to stand with no UE assist.    Time 4   Period Weeks   Status New     PT LONG TERM GOAL #3   Title Pt right knee ROM and hip extension strength to be increased to allow pt to squat to pick items off the ground using proper body mechanics    Time 4   Period Weeks   Status New     PT LONG TERM GOAL #4   Title Pt pain to be at a 0/10 to allow pt to feel comfortable going bace to silver sneakers.    Time 4   Period Weeks   Status New               Plan - 12/18/16 1746    Clinical Impression Statement Reviewed HEP and initial evaluation goals with patient.  pt is doing extremely well.  Progressed with glute strengtheing, LE stability and education regarding self massage and edema control.  Pt is ambulating without AD today and no gait instabiliy noted.  Encouraged to continue therex.     Rehab  Potential Good   PT Frequency 2x / week   PT Duration 4 weeks   PT  Treatment/Interventions ADLs/Self Care Home Management;Therapeutic activities;Therapeutic exercise;Patient/family education;Manual techniques;Passive range of motion   PT Next Visit Plan Pt is a higher level knee with ROM 0-112.  Next session instruct in self flexion stretch, begin step ups, stool scoots and balance activities.  Complete manual as needed.    PT Home Exercise Plan 5/15: quad set, heel slide, prone hip extension, and prone hamstring curls.       Patient will benefit from skilled therapeutic intervention in order to improve the following deficits and impairments:  Abnormal gait, Decreased activity tolerance, Decreased range of motion, Decreased strength, Difficulty walking, Increased edema, Pain  Visit Diagnosis: Stiffness of right knee, not elsewhere classified  Localized edema  Muscle weakness (generalized)  Acute pain of right knee     Problem List Patient Active Problem List   Diagnosis Date Noted  . Status post total right knee replacement 11/25/2016  . Unilateral primary osteoarthritis, right knee 10/22/2016  . Trigger thumb, left thumb 10/22/2016  . Morbid obesity ([place]) 06/19/2015  . GERD (gastroesophageal reflux disease) 01/20/2011  . Colon polyp 01/20/2011  . Hemorrhoids 01/20/2011    Teena Irani, PTA/CLT [phone]  12/18/2016, 5:52 PM  [place] [address] [place], [place], [address] Phone: [phone]   Fax:  [phone]  Name: ANJELA CASSARA MRN: [id number] Date of Birth: 03/07/1941

## 2016-12-18 NOTE — Telephone Encounter (Signed)
Patient called asked if she can drive now. Patient also said she stopped taking the pain medicine. The number to contact patient is [phone]

## 2016-12-18 NOTE — Telephone Encounter (Signed)
Patient is aware she can drive. 

## 2016-12-18 NOTE — Telephone Encounter (Signed)
She is cleared to drive

## 2016-12-23 ENCOUNTER — Ambulatory Visit (HOSPITAL_COMMUNITY): Payer: Medicare Other

## 2016-12-23 DIAGNOSIS — R600 Localized edema: Secondary | ICD-10-CM

## 2016-12-23 DIAGNOSIS — M6281 Muscle weakness (generalized): Secondary | ICD-10-CM

## 2016-12-23 DIAGNOSIS — M25661 Stiffness of right knee, not elsewhere classified: Secondary | ICD-10-CM | POA: Diagnosis not present

## 2016-12-23 DIAGNOSIS — M25561 Pain in right knee: Secondary | ICD-10-CM | POA: Diagnosis not present

## 2016-12-23 NOTE — Therapy (Signed)
[place], [place], [address] Phone: [phone]   Fax:  [phone]  Physical Therapy Treatment  Patient Details  Name: [name] MRN: [id number] Date of Birth: [birth year] Referring Provider: Santiago Bumpers  Encounter Date: 12/23/2016      PT End of Session - 12/23/16 1201    Visit Number 3   Number of Visits 8   Date for PT Re-Evaluation 01/15/17   Authorization Type medicare   Authorization - Visit Number 3   Authorization - Number of Visits 8   PT Start Time 1120   PT Stop Time 1200   PT Time Calculation (min) 40 min   Activity Tolerance Patient tolerated treatment well;No increased pain   Behavior During Therapy WFL for tasks assessed/performed      Past Medical History:  Diagnosis Date  . Allergy    takes Claritin daily  . Arthritis   . Complication of anesthesia   . Constipation    takes Senokot daily  . Depression    takes Lexapro daily  . Family history of adverse reaction to anesthesia    sister has a hard time waking up  . GERD (gastroesophageal reflux disease)   . History of blood clots 1979   after C/S clot went to lung  . History of bronchitis    3 yrs ago  . History of migraine   . Hypothyroidism    takes Synthroid daily  . Joint pain   . Joint swelling   . Personal history of colonic polyps    benign  . PONV (postoperative nausea and vomiting)   . Rheumatic fever 1958   hospitalized for three months  . Urinary leakage    takes Ditropan daily  . Vitamin D deficiency    takes Vit D daily    Past Surgical History:  Procedure Laterality Date  . APPENDECTOMY     incidental at time of C-section  . BACK SURGERY  1986  . cataract surgery Bilateral   . CESAREAN SECTION  1979  . CHOLECYSTECTOMY  1982  . COLONOSCOPY  2004   Dr. Imogene Burn polyps, left sided diverticula. Path unavailable.   . COLONOSCOPY  02/12/2011   Procedure: COLONOSCOPY;  Surgeon: Daneil Dolin, MD;  [facility];  Service: Endoscopy;  Laterality: N/A;  . ESOPHAGOGASTRODUODENOSCOPY  02/12/2011   Procedure: ESOPHAGOGASTRODUODENOSCOPY (EGD);  Surgeon: Daneil Dolin, MD;  [facility];  Service: Endoscopy;  Laterality: N/A;  . GASTRIC ROUX-EN-Y N/A 06/19/2015   Procedure: LAPAROSCOPIC ROUX-EN-Y GASTRIC BYPASS WITH UPPER ENDOSCOPY;  Surgeon: Excell Seltzer, MD;  [facility];  Service: General;  Laterality: N/A;  . [place]   left  . TOTAL KNEE ARTHROPLASTY  10/2009   left  . TOTAL KNEE ARTHROPLASTY Right 11/25/2016   Procedure: RIGHT TOTAL KNEE ARTHROPLASTY;  Surgeon: Mcarthur Rossetti, MD;  Location: [facility];  Service: Orthopedics;  Laterality: Right;  . UPPER GI ENDOSCOPY  06/19/2015   Procedure: UPPER GI ENDOSCOPY;  Surgeon: Excell Seltzer, MD;  [facility];  Service: General;;    There were no vitals filed for this visit.      Subjective Assessment - 12/23/16 1126    Subjective Pt stated minimal pain on medial aspect of Rt knee, pain scale 1/10.  Reports compliance wiht HEP daily,   Pertinent History TKR left    Patient Stated Goals walk without cane, get back silver sneakers back to bowling  Currently in Pain? Yes   Pain Score 1    Pain Location Knee   Pain Orientation Right;Medial   Pain Descriptors / Indicators Dull;Aching   Pain Type Surgical pain   Pain Onset 1 to 4 weeks ago   Pain Frequency Intermittent   Aggravating Factors  end range with flexion exercises   Pain Relieving Factors tylonel, ice and rest   Effect of Pain on Daily Activities doesnt effect ADL                         [facility] Adult PT Treatment/Exercise - 12/23/16 0001      Knee/Hip Exercises: Stretches   Knee: Self-Stretch Limitations Knee drives on 2nd step 95A 5" holds for flexion stretch     Knee/Hip Exercises: Standing   Heel Raises 15 reps   Heel Raises Limitations heel and toe raises   Lateral Step Up Right;10  reps;Hand Hold: 2;Step Height: 4"   Forward Step Up Right;10 reps;Hand Hold: 1;Step Height: 4"   Functional Squat 10 reps   Functional Squat Limitations verbal, visual and tactile cueing for equal   SLS with Vectors 5 X 5" holds Rt LE     Knee/Hip Exercises: Seated   Stool Scoot - Round Trips 1RT  encouraged to wear tennis shoes next session   Sit to Sand 10 reps;without UE support  no HHA; lowest mat height wiht addition of pillow for A     Knee/Hip Exercises: Supine   Bridges with Ball Squeeze 15 reps   Knee Flexion Limitations 120     Knee/Hip Exercises: Prone   Hamstring Curl 15 reps   Hip Extension 15 reps                  PT Short Term Goals - 12/16/16 1144      PT SHORT TERM GOAL #1   Title Pt to be walking without an assistive device in her home   Time 2   Period Weeks   Status New     PT SHORT TERM GOAL #2   Title Pt strength in bilateral hip extensors to be able to come sit to stand from lower height ie car with UE support with ease.   Time 2   Period Weeks   Status New     PT SHORT TERM GOAL #3   Title PT ROM in Rt knee to improve to 115 to allow pt to ride comfortably in a car for 45 minutes.    Time 2   Period Weeks   Status New           PT Long Term Goals - 12/16/16 1145      PT LONG TERM GOAL #1   Title Pt to be walking without an assistive device outside.    Time 4   Period Weeks   Status New     PT LONG TERM GOAL #2   Title Pt bilateral hip extensors strength increased one grade to allow pt to come from sit to stand with no UE assist.    Time 4   Period Weeks   Status New     PT LONG TERM GOAL #3   Title Pt right knee ROM and hip extension strength to be increased to allow pt to squat to pick items off the ground using proper body mechanics    Time 4   Period Weeks   Status New     PT LONG TERM  GOAL #4   Title Pt pain to be at a 0/10 to allow pt to feel comfortable going bace to silver sneakers.    Time 4   Period Weeks    Status New               Plan - 12/23/16 1201    Clinical Impression Statement Pt making good progress towards goals with reports of compliance daily with HEP and AROM at 120 degrees flexion this session.  Instructed knee drives to begin self flexion stretch at home and progressed to CKC exercises.  Pt able to complete all therex with minimal difficulty, added step up/lateral step up for quad strengthening.     Rehab Potential Good   PT Frequency 2x / week   PT Duration 4 weeks   PT Treatment/Interventions ADLs/Self Care Home Management;Therapeutic activities;Therapeutic exercise;Patient/family education;Manual techniques;Passive range of motion   PT Next Visit Plan Pt is a higher level knee with ROM 0-120.  Next session progress CKC with step down and dynamic balance activities.  Complete manual as needed.    PT Home Exercise Plan 5/15: quad set, heel slide, prone hip extension, and prone hamstring curls.       Patient will benefit from skilled therapeutic intervention in order to improve the following deficits and impairments:  Abnormal gait, Decreased activity tolerance, Decreased range of motion, Decreased strength, Difficulty walking, Increased edema, Pain  Visit Diagnosis: Stiffness of right knee, not elsewhere classified  Localized edema  Muscle weakness (generalized)  Acute pain of right knee     Problem List Patient Active Problem List   Diagnosis Date Noted  . Status post total right knee replacement 11/25/2016  . Unilateral primary osteoarthritis, right knee 10/22/2016  . Trigger thumb, left thumb 10/22/2016  . Morbid obesity ([place]) 06/19/2015  . GERD (gastroesophageal reflux disease) 01/20/2011  . Colon polyp 01/20/2011  . Hemorrhoids 01/20/2011   Ihor Austin, [place]; [place]  Aldona Lento 12/23/2016, 12:07 PM  [facility] [address] [place], [place], [address] Phone: [phone]    Fax:  [phone]  Name: KOURTNEE LAHEY MRN: [id number] Date of Birth: 10-10-1940

## 2016-12-25 ENCOUNTER — Ambulatory Visit (HOSPITAL_COMMUNITY): Payer: Medicare Other

## 2016-12-25 DIAGNOSIS — M25561 Pain in right knee: Secondary | ICD-10-CM | POA: Diagnosis not present

## 2016-12-25 DIAGNOSIS — R600 Localized edema: Secondary | ICD-10-CM

## 2016-12-25 DIAGNOSIS — M25661 Stiffness of right knee, not elsewhere classified: Secondary | ICD-10-CM | POA: Diagnosis not present

## 2016-12-25 DIAGNOSIS — M6281 Muscle weakness (generalized): Secondary | ICD-10-CM

## 2016-12-25 NOTE — Therapy (Signed)
[facility] [place], [place], [address] Phone: [phone]   Fax:  [phone]  Physical Therapy Treatment  Patient Details  Name: [name] MRN: [id number] Date of Birth: Sep 06, 1940 Referring Provider: Santiago Bumpers  Encounter Date: 12/25/2016      PT End of Session - 12/25/16 1739    Visit Number 4   Number of Visits 8   Date for PT Re-Evaluation 01/15/17   Authorization Type medicare   Authorization - Visit Number 4   Authorization - Number of Visits 8   PT Start Time 9678   PT Stop Time 9381   PT Time Calculation (min) 39 min   Activity Tolerance Patient tolerated treatment well;No increased pain   Behavior During Therapy WFL for tasks assessed/performed      Past Medical History:  Diagnosis Date  . Allergy    takes Claritin daily  . Arthritis   . Complication of anesthesia   . Constipation    takes Senokot daily  . Depression    takes Lexapro daily  . Family history of adverse reaction to anesthesia    sister has a hard time waking up  . GERD (gastroesophageal reflux disease)   . History of blood clots 1979   after C/S clot went to lung  . History of bronchitis    3 yrs ago  . History of migraine   . Hypothyroidism    takes Synthroid daily  . Joint pain   . Joint swelling   . Personal history of colonic polyps    benign  . PONV (postoperative nausea and vomiting)   . Rheumatic fever 1958   hospitalized for three months  . Urinary leakage    takes Ditropan daily  . Vitamin D deficiency    takes Vit D daily    Past Surgical History:  Procedure Laterality Date  . APPENDECTOMY     incidental at time of C-section  . BACK SURGERY  1986  . cataract surgery Bilateral   . CESAREAN SECTION  1979  . CHOLECYSTECTOMY  1982  . COLONOSCOPY  2004   Dr. Imogene Burn polyps, left sided diverticula. Path unavailable.   . COLONOSCOPY  02/12/2011   Procedure: COLONOSCOPY;  Surgeon: Daneil Dolin, MD;  [facility];  Service: Endoscopy;  Laterality: N/A;  . ESOPHAGOGASTRODUODENOSCOPY  02/12/2011   Procedure: ESOPHAGOGASTRODUODENOSCOPY (EGD);  Surgeon: Daneil Dolin, MD;  [facility];  Service: Endoscopy;  Laterality: N/A;  . GASTRIC ROUX-EN-Y N/A 06/19/2015   Procedure: LAPAROSCOPIC ROUX-EN-Y GASTRIC BYPASS WITH UPPER ENDOSCOPY;  Surgeon: Excell Seltzer, MD;  [facility];  Service: General;  Laterality: N/A;  . [place]   left  . TOTAL KNEE ARTHROPLASTY  10/2009   left  . TOTAL KNEE ARTHROPLASTY Right 11/25/2016   Procedure: RIGHT TOTAL KNEE ARTHROPLASTY;  Surgeon: Mcarthur Rossetti, MD;  Location: [facility];  Service: Orthopedics;  Laterality: Right;  . UPPER GI ENDOSCOPY  06/19/2015   Procedure: UPPER GI ENDOSCOPY;  Surgeon: Excell Seltzer, MD;  [facility];  Service: General;;    There were no vitals filed for this visit.      Subjective Assessment - 12/25/16 1737    Subjective Pt stated minimal pain on lateral aspect of Rt knee, pain scale 3/10.     Pertinent History TKR left    Patient Stated Goals walk without cane, get back silver sneakers back to bowling    Currently  in Pain? Yes   Pain Score 3    Pain Location Knee   Pain Orientation Right;Lateral   Pain Descriptors / Indicators Discomfort   Pain Type Surgical pain   Pain Onset 1 to 4 weeks ago   Pain Frequency Intermittent   Aggravating Factors  end range with flexion exercises   Pain Relieving Factors tylonel, ice and rest   Effect of Pain on Daily Activities doesn't effect ADLs                         [facility] Adult PT Treatment/Exercise - 12/25/16 0001      Knee/Hip Exercises: Standing   Heel Raises 20 reps   Heel Raises Limitations heel and toe raises incline slope   Lateral Step Up Right;15 reps;Hand Hold: 1;Step Height: 4"   Forward Step Up Right;20 reps;Hand Hold: 1;Step Height: 4";Step Height: 6"   Forward Step Up Limitations 4  then 6in   Step Down Right;10 reps;Step Height: 4";Hand Hold: 1;Step Height: 6";Hand Hold: 2   Functional Squat 10 reps   Functional Squat Limitations infront of chair for form   SLS with Vectors 5 X 5" holds Rt LE   Other Standing Knee Exercises Tandem stance 3x 30" on foam     Knee/Hip Exercises: Seated   Stool Scoot - Round Trips 2RT   Sit to Sand 10 reps;without UE support                  PT Short Term Goals - 12/16/16 1144      PT SHORT TERM GOAL #1   Title Pt to be walking without an assistive device in her home   Time 2   Period Weeks   Status New     PT SHORT TERM GOAL #2   Title Pt strength in bilateral hip extensors to be able to come sit to stand from lower height ie car with UE support with ease.   Time 2   Period Weeks   Status New     PT SHORT TERM GOAL #3   Title PT ROM in Rt knee to improve to 115 to allow pt to ride comfortably in a car for 45 minutes.    Time 2   Period Weeks   Status New           PT Long Term Goals - 12/16/16 1145      PT LONG TERM GOAL #1   Title Pt to be walking without an assistive device outside.    Time 4   Period Weeks   Status New     PT LONG TERM GOAL #2   Title Pt bilateral hip extensors strength increased one grade to allow pt to come from sit to stand with no UE assist.    Time 4   Period Weeks   Status New     PT LONG TERM GOAL #3   Title Pt right knee ROM and hip extension strength to be increased to allow pt to squat to pick items off the ground using proper body mechanics    Time 4   Period Weeks   Status New     PT LONG TERM GOAL #4   Title Pt pain to be at a 0/10 to allow pt to feel comfortable going bace to silver sneakers.    Time 4   Period Weeks   Status New  Plan - 12/25/16 1814    Clinical Impression Statement Progressed this session with increased CKC for functional strengthening.  Added step downs and balance activties with min guard for safety.  Pt able to  demonstrate approriate form with therex, noted increased difficulty with step down training.     Rehab Potential Good   PT Frequency 2x / week   PT Duration 4 weeks   PT Treatment/Interventions ADLs/Self Care Home Management;Therapeutic activities;Therapeutic exercise;Patient/family education;Manual techniques;Passive range of motion   PT Next Visit Plan Pt is a higher level knee with ROM 0-120.  Continue with step down and dynamic balance activities.  Complete manual as needed.       Patient will benefit from skilled therapeutic intervention in order to improve the following deficits and impairments:  Abnormal gait, Decreased activity tolerance, Decreased range of motion, Decreased strength, Difficulty walking, Increased edema, Pain  Visit Diagnosis: Stiffness of right knee, not elsewhere classified  Localized edema  Muscle weakness (generalized)  Acute pain of right knee     Problem List Patient Active Problem List   Diagnosis Date Noted  . Status post total right knee replacement 11/25/2016  . Unilateral primary osteoarthritis, right knee 10/22/2016  . Trigger thumb, left thumb 10/22/2016  . Morbid obesity ([place]) 06/19/2015  . GERD (gastroesophageal reflux disease) 01/20/2011  . Colon polyp 01/20/2011  . Hemorrhoids 01/20/2011   Ihor Austin, [place]; [place]  Aldona Lento 12/25/2016, 6:17 PM  [facility] [address] [place], [place], [address] Phone: [phone]   Fax:  [phone]  Name: [name] MRN: [id number] Date of Birth: 1941-01-16

## 2016-12-25 NOTE — Patient Instructions (Signed)
Step: Up, Lateral    Stand with side toward step. Place involved leg up. Raise body using top leg only. Step off other side, involved leg first, lower body using other leg. Repeat 10 times per set. Do 2 sets per session. Do 3-5 sessions per week.  Copyright  VHI. All rights reserved.   Step: Up, Anterior    Stand facing step. Place involved leg up. Raise body using top leg only. Step down backward, involved leg first, lower body using other leg. Repeat 10 times per set. Do 2 sets per session.  Copyright  VHI. All rights reserved.

## 2016-12-30 ENCOUNTER — Encounter: Payer: Medicare Other | Attending: General Surgery | Admitting: Skilled Nursing Facility1

## 2016-12-30 ENCOUNTER — Ambulatory Visit (HOSPITAL_COMMUNITY): Payer: Medicare Other | Admitting: Physical Therapy

## 2016-12-30 ENCOUNTER — Encounter: Payer: Self-pay | Admitting: Skilled Nursing Facility1

## 2016-12-30 DIAGNOSIS — R600 Localized edema: Secondary | ICD-10-CM

## 2016-12-30 DIAGNOSIS — M25561 Pain in right knee: Secondary | ICD-10-CM | POA: Diagnosis not present

## 2016-12-30 DIAGNOSIS — M25661 Stiffness of right knee, not elsewhere classified: Secondary | ICD-10-CM | POA: Diagnosis not present

## 2016-12-30 DIAGNOSIS — Z9884 Bariatric surgery status: Secondary | ICD-10-CM | POA: Insufficient documentation

## 2016-12-30 DIAGNOSIS — Z713 Dietary counseling and surveillance: Secondary | ICD-10-CM | POA: Diagnosis not present

## 2016-12-30 DIAGNOSIS — M6281 Muscle weakness (generalized): Secondary | ICD-10-CM | POA: Diagnosis not present

## 2016-12-30 NOTE — Therapy (Signed)
[facility] [place], [place], [address] Phone: [phone]   Fax:  [phone]  Physical Therapy Treatment  Patient Details  Name: [name] MRN: [id number] Date of Birth: [birth year] Referring Provider: Santiago Bumpers  Encounter Date: 12/30/2016      PT End of Session - 12/30/16 1423    Visit Number 5   Number of Visits 8   Date for PT Re-Evaluation 01/15/17   Authorization Type medicare   Authorization - Visit Number 5   Authorization - Number of Visits 8   PT Start Time 0623   PT Stop Time 1423   PT Time Calculation (min) 38 min   Activity Tolerance Patient tolerated treatment well;No increased pain   Behavior During Therapy WFL for tasks assessed/performed      Past Medical History:  Diagnosis Date  . Allergy    takes Claritin daily  . Arthritis   . Complication of anesthesia   . Constipation    takes Senokot daily  . Depression    takes Lexapro daily  . Family history of adverse reaction to anesthesia    sister has a hard time waking up  . GERD (gastroesophageal reflux disease)   . History of blood clots 1979   after C/S clot went to lung  . History of bronchitis    3 yrs ago  . History of migraine   . Hypothyroidism    takes Synthroid daily  . Joint pain   . Joint swelling   . Personal history of colonic polyps    benign  . PONV (postoperative nausea and vomiting)   . Rheumatic fever 1958   hospitalized for three months  . Urinary leakage    takes Ditropan daily  . Vitamin D deficiency    takes Vit D daily    Past Surgical History:  Procedure Laterality Date  . APPENDECTOMY     incidental at time of C-section  . BACK SURGERY  1986  . cataract surgery Bilateral   . CESAREAN SECTION  1979  . CHOLECYSTECTOMY  1982  . COLONOSCOPY  2004   Dr. Imogene Burn polyps, left sided diverticula. Path unavailable.   . COLONOSCOPY  02/12/2011   Procedure: COLONOSCOPY;  Surgeon: Daneil Dolin, MD;  [facility];  Service: Endoscopy;  Laterality: N/A;  . ESOPHAGOGASTRODUODENOSCOPY  02/12/2011   Procedure: ESOPHAGOGASTRODUODENOSCOPY (EGD);  Surgeon: Daneil Dolin, MD;  [facility];  Service: Endoscopy;  Laterality: N/A;  . GASTRIC ROUX-EN-Y N/A 06/19/2015   Procedure: LAPAROSCOPIC ROUX-EN-Y GASTRIC BYPASS WITH UPPER ENDOSCOPY;  Surgeon: Excell Seltzer, MD;  [facility];  Service: General;  Laterality: N/A;  . [place]   left  . TOTAL KNEE ARTHROPLASTY  10/2009   left  . TOTAL KNEE ARTHROPLASTY Right 11/25/2016   Procedure: RIGHT TOTAL KNEE ARTHROPLASTY;  Surgeon: Mcarthur Rossetti, MD;  Location: [facility];  Service: Orthopedics;  Laterality: Right;  . UPPER GI ENDOSCOPY  06/19/2015   Procedure: UPPER GI ENDOSCOPY;  Surgeon: Excell Seltzer, MD;  [facility];  Service: General;;    There were no vitals filed for this visit.      Subjective Assessment - 12/30/16 1345    Subjective Ms. Rockhold states that steps are easier for her now; just about everything is getting easier.    Pertinent History TKR left    How long can you sit comfortably? Able to sit for over 90 minutes  How long can you stand comfortably? pt hasn't tried to stand; but feels that she could stand for 30 minutes    How long can you walk comfortably? able to walk for over 30 minutes now.    Patient Stated Goals walk without cane, get back silver sneakers back to bowling    Currently in Pain? No/denies   Pain Onset 1 to 4 weeks ago                         [facility] Adult PT Treatment/Exercise - 12/30/16 0001      Knee/Hip Exercises: Stretches   Knee: Self-Stretch Limitations Knee drives on 12 inche stool   Other Knee/Hip Stretches slant board stretch x 30"      Knee/Hip Exercises: Standing   Heel Raises 15 reps   Knee Flexion Right;10 reps;Limitations   Knee Flexion Limitations 3#   Lateral Step Up Right;15 reps;Hand Hold: 1;Step  Height: 6"   Forward Step Up Right;15 reps;Hand Hold: 1;Step Height: 6"   Step Down Right;15 reps;Step Height: 6"   Functional Squat 15 reps   Functional Squat Limitations combo with heel raise    Stairs 2 RT    SLS with Vectors 5 X 5" holds Rt LE   Other Standing Knee Exercises Tandem stance 3x 30" on foam  with head turns      Knee/Hip Exercises: Seated   Sit to Sand 15 reps                  PT Short Term Goals - 12/30/16 1425      PT SHORT TERM GOAL #1   Title Pt to be walking without an assistive device in her home   Time 2   Period Weeks   Status Achieved     PT SHORT TERM GOAL #2   Title Pt strength in bilateral hip extensors to be able to come sit to stand from lower height ie car with UE support with ease.   Time 2   Period Weeks   Status On-going     PT SHORT TERM GOAL #3   Title PT ROM in Rt knee to improve to 115 to allow pt to ride comfortably in a car for 45 minutes.    Time 2   Period Weeks   Status Achieved           PT Long Term Goals - 12/30/16 1426      PT LONG TERM GOAL #1   Title Pt to be walking without an assistive device outside.    Time 4   Period Weeks   Status Achieved     PT LONG TERM GOAL #2   Title Pt bilateral hip extensors strength increased one grade to allow pt to come from sit to stand with no UE assist.    Time 4   Period Weeks   Status Achieved     PT LONG TERM GOAL #3   Title Pt right knee ROM and hip extension strength to be increased to allow pt to squat to pick items off the ground using proper body mechanics    Time 4   Period Weeks   Status On-going     PT LONG TERM GOAL #4   Title Pt pain to be at a 0/10 to allow pt to feel comfortable going bace to silver sneakers.    Time 4   Period Weeks   Status On-going  Plan - 12/30/16 1424    Clinical Impression Statement Added steps, sit to stands and head turns with tandem gait to progress strength and balance.  Pt continues to  improve in functional balance.    Rehab Potential Good   PT Frequency 2x / week   PT Duration 4 weeks   PT Treatment/Interventions ADLs/Self Care Home Management;Therapeutic activities;Therapeutic exercise;Patient/family education;Manual techniques;Passive range of motion   PT Next Visit Plan Begin lunging next treatment.       Patient will benefit from skilled therapeutic intervention in order to improve the following deficits and impairments:  Abnormal gait, Decreased activity tolerance, Decreased range of motion, Decreased strength, Difficulty walking, Increased edema, Pain  Visit Diagnosis: Stiffness of right knee, not elsewhere classified  Localized edema  Muscle weakness (generalized)  Acute pain of right knee     Problem List Patient Active Problem List   Diagnosis Date Noted  . Status post total right knee replacement 11/25/2016  . Unilateral primary osteoarthritis, right knee 10/22/2016  . Trigger thumb, left thumb 10/22/2016  . Morbid obesity ([place]) 06/19/2015  . GERD (gastroesophageal reflux disease) 01/20/2011  . Colon polyp 01/20/2011  . Hemorrhoids 01/20/2011    Rayetta Humphrey, PT CLT [phone] 12/30/2016, 2:26 PM  [facility] [address] [place], [place], [address] Phone: [phone]   Fax:  [phone]  Name: GENESSIS FLANARY MRN: [id number] Date of Birth: 1940/09/20

## 2016-12-30 NOTE — Progress Notes (Signed)
Post-Operative RYGB Surgery  Medical Nutrition Therapy:  Appt start time: 964 end time:  840  Primary concerns today: Post-operative Bariatric Surgery Nutrition Management. Pt states she had a knee replacement 5 weeks ago and is feeling good. Pt states she also had cataract surgery. Pt states she is doing very well with no issues.   Non scale victories: size 16 pants, working in yard and house without getting winded, getting up off the ground/floor  Surgery date: 06/19/15 Surgery type: RYGB Start weight at [facility]: 281.5 lbs on 08/28/14 Weight today: 177.4 lbs Weight change: 2 lbs Total weight loss: 104 lbs Weight loss goal: 170-180 lbs  TANITA  BODY COMP RESULTS  05/21/15 07/03/15 08/15/15 10/03/15 01/02/16 06/30/16 12/30/2016   BMI (kg/m^2) 38.6 36.9 34.1 32.2 29.9 27.3 27   Fat Mass (lbs) 138.5 127.0 116 104 93.8 77 76.2   Fat Free Mass (lbs) 115.5 116.0 108.5 108 103 102.4 101.2   Total Body Water (lbs) 84.5 85.0 79.5 79 72.8 71.8 70.8    Preferred Learning Style:   No preference indicated   Learning Readiness:   Ready  24-hr recall:  Drinks 1 Premier protein shake per day  B (AM): Light and Fit Greek yogurt with crunchies (12g) Snk (AM):  Cheese and nuts (7-10g) L (PM): salad with 3 oz grilled chicken and cheese or 6-inch sub on wheat (21g) Snk (PM): cheese and nuts (7-10g) D (PM): salad with [place] (21g) Snk (PM): sometimes Archivist clear or Lean shake (20-30g)  Fluid intake: 3 Powerade zeros (60 oz) + protein shake (11oz)  Estimated total protein intake: 88-100g/day  Medications: no longer on cholesterol, reflux, or HTN medication Supplementation: 2 "regular Women's multivitamins," Vitamin D and 3 Calcium and b12 and vitamin C  Using straws: no Drinking while eating: no Hair loss: noticed some shedding, taking hair skin and nails supplement Carbonated beverages: none N/V/D/C: constipation resolving, takes fiber pill every night Dumping syndrome:  none Having you been chewing well: yes Chewing/swallowing difficulties: no Changes in vision: no Changes to mood/headaches: no Hair loss/Cahnges to skin/Changes to nails: no Any difficulty focusing or concentrating: no Sweating: no Dizziness/Lightheaded: no Palpitations: no  Carbonated beverages: no  Recent physical activity:  Biking for 30 minutes every night (5,000 steps per day); plans to start back to the gym  Progress Towards Goal(s):  In progress.  Handouts given during visit include:  none   Nutritional Diagnosis:  [place]-3.3 Overweight/obesity related to past poor dietary habits and physical inactivity as evidenced by patient w/ recent RYGB surgery following dietary guidelines for continued weight loss.  Intervention:  Nutrition counseling provided.  Teaching Method Utilized:  Visual Auditory Hands on  Barriers to learning/adherence to lifestyle change: none  Demonstrated degree of understanding via:  Teach Back   Monitoring/Evaluation:  Dietary intake, exercise, and body weight. Follow up in 6 months for 1.5 year post-op visit.

## 2017-01-01 ENCOUNTER — Ambulatory Visit (HOSPITAL_COMMUNITY): Payer: Medicare Other | Admitting: Physical Therapy

## 2017-01-01 DIAGNOSIS — R600 Localized edema: Secondary | ICD-10-CM

## 2017-01-01 DIAGNOSIS — M25661 Stiffness of right knee, not elsewhere classified: Secondary | ICD-10-CM | POA: Diagnosis not present

## 2017-01-01 DIAGNOSIS — M6281 Muscle weakness (generalized): Secondary | ICD-10-CM

## 2017-01-01 DIAGNOSIS — M25561 Pain in right knee: Secondary | ICD-10-CM | POA: Diagnosis not present

## 2017-01-01 NOTE — Therapy (Addendum)
[place], [place], [address] Phone: [phone]   Fax:  [phone]  Physical Therapy Treatment  Patient Details  Name: [name] MRN: [id number] Date of Birth: June 04, 1941 Referring Provider: Santiago Bumpers  Encounter Date: 01/01/2017      PT End of Session - 01/01/17 1157    Visit Number 6   Number of Visits 8   Date for PT Re-Evaluation 01/15/17   Authorization Type medicare   Authorization - Visit Number 6   Authorization - Number of Visits 8   PT Start Time 6389   PT Stop Time 1200   PT Time Calculation (min) 38 min   Activity Tolerance Patient tolerated treatment well;No increased pain   Behavior During Therapy WFL for tasks assessed/performed      Past Medical History:  Diagnosis Date  . Allergy    takes Claritin daily  . Arthritis   . Complication of anesthesia   . Constipation    takes Senokot daily  . Depression    takes Lexapro daily  . Family history of adverse reaction to anesthesia    sister has a hard time waking up  . GERD (gastroesophageal reflux disease)   . History of blood clots 1979   after C/S clot went to lung  . History of bronchitis    3 yrs ago  . History of migraine   . Hypothyroidism    takes Synthroid daily  . Joint pain   . Joint swelling   . Personal history of colonic polyps    benign  . PONV (postoperative nausea and vomiting)   . Rheumatic fever 1958   hospitalized for three months  . Urinary leakage    takes Ditropan daily  . Vitamin D deficiency    takes Vit D daily    Past Surgical History:  Procedure Laterality Date  . APPENDECTOMY     incidental at time of C-section  . BACK SURGERY  1986  . cataract surgery Bilateral   . CESAREAN SECTION  1979  . CHOLECYSTECTOMY  1982  . COLONOSCOPY  2004   Dr. Imogene Burn polyps, left sided diverticula. Path unavailable.   . COLONOSCOPY  02/12/2011   Procedure: COLONOSCOPY;  Surgeon: Daneil Dolin, MD;  [facility];  Service: Endoscopy;  Laterality: N/A;  . ESOPHAGOGASTRODUODENOSCOPY  02/12/2011   Procedure: ESOPHAGOGASTRODUODENOSCOPY (EGD);  Surgeon: Daneil Dolin, MD;  [facility];  Service: Endoscopy;  Laterality: N/A;  . GASTRIC ROUX-EN-Y N/A 06/19/2015   Procedure: LAPAROSCOPIC ROUX-EN-Y GASTRIC BYPASS WITH UPPER ENDOSCOPY;  Surgeon: Excell Seltzer, MD;  Location: [facility];  Service: General;  Laterality: N/A;  . Chance   left  . TOTAL KNEE ARTHROPLASTY  10/2009   left  . TOTAL KNEE ARTHROPLASTY Right 11/25/2016   Procedure: RIGHT TOTAL KNEE ARTHROPLASTY;  Surgeon: Mcarthur Rossetti, MD;  Location: [facility];  Service: Orthopedics;  Laterality: Right;  . UPPER GI ENDOSCOPY  06/19/2015   Procedure: UPPER GI ENDOSCOPY;  Surgeon: Excell Seltzer, MD;  Location: [facility];  Service: General;;    There were no vitals filed for this visit.      Subjective Assessment - 01/01/17 1124    Subjective Pt states no pain.  She is getting out and mowing her yard with a push mower.     Pertinent History TKR left    How long can you sit comfortably? Able to sit for over 90 minutes  How long can you stand comfortably? pt hasn't tried to stand; but feels that she could stand for 30 minutes    How long can you walk comfortably? able to walk for over 30 minutes now.    Patient Stated Goals walk without cane, get back silver sneakers back to bowling    Currently in Pain? No/denies   Pain Onset 1 to 4 weeks ago                         [facility] Adult PT Treatment/Exercise - 01/01/17 0001      Knee/Hip Exercises: Stretches   Active Hamstring Stretch Right;3 reps;30 seconds   Knee: Self-Stretch Limitations Knee drives on 12 inche stool   Other Knee/Hip Stretches slant board stretch x 30"      Knee/Hip Exercises: Standing   Heel Raises 15 reps   Heel Raises Limitations with functional squat    Knee Flexion Right;15 reps;Limitations    Knee Flexion Limitations 3#   Forward Lunges Both;10 reps   Lateral Step Up Right;15 reps;Hand Hold: 1;Step Height: 6"   Forward Step Up Right;15 reps;Hand Hold: 1;Step Height: 6"   Step Down Right;15 reps;Step Height: 6"   Functional Squat 15 reps   Functional Squat Limitations combo with heel raise    Rocker Board 2 minutes   SLS with Vectors 5 X10" holds Rt LE   Other Standing Knee Exercises Tandem stance 3x 30" on foam  with head turns      Knee/Hip Exercises: Seated   Sit to Sand 5 reps  5 with right in back in chair; higher level for 5 x on LT                   PT Short Term Goals - 12/30/16 1425      PT SHORT TERM GOAL #1   Title Pt to be walking without an assistive device in her home   Time 2   Period Weeks   Status Achieved     PT SHORT TERM GOAL #2   Title Pt strength in bilateral hip extensors to be able to come sit to stand from lower height ie car with UE support with ease.   Time 2   Period Weeks   Status Achieved      PT SHORT TERM GOAL #3   Title PT ROM in Rt knee to improve to 115 to allow pt to ride comfortably in a car for 45 minutes.    Time 2   Period Weeks   Status Achieved           PT Long Term Goals - 12/30/16 1426      PT LONG TERM GOAL #1   Title Pt to be walking without an assistive device outside.    Time 4   Period Weeks   Status Achieved     PT LONG TERM GOAL #2   Title Pt bilateral hip extensors strength increased one grade to allow pt to come from sit to stand with no UE assist.    Time 4   Period Weeks   Status Achieved     PT LONG TERM GOAL #3   Title Pt right knee ROM and hip extension strength to be increased to allow pt to squat to pick items off the ground using proper body mechanics    Time 4   Period Weeks   Status Achieved      PT LONG TERM  GOAL #4   Title Pt pain to be at a 0/10 to allow pt to feel comfortable going bace to silver sneakers.    Time 4   Period Weeks   Status Achieved                 Plan - 01/01/17 1158    Clinical Impression Statement Pt continues to be challenged with balance activity.  ROM and strength continue to progress.  Focus on balance for the next two sessions.    Rehab Potential Good   PT Frequency 2x / week   PT Duration 4 weeks   PT Treatment/Interventions ADLs/Self Care Home Management;Therapeutic activities;Therapeutic exercise;Patient/family education;Manual techniques;Passive range of motion   PT Next Visit Plan focus on balance for the next two sessions.       Patient will benefit from skilled therapeutic intervention in order to improve the following deficits and impairments:  Abnormal gait, Decreased activity tolerance, Decreased range of motion, Decreased strength, Difficulty walking, Increased edema, Pain  Visit Diagnosis: Stiffness of right knee, not elsewhere classified  Localized edema  Muscle weakness (generalized)     Problem List Patient Active Problem List   Diagnosis Date Noted  . Status post total right knee replacement 11/25/2016  . Unilateral primary osteoarthritis, right knee 10/22/2016  . Trigger thumb, left thumb 10/22/2016  . Morbid obesity ([place]) 06/19/2015  . GERD (gastroesophageal reflux disease) 01/20/2011  . Colon polyp 01/20/2011  . Hemorrhoids 01/20/2011    Rayetta Humphrey, PT CLT [phone] 01/01/2017, 12:00 PM  [facility] [address] [place], [place], [address] Phone: [phone]   Fax:  [phone]  Name: [name] MRN: [id number] Date of Birth: 1941/03/17  01/06/2017   PHYSICAL THERAPY DISCHARGE SUMMARY   Visits from Start of Care: 6  Current functional level related to goals / functional outcomes: See above   Remaining deficits: balance   Education / Equipment: HEP  Plan: Patient agrees to discharge.  Patient goals were met. Patient is being discharged due to being pleased with the current functional level.  ?????         Pt missed appointment.  States she did not realize that she was going to be watching her grandson over the summer. She is happy with her functional status and has cancelled all future visits.   Rayetta Humphrey, [place] CLT [phone]

## 2017-01-06 ENCOUNTER — Ambulatory Visit (HOSPITAL_COMMUNITY): Payer: Medicare Other | Admitting: Physical Therapy

## 2017-01-06 ENCOUNTER — Telehealth (HOSPITAL_COMMUNITY): Payer: Self-pay | Admitting: Physical Therapy

## 2017-01-06 ENCOUNTER — Telehealth (HOSPITAL_COMMUNITY): Payer: Self-pay | Admitting: Internal Medicine

## 2017-01-06 NOTE — Telephone Encounter (Signed)
PT needed to take care of grandson over the summer.  She requests all future visits to be cancelled as she is doing well.  Rayetta Humphrey, [place] CLT [phone]

## 2017-01-06 NOTE — Telephone Encounter (Signed)
01/06/17 pt left a message to cx this weeks appts and I forgot to document on the day she called

## 2017-01-08 ENCOUNTER — Ambulatory Visit (HOSPITAL_COMMUNITY): Payer: Medicare Other | Admitting: Physical Therapy

## 2017-01-08 ENCOUNTER — Ambulatory Visit (INDEPENDENT_AMBULATORY_CARE_PROVIDER_SITE_OTHER): Payer: Medicare Other | Admitting: Orthopaedic Surgery

## 2017-01-08 DIAGNOSIS — Z96651 Presence of right artificial knee joint: Secondary | ICD-10-CM

## 2017-01-08 NOTE — Progress Notes (Signed)
The patient is 6 weeks status post a right total knee arthroplasty. She says she is doing great. She underwent a few therapy visits because the graduated her quickly. She told me she mowed the lawn in 4 weeks postoperative. She does not use an assistive device at all and has no problems she says going up and down stairs.  On examination other than some slight warmth and a mild effusion of her right knee her range of motion to entirely full. The knee feels ligamentously stable. She has excellent quad strength as well.  This point I really don't need see her back now for 6 months since she is doing so well. We'll have an AP and lateral of her right knee at that visit. If she has any problem prior to that she'll let us know.

## 2017-02-17 DIAGNOSIS — L237 Allergic contact dermatitis due to plants, except food: Secondary | ICD-10-CM | POA: Diagnosis not present

## 2017-02-17 DIAGNOSIS — Z6826 Body mass index (BMI) 26.0-26.9, adult: Secondary | ICD-10-CM | POA: Diagnosis not present

## 2017-02-18 DIAGNOSIS — L904 Acrodermatitis chronica atrophicans: Secondary | ICD-10-CM | POA: Diagnosis not present

## 2017-02-18 DIAGNOSIS — Z6828 Body mass index (BMI) 28.0-28.9, adult: Secondary | ICD-10-CM | POA: Diagnosis not present

## 2017-02-18 DIAGNOSIS — Z1231 Encounter for screening mammogram for malignant neoplasm of breast: Secondary | ICD-10-CM | POA: Diagnosis not present

## 2017-02-18 DIAGNOSIS — N762 Acute vulvitis: Secondary | ICD-10-CM | POA: Diagnosis not present

## 2017-02-18 DIAGNOSIS — Z124 Encounter for screening for malignant neoplasm of cervix: Secondary | ICD-10-CM | POA: Diagnosis not present

## 2017-03-18 DIAGNOSIS — R7301 Impaired fasting glucose: Secondary | ICD-10-CM | POA: Diagnosis not present

## 2017-03-18 DIAGNOSIS — E039 Hypothyroidism, unspecified: Secondary | ICD-10-CM | POA: Diagnosis not present

## 2017-03-18 DIAGNOSIS — I10 Essential (primary) hypertension: Secondary | ICD-10-CM | POA: Diagnosis not present

## 2017-03-20 DIAGNOSIS — R945 Abnormal results of liver function studies: Secondary | ICD-10-CM | POA: Diagnosis not present

## 2017-03-20 DIAGNOSIS — E039 Hypothyroidism, unspecified: Secondary | ICD-10-CM | POA: Diagnosis not present

## 2017-03-20 DIAGNOSIS — R32 Unspecified urinary incontinence: Secondary | ICD-10-CM | POA: Diagnosis not present

## 2017-03-20 DIAGNOSIS — Z6826 Body mass index (BMI) 26.0-26.9, adult: Secondary | ICD-10-CM | POA: Diagnosis not present

## 2017-03-20 DIAGNOSIS — E6609 Other obesity due to excess calories: Secondary | ICD-10-CM | POA: Diagnosis not present

## 2017-03-20 DIAGNOSIS — Z0001 Encounter for general adult medical examination with abnormal findings: Secondary | ICD-10-CM | POA: Diagnosis not present

## 2017-03-20 DIAGNOSIS — E782 Mixed hyperlipidemia: Secondary | ICD-10-CM | POA: Diagnosis not present

## 2017-03-20 DIAGNOSIS — I10 Essential (primary) hypertension: Secondary | ICD-10-CM | POA: Diagnosis not present

## 2017-05-11 DIAGNOSIS — Z23 Encounter for immunization: Secondary | ICD-10-CM | POA: Diagnosis not present

## 2017-07-13 ENCOUNTER — Ambulatory Visit (INDEPENDENT_AMBULATORY_CARE_PROVIDER_SITE_OTHER): Payer: Medicare Other | Admitting: Orthopaedic Surgery

## 2017-07-16 DIAGNOSIS — E559 Vitamin D deficiency, unspecified: Secondary | ICD-10-CM | POA: Diagnosis not present

## 2017-07-16 DIAGNOSIS — Z9884 Bariatric surgery status: Secondary | ICD-10-CM | POA: Diagnosis not present

## 2017-07-16 DIAGNOSIS — E538 Deficiency of other specified B group vitamins: Secondary | ICD-10-CM | POA: Diagnosis not present

## 2017-07-16 DIAGNOSIS — R5383 Other fatigue: Secondary | ICD-10-CM | POA: Diagnosis not present

## 2017-07-16 DIAGNOSIS — K912 Postsurgical malabsorption, not elsewhere classified: Secondary | ICD-10-CM | POA: Diagnosis not present

## 2017-08-25 ENCOUNTER — Ambulatory Visit (INDEPENDENT_AMBULATORY_CARE_PROVIDER_SITE_OTHER): Payer: Medicare Other

## 2017-08-25 ENCOUNTER — Encounter (INDEPENDENT_AMBULATORY_CARE_PROVIDER_SITE_OTHER): Payer: Self-pay | Admitting: Orthopaedic Surgery

## 2017-08-25 ENCOUNTER — Ambulatory Visit (INDEPENDENT_AMBULATORY_CARE_PROVIDER_SITE_OTHER): Payer: Medicare Other | Admitting: Orthopaedic Surgery

## 2017-08-25 DIAGNOSIS — M25561 Pain in right knee: Secondary | ICD-10-CM | POA: Diagnosis not present

## 2017-08-25 DIAGNOSIS — Z96651 Presence of right artificial knee joint: Secondary | ICD-10-CM

## 2017-08-25 NOTE — Progress Notes (Signed)
The patient is a very pleasant 77 year old who is now 9 months status post a right total knee arthroplasty.  She says she is doing well and has no limp.  Says her range of motion and strength are full she is having no issues with it at all.  She is 8 years out from a left total knee arthroplasty.  On exam both knees have full range of motion of the ligaments are stable without any issues at all.  Incisions have healed nicely.  X-rays also confirm well-seated implants with no complicating features.  This point she will follow-up as needed.  We talked about things that need to bring her back for her knees or anything else if needed.  All questions concerns were answered and addressed.

## 2017-09-15 DIAGNOSIS — E039 Hypothyroidism, unspecified: Secondary | ICD-10-CM | POA: Diagnosis not present

## 2017-09-15 DIAGNOSIS — I10 Essential (primary) hypertension: Secondary | ICD-10-CM | POA: Diagnosis not present

## 2017-09-15 DIAGNOSIS — R945 Abnormal results of liver function studies: Secondary | ICD-10-CM | POA: Diagnosis not present

## 2017-09-15 DIAGNOSIS — R7301 Impaired fasting glucose: Secondary | ICD-10-CM | POA: Diagnosis not present

## 2017-09-15 DIAGNOSIS — E782 Mixed hyperlipidemia: Secondary | ICD-10-CM | POA: Diagnosis not present

## 2017-09-17 DIAGNOSIS — E039 Hypothyroidism, unspecified: Secondary | ICD-10-CM | POA: Diagnosis not present

## 2017-09-17 DIAGNOSIS — Z6829 Body mass index (BMI) 29.0-29.9, adult: Secondary | ICD-10-CM | POA: Diagnosis not present

## 2017-09-17 DIAGNOSIS — R945 Abnormal results of liver function studies: Secondary | ICD-10-CM | POA: Diagnosis not present

## 2017-10-05 DIAGNOSIS — F339 Major depressive disorder, recurrent, unspecified: Secondary | ICD-10-CM | POA: Diagnosis not present

## 2017-10-05 DIAGNOSIS — E782 Mixed hyperlipidemia: Secondary | ICD-10-CM | POA: Diagnosis not present

## 2017-10-05 DIAGNOSIS — E6609 Other obesity due to excess calories: Secondary | ICD-10-CM | POA: Diagnosis not present

## 2017-10-05 DIAGNOSIS — I10 Essential (primary) hypertension: Secondary | ICD-10-CM | POA: Diagnosis not present

## 2017-10-05 DIAGNOSIS — R945 Abnormal results of liver function studies: Secondary | ICD-10-CM | POA: Diagnosis not present

## 2017-10-05 DIAGNOSIS — M722 Plantar fascial fibromatosis: Secondary | ICD-10-CM | POA: Diagnosis not present

## 2017-10-05 DIAGNOSIS — E039 Hypothyroidism, unspecified: Secondary | ICD-10-CM | POA: Diagnosis not present

## 2017-10-05 DIAGNOSIS — R32 Unspecified urinary incontinence: Secondary | ICD-10-CM | POA: Diagnosis not present

## 2017-10-05 DIAGNOSIS — R7301 Impaired fasting glucose: Secondary | ICD-10-CM | POA: Diagnosis not present

## 2017-10-05 DIAGNOSIS — Z6829 Body mass index (BMI) 29.0-29.9, adult: Secondary | ICD-10-CM | POA: Diagnosis not present

## 2017-10-05 DIAGNOSIS — K219 Gastro-esophageal reflux disease without esophagitis: Secondary | ICD-10-CM | POA: Diagnosis not present

## 2017-10-05 DIAGNOSIS — F411 Generalized anxiety disorder: Secondary | ICD-10-CM | POA: Diagnosis not present

## 2018-01-20 DIAGNOSIS — R7301 Impaired fasting glucose: Secondary | ICD-10-CM | POA: Diagnosis not present

## 2018-01-20 DIAGNOSIS — I10 Essential (primary) hypertension: Secondary | ICD-10-CM | POA: Diagnosis not present

## 2018-01-20 DIAGNOSIS — E039 Hypothyroidism, unspecified: Secondary | ICD-10-CM | POA: Diagnosis not present

## 2018-01-20 DIAGNOSIS — E6609 Other obesity due to excess calories: Secondary | ICD-10-CM | POA: Diagnosis not present

## 2018-01-20 DIAGNOSIS — Z6830 Body mass index (BMI) 30.0-30.9, adult: Secondary | ICD-10-CM | POA: Diagnosis not present

## 2018-01-20 DIAGNOSIS — F411 Generalized anxiety disorder: Secondary | ICD-10-CM | POA: Diagnosis not present

## 2018-01-20 DIAGNOSIS — E782 Mixed hyperlipidemia: Secondary | ICD-10-CM | POA: Diagnosis not present

## 2018-01-20 DIAGNOSIS — K219 Gastro-esophageal reflux disease without esophagitis: Secondary | ICD-10-CM | POA: Diagnosis not present

## 2018-01-20 DIAGNOSIS — B373 Candidiasis of vulva and vagina: Secondary | ICD-10-CM | POA: Diagnosis not present

## 2018-01-20 DIAGNOSIS — F339 Major depressive disorder, recurrent, unspecified: Secondary | ICD-10-CM | POA: Diagnosis not present

## 2018-01-20 DIAGNOSIS — R945 Abnormal results of liver function studies: Secondary | ICD-10-CM | POA: Diagnosis not present

## 2018-01-20 DIAGNOSIS — R32 Unspecified urinary incontinence: Secondary | ICD-10-CM | POA: Diagnosis not present

## 2018-02-23 DIAGNOSIS — Z01419 Encounter for gynecological examination (general) (routine) without abnormal findings: Secondary | ICD-10-CM | POA: Diagnosis not present

## 2018-02-23 DIAGNOSIS — Z6830 Body mass index (BMI) 30.0-30.9, adult: Secondary | ICD-10-CM | POA: Diagnosis not present

## 2018-02-23 DIAGNOSIS — N760 Acute vaginitis: Secondary | ICD-10-CM | POA: Diagnosis not present

## 2018-02-23 DIAGNOSIS — Z1231 Encounter for screening mammogram for malignant neoplasm of breast: Secondary | ICD-10-CM | POA: Diagnosis not present

## 2018-02-24 ENCOUNTER — Ambulatory Visit (INDEPENDENT_AMBULATORY_CARE_PROVIDER_SITE_OTHER): Payer: Self-pay

## 2018-02-24 ENCOUNTER — Encounter (INDEPENDENT_AMBULATORY_CARE_PROVIDER_SITE_OTHER): Payer: Self-pay | Admitting: Physician Assistant

## 2018-02-24 ENCOUNTER — Ambulatory Visit (INDEPENDENT_AMBULATORY_CARE_PROVIDER_SITE_OTHER): Payer: Medicare Other | Admitting: Physician Assistant

## 2018-02-24 DIAGNOSIS — M25512 Pain in left shoulder: Secondary | ICD-10-CM | POA: Diagnosis not present

## 2018-02-24 DIAGNOSIS — M25511 Pain in right shoulder: Secondary | ICD-10-CM

## 2018-02-24 MED ORDER — LIDOCAINE HCL 1 % IJ SOLN
0.5000 mL | INTRAMUSCULAR | Status: AC | PRN
  Administered 2018-02-24: .5 mL

## 2018-02-24 MED ORDER — METHYLPREDNISOLONE ACETATE 40 MG/ML IJ SUSP
40.0000 mg | INTRAMUSCULAR | Status: AC | PRN
  Administered 2018-02-24: 40 mg via INTRA_ARTICULAR

## 2018-02-24 NOTE — Progress Notes (Signed)
   Procedure Note  Patient: [name]             Date of Birth: [birth year]           MRN: [id number]             Visit Date: 02/24/2018 HPI Mrs. Demorest comes in today for bilateral shoulder pain.  Pains been present for the past month.  Right greater than left.  No numbness tingling down either arm.  Is a s well-known to Dr. Ninfa Linden service his pain predominately about the shoulder girdles.  Some pain in her neck times.  Pain does awaken him.  Tried Tylenol PM helps the pain slightly.  She is involved in a workout class using weights overhead.  No known injury.    Review of systems: See HPI otherwise negative  Physical exam: General well-developed well-nourished female no acute distress mood affect appropriate.  Psych alert and oriented x3. Bilateral shoulders she has positive impingement bilaterally.  5 out of 5 strength with external and internal rotation against resistance.  Slight weakness with liftoff on the right negative on the left.  Empty can test is negative bilaterally.  Crossover test negative bilaterally  Radiographs 3 views bilateral shoulders: No acute fractures.  Bilateral shoulders well located.  Subacromial space well maintained on the Y views.  Glenohumeral joint appears well-maintained.  Moderate acromioclavicular joint degenerative changes bilaterally.  Procedures: Visit Diagnoses: Right shoulder pain, unspecified chronicity - Plan: XR Shoulder Right  Left shoulder pain, unspecified chronicity - Plan: XR Shoulder Left  Large Joint Inj: bilateral subacromial bursa on 02/24/2018 2:02 PM Indications: pain Details: 22 G 1.5 in needle, superior approach  Arthrogram: No  Medications (Right): 0.5 mL lidocaine 1 %; 40 mg methylPREDNISolone acetate 40 MG/ML Medications (Left): 0.5 mL lidocaine 1 %; 40 mg methylPREDNISolone acetate 40 MG/ML Outcome: tolerated well, no immediate complications Procedure, treatment alternatives, risks and benefits explained, specific risks  discussed. Consent was given by the patient. Immediately prior to procedure a time out was called to verify the correct patient, procedure, equipment, support staff and site/side marked as required. Patient was prepped and draped in the usual sterile fashion.    Plan: She will work on wall crawls, Forward flexion exercises: Pendulum Codman exercises.  See her back in 2 weeks check her progress lack of.  Questions encouraged and answered.

## 2018-03-10 ENCOUNTER — Other Ambulatory Visit (INDEPENDENT_AMBULATORY_CARE_PROVIDER_SITE_OTHER): Payer: Self-pay

## 2018-03-10 ENCOUNTER — Encounter (INDEPENDENT_AMBULATORY_CARE_PROVIDER_SITE_OTHER): Payer: Self-pay | Admitting: Physician Assistant

## 2018-03-10 ENCOUNTER — Ambulatory Visit (INDEPENDENT_AMBULATORY_CARE_PROVIDER_SITE_OTHER): Payer: Medicare Other | Admitting: Physician Assistant

## 2018-03-10 DIAGNOSIS — M25511 Pain in right shoulder: Principal | ICD-10-CM

## 2018-03-10 DIAGNOSIS — G8929 Other chronic pain: Secondary | ICD-10-CM

## 2018-03-10 NOTE — Progress Notes (Signed)
HPI: Mrs. Pensyl returns today for bilateral shoulder pain.  She is 2 weeks status post bilateral shoulder subacromial injections.  She states shot really helped with her left shoulder still having pain in the right arm and shoulder.  She has been doing a home exercise program as shown.  Physical exam: Bilateral shoulder she has 5 out of 5 strength against resistance and empty can test negative bilaterally.  Liftoff test negative bilaterally.  She has a positive impingement sign on the right negative on the left.  Impression: Left shoulder pain improved Right shoulder impingement  Plan: At this point time we will send her to formal physical therapy for home exercise program strengthening modalities to the right shoulder.  She will follow-up with [place] in 1 month check progress lack of.  She reports she does not want to go on any type of medication for her shoulder pain as she has had elevated liver enzymes with at her last lab draw.  Therefore I feel is very conservative treatment of physical therapy is appropriate.  However she continues to have significant right shoulder pain she may require an MRI to further evaluate her shoulder for possible rotator cuff tear.

## 2018-03-12 DIAGNOSIS — I10 Essential (primary) hypertension: Secondary | ICD-10-CM | POA: Diagnosis not present

## 2018-03-12 DIAGNOSIS — R945 Abnormal results of liver function studies: Secondary | ICD-10-CM | POA: Diagnosis not present

## 2018-03-12 DIAGNOSIS — R7301 Impaired fasting glucose: Secondary | ICD-10-CM | POA: Diagnosis not present

## 2018-03-12 DIAGNOSIS — E039 Hypothyroidism, unspecified: Secondary | ICD-10-CM | POA: Diagnosis not present

## 2018-03-12 DIAGNOSIS — E782 Mixed hyperlipidemia: Secondary | ICD-10-CM | POA: Diagnosis not present

## 2018-03-16 DIAGNOSIS — Z6830 Body mass index (BMI) 30.0-30.9, adult: Secondary | ICD-10-CM | POA: Diagnosis not present

## 2018-03-16 DIAGNOSIS — N3281 Overactive bladder: Secondary | ICD-10-CM | POA: Diagnosis not present

## 2018-03-16 DIAGNOSIS — F331 Major depressive disorder, recurrent, moderate: Secondary | ICD-10-CM | POA: Diagnosis not present

## 2018-03-16 DIAGNOSIS — Z0000 Encounter for general adult medical examination without abnormal findings: Secondary | ICD-10-CM | POA: Diagnosis not present

## 2018-03-16 DIAGNOSIS — E039 Hypothyroidism, unspecified: Secondary | ICD-10-CM | POA: Diagnosis not present

## 2018-03-16 DIAGNOSIS — R945 Abnormal results of liver function studies: Secondary | ICD-10-CM | POA: Diagnosis not present

## 2018-04-06 DIAGNOSIS — Z1212 Encounter for screening for malignant neoplasm of rectum: Secondary | ICD-10-CM | POA: Diagnosis not present

## 2018-04-06 DIAGNOSIS — Z1211 Encounter for screening for malignant neoplasm of colon: Secondary | ICD-10-CM | POA: Diagnosis not present

## 2018-04-07 ENCOUNTER — Ambulatory Visit (INDEPENDENT_AMBULATORY_CARE_PROVIDER_SITE_OTHER): Payer: Medicare Other | Admitting: Physician Assistant

## 2018-04-07 ENCOUNTER — Encounter (INDEPENDENT_AMBULATORY_CARE_PROVIDER_SITE_OTHER): Payer: Self-pay | Admitting: Physician Assistant

## 2018-04-07 DIAGNOSIS — M25512 Pain in left shoulder: Secondary | ICD-10-CM

## 2018-04-07 DIAGNOSIS — M25511 Pain in right shoulder: Secondary | ICD-10-CM | POA: Diagnosis not present

## 2018-04-07 NOTE — Progress Notes (Signed)
HPI: [name] returns today follow-up of bilateral shoulder pain.  She unfortunately did not get a physical therapy due to therapy in [place] collar 2 weeks after the referral was made and then another week for [place] therapy to call her once it was realized that she needed to be seen in the [place] office.  She states that the injection in the left shoulder helped.  Right shoulder pain is still 9 out of 10 pain.  She states she cannot lift her right arm up more than half way.  She denies any numbness tingling down either arm.  She taken no medications for this.  Physical exam: Left shoulder she has full forward flexion.  Negative impingement testing on the left.  She has 5 out of 5 strength with external and internal rotation against resistance bilateral shoulders.  Positive impingement testing on the right weakness right shoulder with liftoff test.  Right shoulder she has forward flexion to approximately 160 degrees passively and bring it 180 degrees.  Impression: Bilateral shoulder pain  Plan: We will send her to physical therapy to work on range of motion strength in bilateral shoulders.  Prescription was given for [facility] in [place] patient will contact them and set up therapy.  See her back in a month to check her progress lack of.  Questions encouraged and answered at length.

## 2018-04-08 DIAGNOSIS — M25612 Stiffness of left shoulder, not elsewhere classified: Secondary | ICD-10-CM | POA: Diagnosis not present

## 2018-04-08 DIAGNOSIS — M25611 Stiffness of right shoulder, not elsewhere classified: Secondary | ICD-10-CM | POA: Diagnosis not present

## 2018-04-08 DIAGNOSIS — M25512 Pain in left shoulder: Secondary | ICD-10-CM | POA: Diagnosis not present

## 2018-04-08 DIAGNOSIS — M25511 Pain in right shoulder: Secondary | ICD-10-CM | POA: Diagnosis not present

## 2018-04-14 DIAGNOSIS — M25511 Pain in right shoulder: Secondary | ICD-10-CM | POA: Diagnosis not present

## 2018-04-14 DIAGNOSIS — M25612 Stiffness of left shoulder, not elsewhere classified: Secondary | ICD-10-CM | POA: Diagnosis not present

## 2018-04-14 DIAGNOSIS — M25611 Stiffness of right shoulder, not elsewhere classified: Secondary | ICD-10-CM | POA: Diagnosis not present

## 2018-04-14 DIAGNOSIS — M25512 Pain in left shoulder: Secondary | ICD-10-CM | POA: Diagnosis not present

## 2018-04-15 DIAGNOSIS — M25612 Stiffness of left shoulder, not elsewhere classified: Secondary | ICD-10-CM | POA: Diagnosis not present

## 2018-04-15 DIAGNOSIS — M25611 Stiffness of right shoulder, not elsewhere classified: Secondary | ICD-10-CM | POA: Diagnosis not present

## 2018-04-15 DIAGNOSIS — M25511 Pain in right shoulder: Secondary | ICD-10-CM | POA: Diagnosis not present

## 2018-04-15 DIAGNOSIS — M25512 Pain in left shoulder: Secondary | ICD-10-CM | POA: Diagnosis not present

## 2018-04-16 DIAGNOSIS — M25511 Pain in right shoulder: Secondary | ICD-10-CM | POA: Diagnosis not present

## 2018-04-16 DIAGNOSIS — M25612 Stiffness of left shoulder, not elsewhere classified: Secondary | ICD-10-CM | POA: Diagnosis not present

## 2018-04-16 DIAGNOSIS — M25611 Stiffness of right shoulder, not elsewhere classified: Secondary | ICD-10-CM | POA: Diagnosis not present

## 2018-04-16 DIAGNOSIS — M25512 Pain in left shoulder: Secondary | ICD-10-CM | POA: Diagnosis not present

## 2018-04-19 DIAGNOSIS — M25511 Pain in right shoulder: Secondary | ICD-10-CM | POA: Diagnosis not present

## 2018-04-19 DIAGNOSIS — M25611 Stiffness of right shoulder, not elsewhere classified: Secondary | ICD-10-CM | POA: Diagnosis not present

## 2018-04-19 DIAGNOSIS — M25612 Stiffness of left shoulder, not elsewhere classified: Secondary | ICD-10-CM | POA: Diagnosis not present

## 2018-04-19 DIAGNOSIS — M25512 Pain in left shoulder: Secondary | ICD-10-CM | POA: Diagnosis not present

## 2018-04-22 DIAGNOSIS — M25512 Pain in left shoulder: Secondary | ICD-10-CM | POA: Diagnosis not present

## 2018-04-22 DIAGNOSIS — M25612 Stiffness of left shoulder, not elsewhere classified: Secondary | ICD-10-CM | POA: Diagnosis not present

## 2018-04-22 DIAGNOSIS — M25511 Pain in right shoulder: Secondary | ICD-10-CM | POA: Diagnosis not present

## 2018-04-22 DIAGNOSIS — M25611 Stiffness of right shoulder, not elsewhere classified: Secondary | ICD-10-CM | POA: Diagnosis not present

## 2018-04-23 DIAGNOSIS — M25611 Stiffness of right shoulder, not elsewhere classified: Secondary | ICD-10-CM | POA: Diagnosis not present

## 2018-04-23 DIAGNOSIS — M25612 Stiffness of left shoulder, not elsewhere classified: Secondary | ICD-10-CM | POA: Diagnosis not present

## 2018-04-23 DIAGNOSIS — M25511 Pain in right shoulder: Secondary | ICD-10-CM | POA: Diagnosis not present

## 2018-04-23 DIAGNOSIS — M25512 Pain in left shoulder: Secondary | ICD-10-CM | POA: Diagnosis not present

## 2018-04-28 DIAGNOSIS — M25611 Stiffness of right shoulder, not elsewhere classified: Secondary | ICD-10-CM | POA: Diagnosis not present

## 2018-04-28 DIAGNOSIS — M25512 Pain in left shoulder: Secondary | ICD-10-CM | POA: Diagnosis not present

## 2018-04-28 DIAGNOSIS — M25612 Stiffness of left shoulder, not elsewhere classified: Secondary | ICD-10-CM | POA: Diagnosis not present

## 2018-04-28 DIAGNOSIS — M25511 Pain in right shoulder: Secondary | ICD-10-CM | POA: Diagnosis not present

## 2018-04-29 DIAGNOSIS — M25511 Pain in right shoulder: Secondary | ICD-10-CM | POA: Diagnosis not present

## 2018-04-29 DIAGNOSIS — M25512 Pain in left shoulder: Secondary | ICD-10-CM | POA: Diagnosis not present

## 2018-04-29 DIAGNOSIS — M25611 Stiffness of right shoulder, not elsewhere classified: Secondary | ICD-10-CM | POA: Diagnosis not present

## 2018-04-29 DIAGNOSIS — M25612 Stiffness of left shoulder, not elsewhere classified: Secondary | ICD-10-CM | POA: Diagnosis not present

## 2018-05-03 DIAGNOSIS — M25512 Pain in left shoulder: Secondary | ICD-10-CM | POA: Diagnosis not present

## 2018-05-03 DIAGNOSIS — M25612 Stiffness of left shoulder, not elsewhere classified: Secondary | ICD-10-CM | POA: Diagnosis not present

## 2018-05-03 DIAGNOSIS — M25611 Stiffness of right shoulder, not elsewhere classified: Secondary | ICD-10-CM | POA: Diagnosis not present

## 2018-05-03 DIAGNOSIS — M25511 Pain in right shoulder: Secondary | ICD-10-CM | POA: Diagnosis not present

## 2018-05-05 DIAGNOSIS — M25612 Stiffness of left shoulder, not elsewhere classified: Secondary | ICD-10-CM | POA: Diagnosis not present

## 2018-05-05 DIAGNOSIS — M25512 Pain in left shoulder: Secondary | ICD-10-CM | POA: Diagnosis not present

## 2018-05-05 DIAGNOSIS — M25611 Stiffness of right shoulder, not elsewhere classified: Secondary | ICD-10-CM | POA: Diagnosis not present

## 2018-05-05 DIAGNOSIS — M25511 Pain in right shoulder: Secondary | ICD-10-CM | POA: Diagnosis not present

## 2018-05-12 ENCOUNTER — Ambulatory Visit (INDEPENDENT_AMBULATORY_CARE_PROVIDER_SITE_OTHER): Payer: Medicare Other | Admitting: Physician Assistant

## 2018-05-13 DIAGNOSIS — M25612 Stiffness of left shoulder, not elsewhere classified: Secondary | ICD-10-CM | POA: Diagnosis not present

## 2018-05-13 DIAGNOSIS — M25611 Stiffness of right shoulder, not elsewhere classified: Secondary | ICD-10-CM | POA: Diagnosis not present

## 2018-05-13 DIAGNOSIS — M25511 Pain in right shoulder: Secondary | ICD-10-CM | POA: Diagnosis not present

## 2018-05-13 DIAGNOSIS — M25512 Pain in left shoulder: Secondary | ICD-10-CM | POA: Diagnosis not present

## 2018-05-14 DIAGNOSIS — Z23 Encounter for immunization: Secondary | ICD-10-CM | POA: Diagnosis not present

## 2018-05-17 ENCOUNTER — Encounter (INDEPENDENT_AMBULATORY_CARE_PROVIDER_SITE_OTHER): Payer: Self-pay | Admitting: Physician Assistant

## 2018-05-17 ENCOUNTER — Ambulatory Visit (INDEPENDENT_AMBULATORY_CARE_PROVIDER_SITE_OTHER): Payer: Medicare Other | Admitting: Physician Assistant

## 2018-05-17 DIAGNOSIS — M25511 Pain in right shoulder: Secondary | ICD-10-CM

## 2018-05-17 DIAGNOSIS — M25512 Pain in left shoulder: Secondary | ICD-10-CM | POA: Diagnosis not present

## 2018-05-17 NOTE — Progress Notes (Signed)
HPI: [name] returns today follow-up of bilateral shoulder pain.  She is been going to physical therapy and feels that this is definitely helped.  She said no pain in the left shoulder.  Pain in the right shoulder is 2 out of 10 pain at worst.  Feels that her overall range of motion strength of both shoulders is improved.  Review of systems please see HPI  Physical exam: Bilateral shoulders full forward flexion.  Negative impingement.  Negative liftoff test bilaterally.  5 out of 5 strength with external and internal rotation against resistance bilateral shoulders.  Impression: Bilateral shoulder pain  Plan: Due to the fact that pain has improved in her overall range of motion and strength of improved with physical therapy recommend she continue with a home exercise program.  She will follow-up with [place] if her pain returns.  Questions were encouraged and answered.

## 2018-08-11 DIAGNOSIS — Z9884 Bariatric surgery status: Secondary | ICD-10-CM | POA: Diagnosis not present

## 2018-09-28 DIAGNOSIS — I10 Essential (primary) hypertension: Secondary | ICD-10-CM | POA: Diagnosis not present

## 2018-09-28 DIAGNOSIS — R7301 Impaired fasting glucose: Secondary | ICD-10-CM | POA: Diagnosis not present

## 2018-09-28 DIAGNOSIS — E039 Hypothyroidism, unspecified: Secondary | ICD-10-CM | POA: Diagnosis not present

## 2018-09-28 DIAGNOSIS — E782 Mixed hyperlipidemia: Secondary | ICD-10-CM | POA: Diagnosis not present

## 2018-10-04 DIAGNOSIS — F331 Major depressive disorder, recurrent, moderate: Secondary | ICD-10-CM | POA: Diagnosis not present

## 2018-10-04 DIAGNOSIS — N3281 Overactive bladder: Secondary | ICD-10-CM | POA: Diagnosis not present

## 2018-10-04 DIAGNOSIS — E039 Hypothyroidism, unspecified: Secondary | ICD-10-CM | POA: Diagnosis not present

## 2018-10-04 DIAGNOSIS — R945 Abnormal results of liver function studies: Secondary | ICD-10-CM | POA: Diagnosis not present

## 2018-10-04 DIAGNOSIS — E6609 Other obesity due to excess calories: Secondary | ICD-10-CM | POA: Diagnosis not present

## 2018-10-04 DIAGNOSIS — N182 Chronic kidney disease, stage 2 (mild): Secondary | ICD-10-CM | POA: Diagnosis not present

## 2018-10-04 DIAGNOSIS — R011 Cardiac murmur, unspecified: Secondary | ICD-10-CM | POA: Diagnosis not present

## 2018-10-07 ENCOUNTER — Other Ambulatory Visit (HOSPITAL_COMMUNITY): Payer: Self-pay | Admitting: Internal Medicine

## 2018-10-07 DIAGNOSIS — R0602 Shortness of breath: Secondary | ICD-10-CM

## 2018-10-11 ENCOUNTER — Ambulatory Visit (HOSPITAL_COMMUNITY)
Admission: RE | Admit: 2018-10-11 | Discharge: 2018-10-11 | Disposition: A | Discharge status: Home / Self Care | Payer: Medicare Other | Source: Ambulatory Visit | Attending: Internal Medicine | Admitting: Internal Medicine

## 2018-10-11 ENCOUNTER — Other Ambulatory Visit: Payer: Self-pay

## 2018-10-11 DIAGNOSIS — R0602 Shortness of breath: Secondary | ICD-10-CM

## 2018-10-11 NOTE — Progress Notes (Signed)
*  PRELIMINARY RESULTS* Echocardiogram 2D Echocardiogram has been performed.  Leavy Cella 10/11/2018, 11:18 AM

## 2019-02-25 DIAGNOSIS — Z01419 Encounter for gynecological examination (general) (routine) without abnormal findings: Secondary | ICD-10-CM | POA: Diagnosis not present

## 2019-02-25 DIAGNOSIS — N3281 Overactive bladder: Secondary | ICD-10-CM | POA: Insufficient documentation

## 2019-02-25 DIAGNOSIS — N904 Leukoplakia of vulva: Secondary | ICD-10-CM | POA: Insufficient documentation

## 2019-02-25 DIAGNOSIS — Z1231 Encounter for screening mammogram for malignant neoplasm of breast: Secondary | ICD-10-CM | POA: Diagnosis not present

## 2019-02-25 DIAGNOSIS — Z6831 Body mass index (BMI) 31.0-31.9, adult: Secondary | ICD-10-CM | POA: Diagnosis not present

## 2019-02-25 DIAGNOSIS — E079 Disorder of thyroid, unspecified: Secondary | ICD-10-CM | POA: Insufficient documentation

## 2019-03-04 ENCOUNTER — Other Ambulatory Visit: Payer: Self-pay

## 2019-04-05 DIAGNOSIS — E039 Hypothyroidism, unspecified: Secondary | ICD-10-CM | POA: Diagnosis not present

## 2019-04-05 DIAGNOSIS — I10 Essential (primary) hypertension: Secondary | ICD-10-CM | POA: Diagnosis not present

## 2019-04-05 DIAGNOSIS — R7301 Impaired fasting glucose: Secondary | ICD-10-CM | POA: Diagnosis not present

## 2019-04-05 DIAGNOSIS — E782 Mixed hyperlipidemia: Secondary | ICD-10-CM | POA: Diagnosis not present

## 2019-04-07 DIAGNOSIS — R21 Rash and other nonspecific skin eruption: Secondary | ICD-10-CM | POA: Diagnosis not present

## 2019-04-12 DIAGNOSIS — R011 Cardiac murmur, unspecified: Secondary | ICD-10-CM | POA: Diagnosis not present

## 2019-04-12 DIAGNOSIS — N3281 Overactive bladder: Secondary | ICD-10-CM | POA: Diagnosis not present

## 2019-04-12 DIAGNOSIS — E039 Hypothyroidism, unspecified: Secondary | ICD-10-CM | POA: Diagnosis not present

## 2019-04-12 DIAGNOSIS — Z0001 Encounter for general adult medical examination with abnormal findings: Secondary | ICD-10-CM | POA: Diagnosis not present

## 2019-04-12 DIAGNOSIS — Z6831 Body mass index (BMI) 31.0-31.9, adult: Secondary | ICD-10-CM | POA: Diagnosis not present

## 2019-04-12 DIAGNOSIS — E6609 Other obesity due to excess calories: Secondary | ICD-10-CM | POA: Diagnosis not present

## 2019-04-12 DIAGNOSIS — N182 Chronic kidney disease, stage 2 (mild): Secondary | ICD-10-CM | POA: Diagnosis not present

## 2019-04-12 DIAGNOSIS — F331 Major depressive disorder, recurrent, moderate: Secondary | ICD-10-CM | POA: Diagnosis not present

## 2019-05-26 DIAGNOSIS — Z23 Encounter for immunization: Secondary | ICD-10-CM | POA: Diagnosis not present

## 2019-06-14 DIAGNOSIS — R945 Abnormal results of liver function studies: Secondary | ICD-10-CM | POA: Diagnosis not present

## 2019-06-14 DIAGNOSIS — K219 Gastro-esophageal reflux disease without esophagitis: Secondary | ICD-10-CM | POA: Diagnosis not present

## 2019-06-14 DIAGNOSIS — R7301 Impaired fasting glucose: Secondary | ICD-10-CM | POA: Diagnosis not present

## 2019-06-14 DIAGNOSIS — I10 Essential (primary) hypertension: Secondary | ICD-10-CM | POA: Diagnosis not present

## 2019-06-14 DIAGNOSIS — F339 Major depressive disorder, recurrent, unspecified: Secondary | ICD-10-CM | POA: Diagnosis not present

## 2019-06-14 DIAGNOSIS — E782 Mixed hyperlipidemia: Secondary | ICD-10-CM | POA: Diagnosis not present

## 2019-06-14 DIAGNOSIS — F411 Generalized anxiety disorder: Secondary | ICD-10-CM | POA: Diagnosis not present

## 2019-06-14 DIAGNOSIS — E039 Hypothyroidism, unspecified: Secondary | ICD-10-CM | POA: Diagnosis not present

## 2019-07-22 ENCOUNTER — Other Ambulatory Visit: Payer: Self-pay

## 2019-07-22 ENCOUNTER — Ambulatory Visit: Payer: Medicare Other | Attending: Internal Medicine

## 2019-07-22 DIAGNOSIS — Z20822 Contact with and (suspected) exposure to covid-19: Secondary | ICD-10-CM

## 2019-07-23 LAB — NOVEL CORONAVIRUS, NAA: SARS-CoV-2, NAA: NOT DETECTED

## 2019-08-19 DIAGNOSIS — K219 Gastro-esophageal reflux disease without esophagitis: Secondary | ICD-10-CM | POA: Diagnosis not present

## 2019-08-19 DIAGNOSIS — F339 Major depressive disorder, recurrent, unspecified: Secondary | ICD-10-CM | POA: Diagnosis not present

## 2019-08-19 DIAGNOSIS — R7301 Impaired fasting glucose: Secondary | ICD-10-CM | POA: Diagnosis not present

## 2019-08-19 DIAGNOSIS — F411 Generalized anxiety disorder: Secondary | ICD-10-CM | POA: Diagnosis not present

## 2019-08-19 DIAGNOSIS — E039 Hypothyroidism, unspecified: Secondary | ICD-10-CM | POA: Diagnosis not present

## 2019-08-19 DIAGNOSIS — R945 Abnormal results of liver function studies: Secondary | ICD-10-CM | POA: Diagnosis not present

## 2019-08-19 DIAGNOSIS — I10 Essential (primary) hypertension: Secondary | ICD-10-CM | POA: Diagnosis not present

## 2019-08-19 DIAGNOSIS — E7849 Other hyperlipidemia: Secondary | ICD-10-CM | POA: Diagnosis not present

## 2019-08-21 ENCOUNTER — Ambulatory Visit: Payer: Medicare Other | Attending: Internal Medicine

## 2019-08-21 ENCOUNTER — Encounter: Payer: Self-pay | Admitting: *Deleted

## 2019-08-21 DIAGNOSIS — Z23 Encounter for immunization: Secondary | ICD-10-CM | POA: Diagnosis not present

## 2019-08-21 NOTE — Progress Notes (Unsigned)
   Covid-19 Vaccination Clinic  Name:  [name]    MRN: [id number] DOB: 25-Jan-1941  08/21/2019  [name] was observed post Covid-19 immunization for 15 minutes without incidence. She was provided with Vaccine Information Sheet and instruction to access the V-Safe system.   [name] was instructed to call 911 with any severe reactions post vaccine: Marland Kitchen Difficulty breathing  . Swelling of your face and throat  . A fast heartbeat  . A bad rash all over your body  . Dizziness and weakness

## 2019-09-11 ENCOUNTER — Ambulatory Visit: Payer: Medicare Other | Attending: Internal Medicine

## 2019-09-11 DIAGNOSIS — Z23 Encounter for immunization: Secondary | ICD-10-CM | POA: Insufficient documentation

## 2019-09-11 NOTE — Progress Notes (Signed)
   Covid-19 Vaccination Clinic  Name:  [name]    MRN: [id number] DOB: [birth year]  09/11/2019  [name] was observed post Covid-19 immunization for 15 minutes without incidence. She was provided with Vaccine Information Sheet and instruction to access the V-Safe system.   [name] was instructed to call 911 with any severe reactions post vaccine: Marland Kitchen Difficulty breathing  . Swelling of your face and throat  . A fast heartbeat  . A bad rash all over your body  . Dizziness and weakness    Immunizations Administered    Name Date Dose VIS Date Route   Pfizer COVID-19 Vaccine 09/11/2019 11:15 AM 0.3 mL 07/15/2019 Intramuscular   Manufacturer: [place]   Lot: [id number]   [place]: [id number]

## 2019-10-04 DIAGNOSIS — E039 Hypothyroidism, unspecified: Secondary | ICD-10-CM | POA: Diagnosis not present

## 2019-10-04 DIAGNOSIS — R32 Unspecified urinary incontinence: Secondary | ICD-10-CM | POA: Diagnosis not present

## 2019-10-04 DIAGNOSIS — E6609 Other obesity due to excess calories: Secondary | ICD-10-CM | POA: Diagnosis not present

## 2019-10-04 DIAGNOSIS — R21 Rash and other nonspecific skin eruption: Secondary | ICD-10-CM | POA: Diagnosis not present

## 2019-10-04 DIAGNOSIS — R7301 Impaired fasting glucose: Secondary | ICD-10-CM | POA: Diagnosis not present

## 2019-10-04 DIAGNOSIS — F411 Generalized anxiety disorder: Secondary | ICD-10-CM | POA: Diagnosis not present

## 2019-10-04 DIAGNOSIS — R945 Abnormal results of liver function studies: Secondary | ICD-10-CM | POA: Diagnosis not present

## 2019-10-04 DIAGNOSIS — I10 Essential (primary) hypertension: Secondary | ICD-10-CM | POA: Diagnosis not present

## 2019-10-04 DIAGNOSIS — E782 Mixed hyperlipidemia: Secondary | ICD-10-CM | POA: Diagnosis not present

## 2019-10-04 DIAGNOSIS — E7849 Other hyperlipidemia: Secondary | ICD-10-CM | POA: Diagnosis not present

## 2019-10-04 DIAGNOSIS — K219 Gastro-esophageal reflux disease without esophagitis: Secondary | ICD-10-CM | POA: Diagnosis not present

## 2019-10-04 DIAGNOSIS — F339 Major depressive disorder, recurrent, unspecified: Secondary | ICD-10-CM | POA: Diagnosis not present

## 2019-10-10 ENCOUNTER — Other Ambulatory Visit: Payer: Self-pay

## 2019-10-10 ENCOUNTER — Encounter: Payer: Self-pay | Admitting: Orthopaedic Surgery

## 2019-10-10 ENCOUNTER — Ambulatory Visit (INDEPENDENT_AMBULATORY_CARE_PROVIDER_SITE_OTHER): Payer: Medicare Other | Admitting: Orthopaedic Surgery

## 2019-10-10 ENCOUNTER — Ambulatory Visit (INDEPENDENT_AMBULATORY_CARE_PROVIDER_SITE_OTHER): Payer: Medicare Other

## 2019-10-10 DIAGNOSIS — Z96652 Presence of left artificial knee joint: Secondary | ICD-10-CM

## 2019-10-10 DIAGNOSIS — M79605 Pain in left leg: Secondary | ICD-10-CM | POA: Diagnosis not present

## 2019-10-10 DIAGNOSIS — M25562 Pain in left knee: Secondary | ICD-10-CM | POA: Diagnosis not present

## 2019-10-10 MED ORDER — LIDOCAINE HCL 1 % IJ SOLN
3.0000 mL | INTRAMUSCULAR | Status: AC | PRN
  Administered 2019-10-10: 16:00:00 3 mL

## 2019-10-10 MED ORDER — METHYLPREDNISOLONE ACETATE 40 MG/ML IJ SUSP
40.0000 mg | INTRAMUSCULAR | Status: AC | PRN
  Administered 2019-10-10: 16:00:00 40 mg via INTRA_ARTICULAR

## 2019-10-10 NOTE — Progress Notes (Signed)
Office Visit Note   Patient: [name]           Date of Birth: 1941/05/24           MRN: [id number] Visit Date: 10/10/2019              Requested by: Celene Squibb, MD [place],  [place] [address] PCP: Celene Squibb, MD   Assessment & Plan: Visit Diagnoses:  1. Pain in left leg   2. Acute pain of left knee   3. History of total knee arthroplasty, left     Plan: Hopefully she is dealing with just some inflammation and synovitis in her knee.  There is no effusion today and I feel stable.  Since it has been 7 years since that knee has been replaced and the x-rays appear normal, I would recommend a one-time intra-articular steroid injection to see if this will calm down a local synovitis.  She agrees with this treatment plan and tolerated the injection well.  Follow-up will be as needed.  If things worsen in any way or not getting better she will let us know.  All questions and concerns were answered and addressed.  Follow-Up Instructions: Return if symptoms worsen or fail to improve.   Orders:  Orders Placed This Encounter  Procedures  . Large Joint Inj  . XR Knee 1-2 Views Left   No orders of the defined types were placed in this encounter.     Procedures: Large Joint Inj: L knee on 10/10/2019 3:54 PM Indications: diagnostic evaluation and pain Details: 22 G 1.5 in needle, superolateral approach  Arthrogram: No  Medications: 3 mL lidocaine 1 %; 40 mg methylPREDNISolone acetate 40 MG/ML Outcome: tolerated well, no immediate complications Procedure, treatment alternatives, risks and benefits explained, specific risks discussed. Consent was given by the patient. Immediately prior to procedure a time out was called to verify the correct patient, procedure, equipment, support staff and site/side marked as required. Patient was prepped and draped in the usual sterile fashion.       Clinical Data: No additional findings.   Subjective: Chief Complaint    Patient presents with  . Left Leg - Pain  [name] is well-known to [place].  She has a history of both her knees being replaced with the right one more recently 3 years ago and the left one remotely.  She has been dealing with left lateral knee pain for just a short amount of time now.  She says it does throb some when she is getting up from a sitting position out of a chair.  She denies any instability of the knee and has not had any falls or recent injuries.  She does not state the knee swells.  She does report leg swelling better.  She is not a diabetic.  She is a very active 79 years old.  She does not walk with assistive device  HPI  Review of Systems She currently denies any headache, chest pain, shortness of breath, fever, chills, nausea, vomiting  Objective: Vital Signs: There were no vitals taken for this visit.  Physical Exam She is alert and orient x3 and in no acute distress Ortho Exam Examination of her left knee shows that it is ligamentously stable.  Her range of motion is full.  There is only some pain along the lateral joint line.  She has easily palpable pulses in her foot and no pitting edema. Specialty Comments:  No  specialty comments available.  Imaging: XR Knee 1-2 Views Left  Result Date: 10/10/2019 2 views of the left knee show a total knee arthroplasty with no complicating features.  There is no evidence of loosening.  There is no effusion.    PMFS History: Patient Active Problem List   Diagnosis Date Noted  . Status post total right knee replacement 11/25/2016  . Unilateral primary osteoarthritis, right knee 10/22/2016  . Trigger thumb, left thumb 10/22/2016  . Morbid obesity ([place]) 06/19/2015  . GERD (gastroesophageal reflux disease) 01/20/2011  . Colon polyp 01/20/2011  . Hemorrhoids 01/20/2011   Past Medical History:  Diagnosis Date  . Allergy    takes Claritin daily  . Arthritis   . Complication of anesthesia   . Constipation    takes Senokot  daily  . Depression    takes Lexapro daily  . Family history of adverse reaction to anesthesia    sister has a hard time waking up  . GERD (gastroesophageal reflux disease)   . History of blood clots 1979   after C/S clot went to lung  . History of bronchitis    3 yrs ago  . History of migraine   . Hypothyroidism    takes Synthroid daily  . Joint pain   . Joint swelling   . Personal history of colonic polyps    benign  . PONV (postoperative nausea and vomiting)   . Rheumatic fever 1958   hospitalized for three months  . Urinary leakage    takes Ditropan daily  . Vitamin D deficiency    takes Vit D daily    Family History  Problem Relation Age of Onset  . Diabetes Unknown        siblings, mother  . Lung disease Father        black lung, died young  . Heart disease Brother   . Colon cancer Neg Hx     Past Surgical History:  Procedure Laterality Date  . APPENDECTOMY     incidental at time of C-section  . BACK SURGERY  1986  . cataract surgery Bilateral   . CESAREAN SECTION  1979  . CHOLECYSTECTOMY  1982  . COLONOSCOPY  2004   Dr. Imogene Burn polyps, left sided diverticula. Path unavailable.   . COLONOSCOPY  02/12/2011   Procedure: COLONOSCOPY;  Surgeon: Daneil Dolin, MD;  [facility];  Service: Endoscopy;  Laterality: N/A;  . ESOPHAGOGASTRODUODENOSCOPY  02/12/2011   Procedure: ESOPHAGOGASTRODUODENOSCOPY (EGD);  Surgeon: Daneil Dolin, MD;  [facility];  Service: Endoscopy;  Laterality: N/A;  . GASTRIC ROUX-EN-Y N/A 06/19/2015   Procedure: LAPAROSCOPIC ROUX-EN-Y GASTRIC BYPASS WITH UPPER ENDOSCOPY;  Surgeon: Excell Seltzer, MD;  Location: [facility];  Service: General;  Laterality: N/A;  . [place]   left  . TOTAL KNEE ARTHROPLASTY  10/2009   left  . TOTAL KNEE ARTHROPLASTY Right 11/25/2016   Procedure: RIGHT TOTAL KNEE ARTHROPLASTY;  Surgeon: Mcarthur Rossetti, MD;  Location: [facility];  Service: Orthopedics;  Laterality: Right;    . UPPER GI ENDOSCOPY  06/19/2015   Procedure: UPPER GI ENDOSCOPY;  Surgeon: Excell Seltzer, MD;  [facility]: [facility];  Service: General;;   Social History   Occupational History    Employer: retired  Tobacco Use  . Smoking status: Never Smoker  . Smokeless tobacco: Never Used  Substance and Sexual Activity  . Alcohol use: No  . Drug use: No  . Sexual activity: Not on file

## 2019-10-11 DIAGNOSIS — E039 Hypothyroidism, unspecified: Secondary | ICD-10-CM | POA: Diagnosis not present

## 2019-10-11 DIAGNOSIS — F331 Major depressive disorder, recurrent, moderate: Secondary | ICD-10-CM | POA: Diagnosis not present

## 2019-10-11 DIAGNOSIS — N3281 Overactive bladder: Secondary | ICD-10-CM | POA: Diagnosis not present

## 2019-10-11 DIAGNOSIS — Z6832 Body mass index (BMI) 32.0-32.9, adult: Secondary | ICD-10-CM | POA: Diagnosis not present

## 2019-10-11 DIAGNOSIS — E6609 Other obesity due to excess calories: Secondary | ICD-10-CM | POA: Diagnosis not present

## 2019-10-11 DIAGNOSIS — R011 Cardiac murmur, unspecified: Secondary | ICD-10-CM | POA: Diagnosis not present

## 2019-10-11 DIAGNOSIS — N182 Chronic kidney disease, stage 2 (mild): Secondary | ICD-10-CM | POA: Diagnosis not present

## 2019-10-14 ENCOUNTER — Other Ambulatory Visit (HOSPITAL_COMMUNITY): Payer: Self-pay | Admitting: Internal Medicine

## 2019-10-14 DIAGNOSIS — Z13820 Encounter for screening for osteoporosis: Secondary | ICD-10-CM

## 2019-12-07 DIAGNOSIS — F331 Major depressive disorder, recurrent, moderate: Secondary | ICD-10-CM | POA: Diagnosis not present

## 2019-12-07 DIAGNOSIS — F411 Generalized anxiety disorder: Secondary | ICD-10-CM | POA: Diagnosis not present

## 2019-12-07 DIAGNOSIS — E6609 Other obesity due to excess calories: Secondary | ICD-10-CM | POA: Diagnosis not present

## 2019-12-07 DIAGNOSIS — K219 Gastro-esophageal reflux disease without esophagitis: Secondary | ICD-10-CM | POA: Diagnosis not present

## 2019-12-07 DIAGNOSIS — N3281 Overactive bladder: Secondary | ICD-10-CM | POA: Diagnosis not present

## 2019-12-07 DIAGNOSIS — F339 Major depressive disorder, recurrent, unspecified: Secondary | ICD-10-CM | POA: Diagnosis not present

## 2019-12-07 DIAGNOSIS — E7849 Other hyperlipidemia: Secondary | ICD-10-CM | POA: Diagnosis not present

## 2019-12-07 DIAGNOSIS — N182 Chronic kidney disease, stage 2 (mild): Secondary | ICD-10-CM | POA: Diagnosis not present

## 2019-12-07 DIAGNOSIS — B373 Candidiasis of vulva and vagina: Secondary | ICD-10-CM | POA: Diagnosis not present

## 2019-12-07 DIAGNOSIS — E782 Mixed hyperlipidemia: Secondary | ICD-10-CM | POA: Diagnosis not present

## 2019-12-07 DIAGNOSIS — I10 Essential (primary) hypertension: Secondary | ICD-10-CM | POA: Diagnosis not present

## 2019-12-07 DIAGNOSIS — E039 Hypothyroidism, unspecified: Secondary | ICD-10-CM | POA: Diagnosis not present

## 2019-12-20 DIAGNOSIS — K219 Gastro-esophageal reflux disease without esophagitis: Secondary | ICD-10-CM | POA: Diagnosis not present

## 2019-12-20 DIAGNOSIS — E039 Hypothyroidism, unspecified: Secondary | ICD-10-CM | POA: Diagnosis not present

## 2019-12-20 DIAGNOSIS — E6609 Other obesity due to excess calories: Secondary | ICD-10-CM | POA: Diagnosis not present

## 2019-12-20 DIAGNOSIS — R7301 Impaired fasting glucose: Secondary | ICD-10-CM | POA: Diagnosis not present

## 2019-12-20 DIAGNOSIS — F339 Major depressive disorder, recurrent, unspecified: Secondary | ICD-10-CM | POA: Diagnosis not present

## 2019-12-20 DIAGNOSIS — E7849 Other hyperlipidemia: Secondary | ICD-10-CM | POA: Diagnosis not present

## 2019-12-20 DIAGNOSIS — F331 Major depressive disorder, recurrent, moderate: Secondary | ICD-10-CM | POA: Diagnosis not present

## 2019-12-20 DIAGNOSIS — E782 Mixed hyperlipidemia: Secondary | ICD-10-CM | POA: Diagnosis not present

## 2019-12-20 DIAGNOSIS — N3281 Overactive bladder: Secondary | ICD-10-CM | POA: Diagnosis not present

## 2019-12-20 DIAGNOSIS — R945 Abnormal results of liver function studies: Secondary | ICD-10-CM | POA: Diagnosis not present

## 2019-12-20 DIAGNOSIS — N182 Chronic kidney disease, stage 2 (mild): Secondary | ICD-10-CM | POA: Diagnosis not present

## 2019-12-20 DIAGNOSIS — F411 Generalized anxiety disorder: Secondary | ICD-10-CM | POA: Diagnosis not present

## 2019-12-20 DIAGNOSIS — I10 Essential (primary) hypertension: Secondary | ICD-10-CM | POA: Diagnosis not present

## 2019-12-20 DIAGNOSIS — B373 Candidiasis of vulva and vagina: Secondary | ICD-10-CM | POA: Diagnosis not present

## 2020-03-05 ENCOUNTER — Emergency Department (HOSPITAL_COMMUNITY): Payer: Medicare Other

## 2020-03-05 ENCOUNTER — Encounter (HOSPITAL_COMMUNITY): Payer: Self-pay | Admitting: *Deleted

## 2020-03-05 ENCOUNTER — Other Ambulatory Visit: Payer: Self-pay

## 2020-03-05 ENCOUNTER — Emergency Department (HOSPITAL_COMMUNITY)
Admission: EM | Admit: 2020-03-05 | Discharge: 2020-03-05 | Disposition: A | Discharge status: Home / Self Care | Payer: Medicare Other | Attending: Emergency Medicine | Admitting: Emergency Medicine

## 2020-03-05 DIAGNOSIS — R0781 Pleurodynia: Secondary | ICD-10-CM | POA: Diagnosis not present

## 2020-03-05 DIAGNOSIS — Z5321 Procedure and treatment not carried out due to patient leaving prior to being seen by health care provider: Secondary | ICD-10-CM | POA: Diagnosis not present

## 2020-03-05 NOTE — ED Notes (Signed)
Not in room when called for room assignment

## 2020-03-05 NOTE — ED Triage Notes (Signed)
Pain in right rib cage area after cutting shrubs 3 days ago, pain is worse with movement

## 2020-03-06 ENCOUNTER — Telehealth: Payer: Self-pay | Admitting: Orthopaedic Surgery

## 2020-03-06 NOTE — Telephone Encounter (Signed)
Patient called.   She said she is experiencing pain in her left side rib and wanted to come in to get xrays. I told her that Dr.Blackman or Gil's next available was not until next week. She wanted to see if she could be worked in sooner  Call back: [phone]

## 2020-03-06 NOTE — Telephone Encounter (Signed)
Will she not see Artis Delay? HE has a few openings

## 2020-03-06 NOTE — Telephone Encounter (Signed)
I left a message on her machine letting her know Artis Delay had an appt tomorrow  and Thursday

## 2020-03-07 ENCOUNTER — Ambulatory Visit (INDEPENDENT_AMBULATORY_CARE_PROVIDER_SITE_OTHER): Payer: Medicare Other

## 2020-03-07 ENCOUNTER — Other Ambulatory Visit: Payer: Self-pay

## 2020-03-07 ENCOUNTER — Ambulatory Visit (INDEPENDENT_AMBULATORY_CARE_PROVIDER_SITE_OTHER): Payer: Medicare Other | Admitting: Physician Assistant

## 2020-03-07 ENCOUNTER — Encounter: Payer: Self-pay | Admitting: Physician Assistant

## 2020-03-07 DIAGNOSIS — R0781 Pleurodynia: Secondary | ICD-10-CM

## 2020-03-07 NOTE — Progress Notes (Signed)
Office Visit Note   Patient: [name]           Date of Birth: 11-17-1940           MRN: [id number] Visit Date: 03/07/2020              Requested by: Celene Squibb, MD [place],  [place] [address] PCP: Celene Squibb, MD   Assessment & Plan: Visit Diagnoses:  1. Rib pain     Plan: Reassurance was given to the patient that with time this should resolve.  She is activities as tolerated.  Would recommend she perform low impact activities least for the next 2 to 3 weeks.  If she develops any shortness of breath or severe chest pain she will go to the ER otherwise follow-up with [place] on an as-needed basis.  Questions encouraged  Follow-Up Instructions: Return if symptoms worsen or fail to improve.   Orders:  Orders Placed This Encounter  Procedures  . XR Ribs Unilateral Right   No orders of the defined types were placed in this encounter.     Procedures: No procedures performed   Clinical Data: No additional findings.   Subjective: Chief Complaint  Patient presents with  . Chest - Pain    HPI [name] is well-known but medical service comes in today after reaching to cut some shrubs over handicapped rail 4 days ago at her home.  She has had pain in the area of the right lower ribs after cutting the shrubbery.  No fall.  She has had no shortness of breath no chest pain otherwise. Review of Systems See HPI  Objective: Vital Signs: There were no vitals taken for this visit.  Physical Exam Constitutional:      Appearance: She is not ill-appearing or diaphoretic.  Pulmonary:     Effort: Pulmonary effort is normal.  Neurological:     Mental Status: She is alert and oriented to person, place, and time.  Psychiatric:        Mood and Affect: Mood normal.     Ortho Exam Tenderness over the right lower anterior ribs with palpation.  No areas of ecchymosis anterior abdomen particularly right side.  Specialty Comments:  No specialty comments  available.  Imaging: XR Ribs Unilateral Right  Result Date: 03/07/2020 Right rib films: 3 views show possible 10th rib fracture medial one fourth aspect of the shaft.  No displacement of the ribs noted.  No obvious pneumothorax.  Lung field appears clear.    PMFS History: Patient Active Problem List   Diagnosis Date Noted  . Status post total right knee replacement 11/25/2016  . Unilateral primary osteoarthritis, right knee 10/22/2016  . Trigger thumb, left thumb 10/22/2016  . Morbid obesity ([place]) 06/19/2015  . GERD (gastroesophageal reflux disease) 01/20/2011  . Colon polyp 01/20/2011  . Hemorrhoids 01/20/2011   Past Medical History:  Diagnosis Date  . Allergy    takes Claritin daily  . Arthritis   . Complication of anesthesia   . Constipation    takes Senokot daily  . Depression    takes Lexapro daily  . Family history of adverse reaction to anesthesia    sister has a hard time waking up  . GERD (gastroesophageal reflux disease)   . History of blood clots 1979   after C/S clot went to lung  . History of bronchitis    3 yrs ago  . History of migraine   . Hypothyroidism  takes Synthroid daily  . Joint pain   . Joint swelling   . Personal history of colonic polyps    benign  . PONV (postoperative nausea and vomiting)   . Rheumatic fever 1958   hospitalized for three months  . Urinary leakage    takes Ditropan daily  . Vitamin D deficiency    takes Vit D daily    Family History  Problem Relation Age of Onset  . Diabetes Other        siblings, mother  . Lung disease Father        black lung, died young  . Heart disease Brother   . Colon cancer Neg Hx     Past Surgical History:  Procedure Laterality Date  . APPENDECTOMY     incidental at time of C-section  . BACK SURGERY  1986  . cataract surgery Bilateral   . CESAREAN SECTION  1979  . CHOLECYSTECTOMY  1982  . COLONOSCOPY  2004   Dr. Imogene Burn polyps, left sided diverticula. Path unavailable.     . COLONOSCOPY  02/12/2011   Procedure: COLONOSCOPY;  Surgeon: Daneil Dolin, MD;  [facility];  Service: Endoscopy;  Laterality: N/A;  . ESOPHAGOGASTRODUODENOSCOPY  02/12/2011   Procedure: ESOPHAGOGASTRODUODENOSCOPY (EGD);  Surgeon: Daneil Dolin, MD;  [facility];  Service: Endoscopy;  Laterality: N/A;  . GASTRIC ROUX-EN-Y N/A 06/19/2015   Procedure: LAPAROSCOPIC ROUX-EN-Y GASTRIC BYPASS WITH UPPER ENDOSCOPY;  Surgeon: Excell Seltzer, MD;  [facility];  Service: General;  Laterality: N/A;  . [place]   left  . TOTAL KNEE ARTHROPLASTY  10/2009   left  . TOTAL KNEE ARTHROPLASTY Right 11/25/2016   Procedure: RIGHT TOTAL KNEE ARTHROPLASTY;  Surgeon: Mcarthur Rossetti, MD;  Location: [facility];  Service: Orthopedics;  Laterality: Right;  . UPPER GI ENDOSCOPY  06/19/2015   Procedure: UPPER GI ENDOSCOPY;  Surgeon: Excell Seltzer, MD;  [facility];  Service: General;;   Social History   Occupational History    Employer: retired  Tobacco Use  . Smoking status: Never Smoker  . Smokeless tobacco: Never Used  Substance and Sexual Activity  . Alcohol use: No  . Drug use: No  . Sexual activity: Not on file

## 2020-04-12 DIAGNOSIS — E039 Hypothyroidism, unspecified: Secondary | ICD-10-CM | POA: Diagnosis not present

## 2020-04-12 DIAGNOSIS — R945 Abnormal results of liver function studies: Secondary | ICD-10-CM | POA: Diagnosis not present

## 2020-04-12 DIAGNOSIS — F411 Generalized anxiety disorder: Secondary | ICD-10-CM | POA: Diagnosis not present

## 2020-04-12 DIAGNOSIS — R7301 Impaired fasting glucose: Secondary | ICD-10-CM | POA: Diagnosis not present

## 2020-04-12 DIAGNOSIS — K219 Gastro-esophageal reflux disease without esophagitis: Secondary | ICD-10-CM | POA: Diagnosis not present

## 2020-04-12 DIAGNOSIS — E6609 Other obesity due to excess calories: Secondary | ICD-10-CM | POA: Diagnosis not present

## 2020-04-12 DIAGNOSIS — Z6833 Body mass index (BMI) 33.0-33.9, adult: Secondary | ICD-10-CM | POA: Diagnosis not present

## 2020-04-12 DIAGNOSIS — I10 Essential (primary) hypertension: Secondary | ICD-10-CM | POA: Diagnosis not present

## 2020-04-12 DIAGNOSIS — E782 Mixed hyperlipidemia: Secondary | ICD-10-CM | POA: Diagnosis not present

## 2020-04-12 DIAGNOSIS — F339 Major depressive disorder, recurrent, unspecified: Secondary | ICD-10-CM | POA: Diagnosis not present

## 2020-04-12 DIAGNOSIS — R32 Unspecified urinary incontinence: Secondary | ICD-10-CM | POA: Diagnosis not present

## 2020-04-12 DIAGNOSIS — Z6832 Body mass index (BMI) 32.0-32.9, adult: Secondary | ICD-10-CM | POA: Diagnosis not present

## 2020-04-17 DIAGNOSIS — Z0001 Encounter for general adult medical examination with abnormal findings: Secondary | ICD-10-CM | POA: Diagnosis not present

## 2020-04-17 DIAGNOSIS — F331 Major depressive disorder, recurrent, moderate: Secondary | ICD-10-CM | POA: Diagnosis not present

## 2020-04-17 DIAGNOSIS — N3281 Overactive bladder: Secondary | ICD-10-CM | POA: Diagnosis not present

## 2020-04-17 DIAGNOSIS — N182 Chronic kidney disease, stage 2 (mild): Secondary | ICD-10-CM | POA: Diagnosis not present

## 2020-04-17 DIAGNOSIS — Z6832 Body mass index (BMI) 32.0-32.9, adult: Secondary | ICD-10-CM | POA: Diagnosis not present

## 2020-04-17 DIAGNOSIS — E6609 Other obesity due to excess calories: Secondary | ICD-10-CM | POA: Diagnosis not present

## 2020-04-17 DIAGNOSIS — R42 Dizziness and giddiness: Secondary | ICD-10-CM | POA: Diagnosis not present

## 2020-04-17 DIAGNOSIS — R011 Cardiac murmur, unspecified: Secondary | ICD-10-CM | POA: Diagnosis not present

## 2020-04-17 DIAGNOSIS — E039 Hypothyroidism, unspecified: Secondary | ICD-10-CM | POA: Diagnosis not present

## 2020-04-17 DIAGNOSIS — Z23 Encounter for immunization: Secondary | ICD-10-CM | POA: Diagnosis not present

## 2020-04-24 DIAGNOSIS — K219 Gastro-esophageal reflux disease without esophagitis: Secondary | ICD-10-CM | POA: Diagnosis not present

## 2020-04-24 DIAGNOSIS — E039 Hypothyroidism, unspecified: Secondary | ICD-10-CM | POA: Diagnosis not present

## 2020-04-24 DIAGNOSIS — I10 Essential (primary) hypertension: Secondary | ICD-10-CM | POA: Diagnosis not present

## 2020-04-24 DIAGNOSIS — E789 Disorder of lipoprotein metabolism, unspecified: Secondary | ICD-10-CM | POA: Diagnosis not present

## 2020-05-04 DIAGNOSIS — R42 Dizziness and giddiness: Secondary | ICD-10-CM | POA: Diagnosis not present

## 2020-05-07 DIAGNOSIS — Z6832 Body mass index (BMI) 32.0-32.9, adult: Secondary | ICD-10-CM | POA: Diagnosis not present

## 2020-05-07 DIAGNOSIS — L900 Lichen sclerosus et atrophicus: Secondary | ICD-10-CM | POA: Diagnosis not present

## 2020-05-07 DIAGNOSIS — N3281 Overactive bladder: Secondary | ICD-10-CM | POA: Diagnosis not present

## 2020-05-07 DIAGNOSIS — Z01419 Encounter for gynecological examination (general) (routine) without abnormal findings: Secondary | ICD-10-CM | POA: Diagnosis not present

## 2020-05-07 DIAGNOSIS — N952 Postmenopausal atrophic vaginitis: Secondary | ICD-10-CM | POA: Diagnosis not present

## 2020-05-07 DIAGNOSIS — Z1231 Encounter for screening mammogram for malignant neoplasm of breast: Secondary | ICD-10-CM | POA: Diagnosis not present

## 2020-05-08 ENCOUNTER — Ambulatory Visit: Payer: Medicare Other | Attending: Internal Medicine

## 2020-05-08 DIAGNOSIS — Z23 Encounter for immunization: Secondary | ICD-10-CM

## 2020-05-08 NOTE — Progress Notes (Signed)
   Covid-19 Vaccination Clinic  Name:  [name]    MRN: [id number] DOB: 1940-11-16  05/08/2020  [name] was observed post Covid-19 immunization for 15 minutes without incident. She was provided with Vaccine Information Sheet and instruction to access the V-Safe system.   [name] was instructed to call 911 with any severe reactions post vaccine: Marland Kitchen Difficulty breathing  . Swelling of face and throat  . A fast heartbeat  . A bad rash all over body  . Dizziness and weakness

## 2020-07-14 ENCOUNTER — Encounter (HOSPITAL_COMMUNITY): Payer: Self-pay

## 2020-07-14 ENCOUNTER — Emergency Department (HOSPITAL_COMMUNITY)
Admission: EM | Admit: 2020-07-14 | Discharge: 2020-07-14 | Disposition: A | Discharge status: Home / Self Care | Payer: Medicare Other | Attending: Emergency Medicine | Admitting: Emergency Medicine

## 2020-07-14 ENCOUNTER — Other Ambulatory Visit: Payer: Self-pay

## 2020-07-14 ENCOUNTER — Ambulatory Visit
Admission: EM | Admit: 2020-07-14 | Discharge: 2020-07-14 | Disposition: A | Discharge status: Home / Self Care | Payer: Medicare Other | Attending: Internal Medicine | Admitting: Internal Medicine

## 2020-07-14 ENCOUNTER — Emergency Department (HOSPITAL_COMMUNITY): Payer: Medicare Other

## 2020-07-14 ENCOUNTER — Ambulatory Visit (INDEPENDENT_AMBULATORY_CARE_PROVIDER_SITE_OTHER): Payer: Medicare Other

## 2020-07-14 DIAGNOSIS — Z79899 Other long term (current) drug therapy: Secondary | ICD-10-CM | POA: Diagnosis not present

## 2020-07-14 DIAGNOSIS — S43015A Anterior dislocation of left humerus, initial encounter: Secondary | ICD-10-CM | POA: Diagnosis not present

## 2020-07-14 DIAGNOSIS — W010XXA Fall on same level from slipping, tripping and stumbling without subsequent striking against object, initial encounter: Secondary | ICD-10-CM | POA: Insufficient documentation

## 2020-07-14 DIAGNOSIS — W19XXXA Unspecified fall, initial encounter: Secondary | ICD-10-CM | POA: Diagnosis not present

## 2020-07-14 DIAGNOSIS — S43005D Unspecified dislocation of left shoulder joint, subsequent encounter: Secondary | ICD-10-CM | POA: Diagnosis not present

## 2020-07-14 DIAGNOSIS — Z96653 Presence of artificial knee joint, bilateral: Secondary | ICD-10-CM | POA: Insufficient documentation

## 2020-07-14 DIAGNOSIS — S4992XA Unspecified injury of left shoulder and upper arm, initial encounter: Secondary | ICD-10-CM | POA: Diagnosis present

## 2020-07-14 DIAGNOSIS — Z7982 Long term (current) use of aspirin: Secondary | ICD-10-CM | POA: Diagnosis not present

## 2020-07-14 DIAGNOSIS — M25512 Pain in left shoulder: Secondary | ICD-10-CM | POA: Diagnosis not present

## 2020-07-14 DIAGNOSIS — S43005A Unspecified dislocation of left shoulder joint, initial encounter: Secondary | ICD-10-CM

## 2020-07-14 DIAGNOSIS — E039 Hypothyroidism, unspecified: Secondary | ICD-10-CM | POA: Insufficient documentation

## 2020-07-14 DIAGNOSIS — S43014A Anterior dislocation of right humerus, initial encounter: Secondary | ICD-10-CM | POA: Diagnosis not present

## 2020-07-14 DIAGNOSIS — R9431 Abnormal electrocardiogram [ECG] [EKG]: Secondary | ICD-10-CM | POA: Diagnosis not present

## 2020-07-14 DIAGNOSIS — R6889 Other general symptoms and signs: Secondary | ICD-10-CM | POA: Diagnosis not present

## 2020-07-14 DIAGNOSIS — M25519 Pain in unspecified shoulder: Secondary | ICD-10-CM | POA: Diagnosis not present

## 2020-07-14 DIAGNOSIS — Z743 Need for continuous supervision: Secondary | ICD-10-CM | POA: Diagnosis not present

## 2020-07-14 MED ORDER — PROPOFOL 10 MG/ML IV BOLUS
100.0000 mg | Freq: Once | INTRAVENOUS | Status: AC
  Administered 2020-07-14: 15:00:00 20 mg via INTRAVENOUS
  Filled 2020-07-14: qty 20

## 2020-07-14 MED ORDER — FENTANYL CITRATE (PF) 100 MCG/2ML IJ SOLN
100.0000 ug | Freq: Once | INTRAMUSCULAR | Status: AC
  Administered 2020-07-14: 15:00:00 100 ug via INTRAVENOUS
  Filled 2020-07-14: qty 2

## 2020-07-14 MED ORDER — ONDANSETRON HCL 4 MG/2ML IJ SOLN
INTRAMUSCULAR | Status: AC
  Administered 2020-07-14: 16:00:00 4 mg
  Filled 2020-07-14: qty 2

## 2020-07-14 MED ORDER — MIDAZOLAM HCL 2 MG/2ML IJ SOLN
2.0000 mg | Freq: Once | INTRAMUSCULAR | Status: AC
  Administered 2020-07-14: 15:00:00 2 mg via INTRAVENOUS
  Filled 2020-07-14: qty 2

## 2020-07-14 NOTE — ED Triage Notes (Addendum)
Pt fell today after tripping over ledge on ramp . Pt laid for one hour until she was able to get help. Pt went to urgent care and sent here due to dislocated left shoulder

## 2020-07-14 NOTE — Discharge Instructions (Signed)
You are seen today for shoulder dislocation, which you to continue to use the brace as instructed.  We were able to put this back into place with anesthesia.  I want you to follow-up with Dr. Ninfa Linden, Ortho doctor in the next couple of days, schedule appointment with him.  If you have any new or worsening concerning symptoms you can come back to the emergency department.  Please use the attached instructions.  If you have any pain you can take ibuprofen or Tylenol as directed on the bottle.

## 2020-07-14 NOTE — ED Notes (Signed)
MD at bedside for conscious sedation, sedation started

## 2020-07-14 NOTE — ED Notes (Signed)
Immobilizing sling applied to left arm

## 2020-07-14 NOTE — ED Provider Notes (Signed)
This patient is a 79 year old female who presents from urgent care after having a fall where she struck her shoulder on a hand railing as she fell down accidentally tripping over her feet. She complains of left shoulder pain, was diagnosed with a dislocated shoulder. On exam she has a deformity to the left shoulder with normal pulses at the left wrist, she is otherwise well-appearing with clear heart sounds, clear lung sounds, no other injuries including the legs or the chest or the head or the neck. I have personally viewed the x-ray, there is an anterior inferior dislocation of the left glenohumeral joint, sedation performed by myself, reduction performed by the physician assistant, see her separate note. I was present for the entire procedure, the patient is stable for discharge  .Sedation  Date/Time: 07/14/2020 3:28 PM Performed by: Noemi Chapel, MD Authorized by: Noemi Chapel, MD   Consent:    Consent obtained:  Verbal   Consent given by:  Patient   Risks discussed:  Allergic reaction, inadequate sedation, nausea, dysrhythmia, prolonged hypoxia resulting in organ damage, prolonged sedation necessitating reversal, respiratory compromise necessitating ventilatory assistance and intubation and vomiting   Alternatives discussed:  Analgesia without sedation and anxiolysis Universal protocol:    Procedure explained and questions answered to patient or proxy's satisfaction: yes     Relevant documents present and verified: yes     Test results available: yes     Imaging studies available: yes     Required blood products, implants, devices, and special equipment available: yes     Site/side marked: yes     Immediately prior to procedure, a time out was called: yes     Patient identity confirmed:  Verbally with patient Indications:    Procedure performed:  Dislocation reduction   Procedure necessitating sedation performed by:  Different physician Pre-sedation assessment:    Time since last  food or drink:  3 hours   ASA classification: class 2 - patient with mild systemic disease     Mouth opening:  3 or more finger widths   Mallampati score:  II - soft palate, uvula, fauces visible   Neck mobility: normal     Pre-sedation assessments completed and reviewed: airway patency, cardiovascular function, hydration status, mental status, nausea/vomiting, pain level, respiratory function and temperature     Pre-sedation assessment completed:  07/14/2020 3:10 PM Immediate pre-procedure details:    Reassessment: Patient reassessed immediately prior to procedure     Reviewed: vital signs and NPO status     Verified: bag valve mask available, emergency equipment available, intubation equipment available, IV patency confirmed, oxygen available and suction available   Procedure details (see MAR for exact dosages):    Preoxygenation:  Nasal cannula   Sedation:  Midazolam and propofol   Intended level of sedation: deep   Analgesia:  Fentanyl   Intra-procedure monitoring:  Blood pressure monitoring, cardiac monitor, continuous capnometry, continuous pulse oximetry, frequent LOC assessments and frequent vital sign checks   Intra-procedure events: none     Total Provider sedation time (minutes):  15 Post-procedure details:    Post-sedation assessment completed:  07/14/2020 3:40 PM   Attendance: Constant attendance by certified staff until patient recovered     Recovery: Patient returned to pre-procedure baseline     Post-sedation assessments completed and reviewed: airway patency, cardiovascular function, hydration status, mental status, nausea/vomiting, pain level, respiratory function and temperature     Patient is stable for discharge or admission: yes     Procedure completion:  Tolerated well, no immediate complications Comments:          Medical screening examination/treatment/procedure(s) were conducted as a shared visit with non-physician practitioner(s) and myself.  I personally  evaluated the patient during the encounter.  Clinical Impression:   Final diagnoses:  Dislocation of left shoulder joint, initial encounter         Noemi Chapel, MD 07/16/20 1600

## 2020-07-14 NOTE — ED Provider Notes (Signed)
[facility] EMERGENCY DEPARTMENT Provider Note   CSN: [id number] Arrival date & time: 07/14/20  1430     History Chief Complaint  Patient presents with  . Fall    [name] is a 79 y.o. female with pertinent past medical history of arthritis presents emergency department today for mechanical fall with left dislocated shoulder.  Patient states that she was outside and fell over the ledge on her ramp, patient states that she laid out in the rain until she was able to get help less than an hour, her friend drove her to urgent care.  They did get plain films there which showed dislocated left shoulder, no fractures.  Denies any previous injury to this area.  Denies any blood thinners.  States that she is generally healthy.  States that she was in normal health before this.  Patient is crying in pain.  Denies any head injury, no LOC.  No vision change, neck pain, back pain.  No chest pain or shortness of breath.  Patient denies any wrist or elbow pain, purely shoulder pain.  No numbness or tingling.  No other complaints. HPI     Past Medical History:  Diagnosis Date  . Allergy    takes Claritin daily  . Arthritis   . Complication of anesthesia   . Constipation    takes Senokot daily  . Depression    takes Lexapro daily  . Family history of adverse reaction to anesthesia    sister has a hard time waking up  . GERD (gastroesophageal reflux disease)   . History of blood clots 1979   after C/S clot went to lung  . History of bronchitis    3 yrs ago  . History of migraine   . Hypothyroidism    takes Synthroid daily  . Joint pain   . Joint swelling   . Personal history of colonic polyps    benign  . PONV (postoperative nausea and vomiting)   . Rheumatic fever 1958   hospitalized for three months  . Urinary leakage    takes Ditropan daily  . Vitamin D deficiency    takes Vit D daily    Patient Active Problem List   Diagnosis Date Noted  . Status post total right knee  replacement 11/25/2016  . Unilateral primary osteoarthritis, right knee 10/22/2016  . Trigger thumb, left thumb 10/22/2016  . Morbid obesity ([place]) 06/19/2015  . GERD (gastroesophageal reflux disease) 01/20/2011  . Colon polyp 01/20/2011  . Hemorrhoids 01/20/2011    Past Surgical History:  Procedure Laterality Date  . APPENDECTOMY     incidental at time of C-section  . BACK SURGERY  1986  . cataract surgery Bilateral   . CESAREAN SECTION  1979  . CHOLECYSTECTOMY  1982  . COLONOSCOPY  2004   Dr. Imogene Burn polyps, left sided diverticula. Path unavailable.   . COLONOSCOPY  02/12/2011   Procedure: COLONOSCOPY;  Surgeon: Daneil Dolin, MD;  [facility];  Service: Endoscopy;  Laterality: N/A;  . ESOPHAGOGASTRODUODENOSCOPY  02/12/2011   Procedure: ESOPHAGOGASTRODUODENOSCOPY (EGD);  Surgeon: Daneil Dolin, MD;  [facility];  Service: Endoscopy;  Laterality: N/A;  . GASTRIC ROUX-EN-Y N/A 06/19/2015   Procedure: LAPAROSCOPIC ROUX-EN-Y GASTRIC BYPASS WITH UPPER ENDOSCOPY;  Surgeon: Excell Seltzer, MD;  Location: [facility];  Service: General;  Laterality: N/A;  . [place]   left  . TOTAL KNEE ARTHROPLASTY  10/2009   left  . TOTAL KNEE ARTHROPLASTY  Right 11/25/2016   Procedure: RIGHT TOTAL KNEE ARTHROPLASTY;  Surgeon: Mcarthur Rossetti, MD;  Location: [place];  Service: Orthopedics;  Laterality: Right;  . UPPER GI ENDOSCOPY  06/19/2015   Procedure: UPPER GI ENDOSCOPY;  Surgeon: Excell Seltzer, MD;  Location: [facility];  Service: General;;     OB History   No obstetric history on file.     Family History  Problem Relation Age of Onset  . Diabetes Other        siblings, mother  . Lung disease Father        black lung, died young  . Heart disease Brother   . Colon cancer Neg Hx     Social History   Tobacco Use  . Smoking status: Never Smoker  . Smokeless tobacco: Never Used  Substance Use Topics  . Alcohol use: No  . Drug use: No     Home Medications Prior to Admission medications   Medication Sig Start Date End Date Taking? Authorizing Provider  acetaminophen (TYLENOL) 325 MG tablet Take 325 mg by mouth every 6 (six) hours as needed for mild pain or headache.    [provider]  amoxicillin (AMOXIL) 500 MG capsule Take 2,000 mg by mouth once. 11/19/16   [provider]  Ascorbic Acid (VITAMIN C) 500 MG CHEW Chew 500 mg by mouth 2 (two) times daily.    [provider]  aspirin EC 325 MG EC tablet Take 1 tablet (325 mg total) by mouth 2 (two) times daily after a meal. 11/27/16   Mcarthur Rossetti, MD  betamethasone valerate (VALISONE) 0.1 % cream  02/23/18   [provider]  Calcium 600-400 MG-UNIT CHEW Chew 1 tablet by mouth 3 (three) times daily.    [provider]  Calcium Polycarbophil (FIBERCON PO) Take 1 tablet by mouth at bedtime.    [provider]  cholecalciferol (VITAMIN D) 1000 units tablet Take 1,000 Units by mouth daily.    [provider]  Cyanocobalamin (VITAMIN B-12) 2500 MCG SUBL Place 2,500 mcg under the tongue daily.     [provider]  escitalopram (LEXAPRO) 20 MG tablet Take 20 mg by mouth daily.      [provider]  levothyroxine (SYNTHROID, LEVOTHROID) 100 MCG tablet Take 100 mcg by mouth every evening.     [provider]  loratadine (CLARITIN) 10 MG tablet Take 10 mg by mouth daily.    [provider]  Multiple Vitamin (MULTIVITAMIN WITH MINERALS) TABS tablet Take 1 tablet by mouth 2 (two) times daily.     [provider]  oxybutynin (DITROPAN) 5 MG tablet Take 5 mg by mouth every evening.  05/09/13   [provider]  oxyCODONE-acetaminophen (ROXICET) 5-325 MG tablet Take 1-2 tablets by mouth every 4 (four) hours as needed. 12/09/16   Pete Pelt, PA-C  sennosides-docusate sodium (SENOKOT-S) 8.6-50 MG tablet Take 2 tablets by mouth at bedtime.     [provider]     Allergies    No known allergies  Review of Systems   Review of Systems  Constitutional: Negative for chills, diaphoresis, fatigue and fever.  HENT: Negative for congestion, sore throat and trouble swallowing.   Eyes: Negative for pain and visual disturbance.  Respiratory: Negative for cough, shortness of breath and wheezing.   Cardiovascular: Negative for chest pain, palpitations and leg swelling.  Gastrointestinal: Negative for abdominal distention, abdominal pain, diarrhea, nausea and vomiting.  Genitourinary: Negative for difficulty urinating.  Musculoskeletal:  Positive for arthralgias. Negative for back pain, neck pain and neck stiffness.  Skin: Negative for pallor.  Neurological: Negative for dizziness, speech difficulty, weakness and headaches.  Psychiatric/Behavioral: Negative for confusion.    Physical Exam Updated Vital Signs BP (!) 156/88   Pulse 93   Resp 14   SpO2 96%   Physical Exam Constitutional:      General: She is in acute distress.     Appearance: Normal appearance. She is not ill-appearing, toxic-appearing or diaphoretic.     Comments: Patient crying out in severe pain, in acute distress.  HENT:     Mouth/Throat:     Mouth: Mucous membranes are moist.     Pharynx: Oropharynx is clear.  Eyes:     General: No scleral icterus.    Extraocular Movements: Extraocular movements intact.     Pupils: Pupils are equal, round, and reactive to light.  Cardiovascular:     Rate and Rhythm: Normal rate and regular rhythm.     Pulses: Normal pulses.     Heart sounds: Normal heart sounds.  Pulmonary:     Effort: Pulmonary effort is normal. No respiratory distress.     Breath sounds: Normal breath sounds. No stridor. No wheezing, rhonchi or rales.  Chest:     Chest wall: No tenderness.  Abdominal:     General: Abdomen is flat. There is no distension.     Palpations: Abdomen is soft.     Tenderness: There is no abdominal tenderness. There is no guarding or  rebound.  Musculoskeletal:        General: No swelling or tenderness. Normal range of motion.     Cervical back: Normal range of motion and neck supple. No rigidity.     Right lower leg: No edema.     Left lower leg: No edema.     Comments: Patient with obvious left shoulder deformity, limited range of motion due to pain.  Radial pulse 2+ with normal cap refill.  Able to range elbow and wrist on that side without any problems.  Skin:    General: Skin is warm and dry.     Capillary Refill: Capillary refill takes less than 2 seconds.     Coloration: Skin is not pale.  Neurological:     General: No focal deficit present.     Mental Status: She is alert and oriented to person, place, and time.  Psychiatric:        Mood and Affect: Mood normal.        Behavior: Behavior normal.     ED Results / Procedures / Treatments   Labs (all labs ordered are listed, but only abnormal results are displayed) Labs Reviewed - No data to display  EKG EKG Interpretation  Date/Time:  Saturday July 14 2020 14:53:23 EST Ventricular Rate:  75 PR Interval:    QRS Duration: 106 QT Interval:  442 QTC Calculation: 494 R Axis:   -40 Text Interpretation: Sinus rhythm Atrial premature complex Left axis deviation Abnormal R-wave progression, late transition Minimal ST elevation, lateral leads Borderline prolonged QT interval since last tracing no significant change Confirmed by Noemi Chapel [id number]) on 07/14/2020 3:12:13 PM   Radiology DG Shoulder Left  Result Date: 07/14/2020 CLINICAL DATA:  79 year old female status post fall. EXAM: LEFT SHOULDER - 2+ VIEW COMPARISON:  None. FINDINGS: Acute anterior dislocation of the humerus. No definite associated fracture. The visualized soft tissues are within normal limits. IMPRESSION: Acute, anterior dislocation of the humerus. Electronically Signed  By: Ruthann Cancer MD   On: 07/14/2020 14:20   DG Shoulder Left Portable  Result Date: 07/14/2020 CLINICAL  DATA:  Dislocation, post reduction EXAM: LEFT SHOULDER COMPARISON:  Earlier films of the same day FINDINGS: Interval reduction of previously noted glenohumeral dislocation. Osteopenia. No definite fracture. IMPRESSION: Reduction of glenohumeral dislocation. Electronically Signed   By: Lucrezia Europe M.D.   On: 07/14/2020 16:20    Procedures Reduction of dislocation  Date/Time: 07/14/2020 3:37 PM Performed by: Alfredia Client, PA-C Authorized by: Alfredia Client, PA-C  Consent: Verbal consent obtained. Risks and benefits: risks, benefits and alternatives were discussed Consent given by: patient Site marked: the operative site was marked Imaging studies: imaging studies available Time out: Immediately prior to procedure a "time out" was called to verify the correct patient, procedure, equipment, support staff and site/side marked as required. Preparation: Patient was prepped and draped in the usual sterile fashion. Local anesthesia used: no  Anesthesia: Local anesthesia used: no  Sedation: Patient sedated: yes Sedatives: fentanyl, midazolam, propofol and see MAR for details Analgesia: fentanyl  Patient tolerance: patient tolerated the procedure well with no immediate complications Comments: See Dr. Sabra Heck note for sedation     (including critical care time)  Medications Ordered in ED Medications  fentaNYL (SUBLIMAZE) injection 100 mcg (100 mcg Intravenous Given 07/14/20 1510)  midazolam (VERSED) injection 2 mg (2 mg Intravenous Given 07/14/20 1519)  propofol (DIPRIVAN) 10 mg/mL bolus/IV push 100 mg (20 mg Intravenous Given 07/14/20 1521)  ondansetron (ZOFRAN) 4 MG/2ML injection (4 mg  Given 07/14/20 1532)    ED Course  I have reviewed the triage vital signs and the nursing notes.  Pertinent labs & imaging results that were available during my care of the patient were reviewed by me and considered in my medical decision making (see chart for details).    MDM Rules/Calculators/A&P                           TVISHA SCHWOERER is a 80 y.o. female with pertinent past medical history of arthritis presents emergency department today for mechanical fall with left dislocated shoulder.  Patient crying and writhing in pain, most likely need conscious sedation at this time.  Patient is distally neurovascularly intact with obvious shoulder deformity.  Plain films without any fracture, does show dislocation.  Successfully performed conscious sedation with Dr. Sabra Heck, see his note for further detail.  Upon reevaluation, sling applied, patient is still distally neurovascularly intact.  Patient ready to be discharged, mentating normally after conscious sedation.  Repeat plain films with shoulder in place.  Doubt need for further emergent work up at this time. I explained the diagnosis and have given explicit precautions to return to the ER including for any other new or worsening symptoms. The patient understands and accepts the medical plan as it's been dictated and I have answered their questions. Discharge instructions concerning home care and prescriptions have been given. The patient is STABLE and is discharged to home in good condition.  I discussed this case with my attending physician who cosigned this note including patient's presenting symptoms, physical exam, and planned diagnostics and interventions. Attending physician stated agreement with plan or made changes to plan which were implemented.   Attending physician assessed patient at bedside.  Final Clinical Impression(s) / ED Diagnoses Final diagnoses:  Dislocation of left shoulder joint, initial encounter    Rx / DC Orders ED Discharge Orders    None  Alfredia Client, PA-C 07/14/20 1642    Noemi Chapel, MD 07/16/20 [phone]

## 2020-07-18 ENCOUNTER — Encounter: Payer: Self-pay | Admitting: Physician Assistant

## 2020-07-18 ENCOUNTER — Ambulatory Visit (INDEPENDENT_AMBULATORY_CARE_PROVIDER_SITE_OTHER): Payer: Medicare Other

## 2020-07-18 ENCOUNTER — Ambulatory Visit (INDEPENDENT_AMBULATORY_CARE_PROVIDER_SITE_OTHER): Payer: Medicare Other | Admitting: Physician Assistant

## 2020-07-18 DIAGNOSIS — S43005A Unspecified dislocation of left shoulder joint, initial encounter: Secondary | ICD-10-CM | POA: Diagnosis not present

## 2020-07-18 DIAGNOSIS — M79605 Pain in left leg: Secondary | ICD-10-CM

## 2020-07-18 NOTE — Progress Notes (Signed)
Office Visit Note   Patient: [name]           Date of Birth: 1940/12/09           MRN: [id number] Visit Date: 07/18/2020              Requested by: Celene Squibb, MD [place],  [place] [address] PCP: Celene Squibb, MD   Assessment & Plan: Visit Diagnoses:  1. Pain in left leg   2. Dislocation of left shoulder joint, initial encounter     Plan: We will continue the left shoulder sling she is to wear this at all times except for hygiene purposes.  No overhead activity.  No extreme external/internal rotation or extreme abduction.  In regards to her left knee and lower leg pain conservative measures such as ice elevation.  Reviewed the radiographs with her today that showed no acute fracture involving the left knee or left lower extremity.  Follow-Up Instructions: Return in about 2 weeks (around 08/01/2020).   Orders:  Orders Placed This Encounter  Procedures  . XR Tibia/Fibula Left   No orders of the defined types were placed in this encounter.     Procedures: No procedures performed   Clinical Data: No additional findings.   Subjective: Chief Complaint  Patient presents with  . Left Shoulder - Pain  . Left Knee - Pain    HPI [name] is well-known to Dr. Ninfa Linden service comes in today due to an acute left shoulder dislocation.  She reports that this past Saturday she was coming in her home and tripped over the ledge of a ra stubbing her right great toe causing her to fall onto her left shoulder.  She initially was seen in urgent care and then sent to the emergency room in [facility] was found to have a left shoulder dislocation.  This was reduced and she is placed in a sling here today for follow-up.  She denies any loss of consciousness.  Review of shoulder films shows an anterior dislocation of the left shoulder for the postreduction film.  No acute fractures involving the left shoulder or proximal humerus.  No gross deformity of the  ribs and lung field appears normal.  She has had some pain with deep breathing but no shortness of breath.  She also is having left knee pain she is has history of bilateral knee replacements by Dr. Ninfa Linden done well. Review of Systems See HPI.  Objective: Vital Signs: There were no vitals taken for this visit.  Physical Exam General: Well-developed well-nourished female no acute distress mood affect appropriate. Psych: Alert and oriented x3 Ortho Exam Left arm she has full range of motion of the elbow full supination pronation of forearm.  Sensation motor intact throughout the left hand.  No attempts of range of motion of the shoulder today. Left lower extremity she has good range of motion of her knee.  Well-healed surgical incision from total knee replacement.  Area of significant ecchymosis over the anterior aspect of the knee.  No gross instability valgus varus stressing.  Calf supple nontender.  She has tenderness down the shaft of the left tibia.  No tenderness of the ankle. Right great toe edema.  No gross deformity.  Hallux valgus deformity which is symmetric with the left great toe. Specialty Comments:  No specialty comments available.  Imaging: XR Tibia/Fibula Left  Result Date: 07/18/2020 Left tibia 2 views: No acute fractures.  Left total  knee arthroplasty components appear well-seated.  Knee is well located.  Ankle joint is well located.    PMFS History: Patient Active Problem List   Diagnosis Date Noted  . Thyroid dysfunction 02/25/2019  . Overactive bladder 02/25/2019  . Lichen sclerosus of female genitalia 02/25/2019  . Status post total right knee replacement 11/25/2016  . Unilateral primary osteoarthritis, right knee 10/22/2016  . Trigger thumb, left thumb 10/22/2016  . Morbid obesity ([place]) 06/19/2015  . GERD (gastroesophageal reflux disease) 01/20/2011  . Colon polyp 01/20/2011  . Hemorrhoids 01/20/2011   Past Medical History:  Diagnosis Date  . Allergy     takes Claritin daily  . Arthritis   . Complication of anesthesia   . Constipation    takes Senokot daily  . Depression    takes Lexapro daily  . Family history of adverse reaction to anesthesia    sister has a hard time waking up  . GERD (gastroesophageal reflux disease)   . History of blood clots 1979   after C/S clot went to lung  . History of bronchitis    3 yrs ago  . History of migraine   . Hypothyroidism    takes Synthroid daily  . Joint pain   . Joint swelling   . Personal history of colonic polyps    benign  . PONV (postoperative nausea and vomiting)   . Rheumatic fever 1958   hospitalized for three months  . Urinary leakage    takes Ditropan daily  . Vitamin D deficiency    takes Vit D daily    Family History  Problem Relation Age of Onset  . Diabetes Other        siblings, mother  . Lung disease Father        black lung, died young  . Heart disease Brother   . Colon cancer Neg Hx     Past Surgical History:  Procedure Laterality Date  . APPENDECTOMY     incidental at time of C-section  . BACK SURGERY  1986  . cataract surgery Bilateral   . CESAREAN SECTION  1979  . CHOLECYSTECTOMY  1982  . COLONOSCOPY  2004   Dr. Imogene Burn polyps, left sided diverticula. Path unavailable.   . COLONOSCOPY  02/12/2011   Procedure: COLONOSCOPY;  Surgeon: Daneil Dolin, MD;  [facility];  Service: Endoscopy;  Laterality: N/A;  . ESOPHAGOGASTRODUODENOSCOPY  02/12/2011   Procedure: ESOPHAGOGASTRODUODENOSCOPY (EGD);  Surgeon: Daneil Dolin, MD;  [facility];  Service: Endoscopy;  Laterality: N/A;  . GASTRIC ROUX-EN-Y N/A 06/19/2015   Procedure: LAPAROSCOPIC ROUX-EN-Y GASTRIC BYPASS WITH UPPER ENDOSCOPY;  Surgeon: Excell Seltzer, MD;  [facility];  Service: General;  Laterality: N/A;  . [place]   left  . TOTAL KNEE ARTHROPLASTY  10/2009   left  . TOTAL KNEE ARTHROPLASTY Right 11/25/2016   Procedure: RIGHT TOTAL KNEE  ARTHROPLASTY;  Surgeon: Mcarthur Rossetti, MD;  Location: [place];  Service: Orthopedics;  Laterality: Right;  . UPPER GI ENDOSCOPY  06/19/2015   Procedure: UPPER GI ENDOSCOPY;  Surgeon: Excell Seltzer, MD;  [facility];  Service: General;;   Social History   Occupational History    Employer: retired  Tobacco Use  . Smoking status: Never Smoker  . Smokeless tobacco: Never Used  Substance and Sexual Activity  . Alcohol use: No  . Drug use: No  . Sexual activity: Not on file

## 2020-08-01 ENCOUNTER — Ambulatory Visit (INDEPENDENT_AMBULATORY_CARE_PROVIDER_SITE_OTHER): Payer: Medicare Other | Admitting: Orthopaedic Surgery

## 2020-08-01 ENCOUNTER — Ambulatory Visit (INDEPENDENT_AMBULATORY_CARE_PROVIDER_SITE_OTHER): Payer: Medicare Other

## 2020-08-01 ENCOUNTER — Encounter: Payer: Self-pay | Admitting: Orthopaedic Surgery

## 2020-08-01 DIAGNOSIS — S43005A Unspecified dislocation of left shoulder joint, initial encounter: Secondary | ICD-10-CM

## 2020-08-01 DIAGNOSIS — R0781 Pleurodynia: Secondary | ICD-10-CM

## 2020-08-01 NOTE — Progress Notes (Signed)
The patient is now just over 2 weeks status post a mechanical fall in which she sustained a left shoulder dislocation.  She has been compliant with wearing a sling.  She is 79 years old.  She has been having some pain on the right side of her ribs.  She wanted her ribs x-rayed today.  We did x-ray the ribs but I do not see any fracture on my read.  Her left shoulder exam is clinically well located.  3 views left shoulder also show a well located shoulder.  At this point she can be out of her sling.  She will use her shoulder as comfort allows.  She is not interested in therapy in order think she needs it.  She just will be careful with abduction and external rotation for the next 4 to 6 weeks.  All questions and concerns were answered and addressed.  Follow-up can be as needed.

## 2020-10-01 DIAGNOSIS — I10 Essential (primary) hypertension: Secondary | ICD-10-CM | POA: Diagnosis not present

## 2020-10-01 DIAGNOSIS — K219 Gastro-esophageal reflux disease without esophagitis: Secondary | ICD-10-CM | POA: Diagnosis not present

## 2020-10-01 DIAGNOSIS — E039 Hypothyroidism, unspecified: Secondary | ICD-10-CM | POA: Diagnosis not present

## 2020-10-22 DIAGNOSIS — F411 Generalized anxiety disorder: Secondary | ICD-10-CM | POA: Diagnosis not present

## 2020-10-22 DIAGNOSIS — R32 Unspecified urinary incontinence: Secondary | ICD-10-CM | POA: Diagnosis not present

## 2020-10-22 DIAGNOSIS — E782 Mixed hyperlipidemia: Secondary | ICD-10-CM | POA: Diagnosis not present

## 2020-10-22 DIAGNOSIS — F339 Major depressive disorder, recurrent, unspecified: Secondary | ICD-10-CM | POA: Diagnosis not present

## 2020-10-22 DIAGNOSIS — R945 Abnormal results of liver function studies: Secondary | ICD-10-CM | POA: Diagnosis not present

## 2020-10-22 DIAGNOSIS — Z6833 Body mass index (BMI) 33.0-33.9, adult: Secondary | ICD-10-CM | POA: Diagnosis not present

## 2020-10-22 DIAGNOSIS — I10 Essential (primary) hypertension: Secondary | ICD-10-CM | POA: Diagnosis not present

## 2020-10-22 DIAGNOSIS — E789 Disorder of lipoprotein metabolism, unspecified: Secondary | ICD-10-CM | POA: Diagnosis not present

## 2020-10-22 DIAGNOSIS — Z6832 Body mass index (BMI) 32.0-32.9, adult: Secondary | ICD-10-CM | POA: Diagnosis not present

## 2020-10-22 DIAGNOSIS — R7301 Impaired fasting glucose: Secondary | ICD-10-CM | POA: Diagnosis not present

## 2020-10-22 DIAGNOSIS — E6609 Other obesity due to excess calories: Secondary | ICD-10-CM | POA: Diagnosis not present

## 2020-10-22 DIAGNOSIS — E039 Hypothyroidism, unspecified: Secondary | ICD-10-CM | POA: Diagnosis not present

## 2020-10-29 DIAGNOSIS — R42 Dizziness and giddiness: Secondary | ICD-10-CM | POA: Diagnosis not present

## 2020-10-29 DIAGNOSIS — R519 Headache, unspecified: Secondary | ICD-10-CM | POA: Diagnosis not present

## 2020-10-29 DIAGNOSIS — F331 Major depressive disorder, recurrent, moderate: Secondary | ICD-10-CM | POA: Diagnosis not present

## 2020-10-29 DIAGNOSIS — R011 Cardiac murmur, unspecified: Secondary | ICD-10-CM | POA: Diagnosis not present

## 2020-10-29 DIAGNOSIS — R748 Abnormal levels of other serum enzymes: Secondary | ICD-10-CM | POA: Diagnosis not present

## 2020-10-29 DIAGNOSIS — N3281 Overactive bladder: Secondary | ICD-10-CM | POA: Diagnosis not present

## 2020-10-29 DIAGNOSIS — E039 Hypothyroidism, unspecified: Secondary | ICD-10-CM | POA: Diagnosis not present

## 2020-10-29 DIAGNOSIS — W19XXXA Unspecified fall, initial encounter: Secondary | ICD-10-CM | POA: Diagnosis not present

## 2020-10-29 DIAGNOSIS — Z6833 Body mass index (BMI) 33.0-33.9, adult: Secondary | ICD-10-CM | POA: Diagnosis not present

## 2020-11-01 ENCOUNTER — Other Ambulatory Visit (HOSPITAL_COMMUNITY): Payer: Self-pay | Admitting: Internal Medicine

## 2020-11-01 ENCOUNTER — Other Ambulatory Visit: Payer: Self-pay | Admitting: Internal Medicine

## 2020-11-01 DIAGNOSIS — R519 Headache, unspecified: Secondary | ICD-10-CM

## 2020-11-13 DIAGNOSIS — Z23 Encounter for immunization: Secondary | ICD-10-CM | POA: Diagnosis not present

## 2020-11-14 ENCOUNTER — Other Ambulatory Visit: Payer: Self-pay

## 2020-11-14 ENCOUNTER — Ambulatory Visit (HOSPITAL_COMMUNITY)
Admission: RE | Admit: 2020-11-14 | Discharge: 2020-11-14 | Disposition: A | Discharge status: Home / Self Care | Payer: Medicare Other | Source: Ambulatory Visit | Attending: Internal Medicine | Admitting: Internal Medicine

## 2020-11-14 DIAGNOSIS — R519 Headache, unspecified: Secondary | ICD-10-CM | POA: Insufficient documentation

## 2021-01-01 DIAGNOSIS — K219 Gastro-esophageal reflux disease without esophagitis: Secondary | ICD-10-CM | POA: Diagnosis not present

## 2021-01-01 DIAGNOSIS — E1165 Type 2 diabetes mellitus with hyperglycemia: Secondary | ICD-10-CM | POA: Diagnosis not present

## 2021-02-05 DIAGNOSIS — Z20822 Contact with and (suspected) exposure to covid-19: Secondary | ICD-10-CM | POA: Diagnosis not present

## 2021-03-02 DIAGNOSIS — M542 Cervicalgia: Secondary | ICD-10-CM | POA: Diagnosis not present

## 2021-03-02 DIAGNOSIS — K921 Melena: Secondary | ICD-10-CM | POA: Diagnosis not present

## 2021-05-01 DIAGNOSIS — R7301 Impaired fasting glucose: Secondary | ICD-10-CM | POA: Diagnosis not present

## 2021-05-01 DIAGNOSIS — E039 Hypothyroidism, unspecified: Secondary | ICD-10-CM | POA: Diagnosis not present

## 2021-05-01 DIAGNOSIS — E782 Mixed hyperlipidemia: Secondary | ICD-10-CM | POA: Diagnosis not present

## 2021-05-03 DIAGNOSIS — Z23 Encounter for immunization: Secondary | ICD-10-CM | POA: Diagnosis not present

## 2021-05-10 DIAGNOSIS — Z23 Encounter for immunization: Secondary | ICD-10-CM | POA: Diagnosis not present

## 2021-05-10 DIAGNOSIS — R011 Cardiac murmur, unspecified: Secondary | ICD-10-CM | POA: Diagnosis not present

## 2021-05-10 DIAGNOSIS — N3281 Overactive bladder: Secondary | ICD-10-CM | POA: Diagnosis not present

## 2021-05-10 DIAGNOSIS — F331 Major depressive disorder, recurrent, moderate: Secondary | ICD-10-CM | POA: Diagnosis not present

## 2021-05-10 DIAGNOSIS — Z6833 Body mass index (BMI) 33.0-33.9, adult: Secondary | ICD-10-CM | POA: Diagnosis not present

## 2021-05-10 DIAGNOSIS — R748 Abnormal levels of other serum enzymes: Secondary | ICD-10-CM | POA: Diagnosis not present

## 2021-05-10 DIAGNOSIS — F429 Obsessive-compulsive disorder, unspecified: Secondary | ICD-10-CM | POA: Diagnosis not present

## 2021-05-10 DIAGNOSIS — E039 Hypothyroidism, unspecified: Secondary | ICD-10-CM | POA: Diagnosis not present

## 2021-05-10 DIAGNOSIS — Z0001 Encounter for general adult medical examination with abnormal findings: Secondary | ICD-10-CM | POA: Diagnosis not present

## 2021-05-10 DIAGNOSIS — R42 Dizziness and giddiness: Secondary | ICD-10-CM | POA: Diagnosis not present

## 2021-05-13 DIAGNOSIS — Z01419 Encounter for gynecological examination (general) (routine) without abnormal findings: Secondary | ICD-10-CM | POA: Diagnosis not present

## 2021-05-13 DIAGNOSIS — L900 Lichen sclerosus et atrophicus: Secondary | ICD-10-CM | POA: Diagnosis not present

## 2021-05-13 DIAGNOSIS — R32 Unspecified urinary incontinence: Secondary | ICD-10-CM | POA: Diagnosis not present

## 2021-05-13 DIAGNOSIS — Z6834 Body mass index (BMI) 34.0-34.9, adult: Secondary | ICD-10-CM | POA: Diagnosis not present

## 2021-05-13 DIAGNOSIS — Z1231 Encounter for screening mammogram for malignant neoplasm of breast: Secondary | ICD-10-CM | POA: Diagnosis not present

## 2021-05-13 DIAGNOSIS — N952 Postmenopausal atrophic vaginitis: Secondary | ICD-10-CM | POA: Diagnosis not present

## 2021-06-24 DIAGNOSIS — R319 Hematuria, unspecified: Secondary | ICD-10-CM | POA: Diagnosis not present

## 2021-06-24 DIAGNOSIS — H43393 Other vitreous opacities, bilateral: Secondary | ICD-10-CM | POA: Diagnosis not present

## 2021-07-03 DIAGNOSIS — I10 Essential (primary) hypertension: Secondary | ICD-10-CM | POA: Diagnosis not present

## 2021-07-03 DIAGNOSIS — E782 Mixed hyperlipidemia: Secondary | ICD-10-CM | POA: Diagnosis not present

## 2021-07-05 DIAGNOSIS — N3946 Mixed incontinence: Secondary | ICD-10-CM | POA: Diagnosis not present

## 2021-07-05 DIAGNOSIS — R310 Gross hematuria: Secondary | ICD-10-CM | POA: Diagnosis not present

## 2021-07-05 DIAGNOSIS — N3942 Incontinence without sensory awareness: Secondary | ICD-10-CM | POA: Diagnosis not present

## 2021-07-11 DIAGNOSIS — R310 Gross hematuria: Secondary | ICD-10-CM | POA: Diagnosis not present

## 2021-07-11 DIAGNOSIS — K5730 Diverticulosis of large intestine without perforation or abscess without bleeding: Secondary | ICD-10-CM | POA: Diagnosis not present

## 2021-07-11 DIAGNOSIS — N200 Calculus of kidney: Secondary | ICD-10-CM | POA: Diagnosis not present

## 2021-07-11 DIAGNOSIS — N281 Cyst of kidney, acquired: Secondary | ICD-10-CM | POA: Diagnosis not present

## 2021-07-12 DIAGNOSIS — R310 Gross hematuria: Secondary | ICD-10-CM | POA: Diagnosis not present

## 2021-07-12 DIAGNOSIS — N3946 Mixed incontinence: Secondary | ICD-10-CM | POA: Diagnosis not present

## 2021-07-28 ENCOUNTER — Encounter (HOSPITAL_COMMUNITY): Payer: Self-pay

## 2021-07-28 ENCOUNTER — Encounter (HOSPITAL_COMMUNITY): Payer: Self-pay | Admitting: Emergency Medicine

## 2021-07-28 ENCOUNTER — Emergency Department (HOSPITAL_COMMUNITY)
Admission: EM | Admit: 2021-07-28 | Discharge: 2021-07-28 | Disposition: A | Discharge status: Home / Self Care | Payer: Medicare Other | Source: Home / Self Care | Attending: Emergency Medicine | Admitting: Emergency Medicine

## 2021-07-28 ENCOUNTER — Emergency Department (HOSPITAL_COMMUNITY): Payer: Medicare Other

## 2021-07-28 ENCOUNTER — Emergency Department (HOSPITAL_COMMUNITY)
Admission: EM | Admit: 2021-07-28 | Discharge: 2021-07-28 | Disposition: A | Discharge status: Home / Self Care | Payer: Medicare Other | Attending: Emergency Medicine | Admitting: Emergency Medicine

## 2021-07-28 ENCOUNTER — Other Ambulatory Visit: Payer: Self-pay

## 2021-07-28 DIAGNOSIS — Z79899 Other long term (current) drug therapy: Secondary | ICD-10-CM | POA: Insufficient documentation

## 2021-07-28 DIAGNOSIS — Z7982 Long term (current) use of aspirin: Secondary | ICD-10-CM | POA: Insufficient documentation

## 2021-07-28 DIAGNOSIS — E039 Hypothyroidism, unspecified: Secondary | ICD-10-CM | POA: Insufficient documentation

## 2021-07-28 DIAGNOSIS — R109 Unspecified abdominal pain: Secondary | ICD-10-CM

## 2021-07-28 DIAGNOSIS — N200 Calculus of kidney: Secondary | ICD-10-CM | POA: Insufficient documentation

## 2021-07-28 DIAGNOSIS — Z96653 Presence of artificial knee joint, bilateral: Secondary | ICD-10-CM | POA: Insufficient documentation

## 2021-07-28 DIAGNOSIS — K219 Gastro-esophageal reflux disease without esophagitis: Secondary | ICD-10-CM | POA: Diagnosis not present

## 2021-07-28 DIAGNOSIS — I10 Essential (primary) hypertension: Secondary | ICD-10-CM | POA: Diagnosis not present

## 2021-07-28 DIAGNOSIS — R8271 Bacteriuria: Secondary | ICD-10-CM | POA: Insufficient documentation

## 2021-07-28 DIAGNOSIS — N23 Unspecified renal colic: Secondary | ICD-10-CM

## 2021-07-28 DIAGNOSIS — N1330 Unspecified hydronephrosis: Secondary | ICD-10-CM | POA: Diagnosis not present

## 2021-07-28 DIAGNOSIS — K429 Umbilical hernia without obstruction or gangrene: Secondary | ICD-10-CM | POA: Diagnosis not present

## 2021-07-28 DIAGNOSIS — N261 Atrophy of kidney (terminal): Secondary | ICD-10-CM | POA: Diagnosis not present

## 2021-07-28 LAB — CBC WITH DIFFERENTIAL/PLATELET
Abs Immature Granulocytes: 0.02 10*3/uL (ref 0.00–0.07)
Abs Immature Granulocytes: 0.02 10*3/uL (ref 0.00–0.07)
Basophils Absolute: 0 10*3/uL (ref 0.0–0.1)
Basophils Absolute: 0 10*3/uL (ref 0.0–0.1)
Basophils Relative: 0 %
Basophils Relative: 0 %
Eosinophils Absolute: 0 10*3/uL (ref 0.0–0.5)
Eosinophils Absolute: 0 10*3/uL (ref 0.0–0.5)
Eosinophils Relative: 0 %
Eosinophils Relative: 0 %
HCT: 43.1 % (ref 36.0–46.0)
HCT: 43.8 % (ref 36.0–46.0)
Hemoglobin: 14.3 g/dL (ref 12.0–15.0)
Hemoglobin: 14.4 g/dL (ref 12.0–15.0)
Immature Granulocytes: 0 %
Immature Granulocytes: 0 %
Lymphocytes Relative: 16 %
Lymphocytes Relative: 11 %
Lymphs Abs: 1.1 10*3/uL (ref 0.7–4.0)
Lymphs Abs: 0.7 10*3/uL (ref 0.7–4.0)
MCH: 32.1 pg (ref 26.0–34.0)
MCH: 31.5 pg (ref 26.0–34.0)
MCHC: 33.2 g/dL (ref 30.0–36.0)
MCHC: 32.9 g/dL (ref 30.0–36.0)
MCV: 96.6 fL (ref 80.0–100.0)
MCV: 95.8 fL (ref 80.0–100.0)
Monocytes Absolute: 0.3 10*3/uL (ref 0.1–1.0)
Monocytes Absolute: 0.4 10*3/uL (ref 0.1–1.0)
Monocytes Relative: 4 %
Monocytes Relative: 6 %
Neutro Abs: 5.5 10*3/uL (ref 1.7–7.7)
Neutro Abs: 5.9 10*3/uL (ref 1.7–7.7)
Neutrophils Relative %: 80 %
Neutrophils Relative %: 83 %
Platelets: 165 10*3/uL (ref 150–400)
Platelets: 175 10*3/uL (ref 150–400)
RBC: 4.46 MIL/uL (ref 3.87–5.11)
RBC: 4.57 MIL/uL (ref 3.87–5.11)
RDW: 13.2 % (ref 11.5–15.5)
RDW: 13.4 % (ref 11.5–15.5)
WBC: 7 10*3/uL (ref 4.0–10.5)
WBC: 7.1 10*3/uL (ref 4.0–10.5)
nRBC: 0 % (ref 0.0–0.2)
nRBC: 0 % (ref 0.0–0.2)

## 2021-07-28 LAB — URINALYSIS, ROUTINE W REFLEX MICROSCOPIC
Bilirubin Urine: NEGATIVE
Glucose, UA: NEGATIVE mg/dL
Ketones, ur: NEGATIVE mg/dL
Leukocytes,Ua: NEGATIVE
Nitrite: NEGATIVE
Protein, ur: 30 mg/dL — AB
Specific Gravity, Urine: 1.025 (ref 1.005–1.030)
pH: 6 (ref 5.0–8.0)

## 2021-07-28 LAB — I-STAT CHEM 8, ED
BUN: 14 mg/dL (ref 8–23)
Calcium, Ion: 1.11 mmol/L — ABNORMAL LOW (ref 1.15–1.40)
Chloride: 106 mmol/L (ref 98–111)
Creatinine, Ser: 1 mg/dL (ref 0.44–1.00)
Glucose, Bld: 137 mg/dL — ABNORMAL HIGH (ref 70–99)
HCT: 44 % (ref 36.0–46.0)
Hemoglobin: 15 g/dL (ref 12.0–15.0)
Potassium: 4.1 mmol/L (ref 3.5–5.1)
Sodium: 140 mmol/L (ref 135–145)
TCO2: 26 mmol/L (ref 22–32)

## 2021-07-28 LAB — COMPREHENSIVE METABOLIC PANEL
ALT: 18 U/L (ref 0–44)
AST: 24 U/L (ref 15–41)
Albumin: 4.1 g/dL (ref 3.5–5.0)
Alkaline Phosphatase: 117 U/L (ref 38–126)
Anion gap: 13 (ref 5–15)
BUN: 13 mg/dL (ref 8–23)
CO2: 20 mmol/L — ABNORMAL LOW (ref 22–32)
Calcium: 8.9 mg/dL (ref 8.9–10.3)
Chloride: 106 mmol/L (ref 98–111)
Creatinine, Ser: 0.71 mg/dL (ref 0.44–1.00)
GFR, Estimated: >60 mL/min (ref >60)
Glucose, Bld: 137 mg/dL — ABNORMAL HIGH (ref 70–99)
Potassium: 3.9 mmol/L (ref 3.5–5.1)
Sodium: 139 mmol/L (ref 135–145)
Total Bilirubin: 0.6 mg/dL (ref 0.3–1.2)
Total Protein: 6.9 g/dL (ref 6.5–8.1)

## 2021-07-28 LAB — BASIC METABOLIC PANEL
Anion gap: 10 (ref 5–15)
BUN: 15 mg/dL (ref 8–23)
CO2: 23 mmol/L (ref 22–32)
Calcium: 9.3 mg/dL (ref 8.9–10.3)
Chloride: 105 mmol/L (ref 98–111)
Creatinine, Ser: 0.91 mg/dL (ref 0.44–1.00)
GFR, Estimated: >60 mL/min (ref >60)
Glucose, Bld: 142 mg/dL — ABNORMAL HIGH (ref 70–99)
Potassium: 4.1 mmol/L (ref 3.5–5.1)
Sodium: 138 mmol/L (ref 135–145)

## 2021-07-28 LAB — URINALYSIS, MICROSCOPIC (REFLEX)

## 2021-07-28 MED ORDER — KETOROLAC TROMETHAMINE 30 MG/ML IJ SOLN
15.0000 mg | Freq: Once | INTRAMUSCULAR | Status: AC
  Administered 2021-07-28: 10:00:00 15 mg via INTRAVENOUS
  Filled 2021-07-28: qty 1

## 2021-07-28 MED ORDER — HYDROMORPHONE HCL 1 MG/ML IJ SOLN
1.0000 mg | Freq: Once | INTRAMUSCULAR | Status: AC
  Administered 2021-07-28: 10:00:00 1 mg via INTRAVENOUS
  Filled 2021-07-28: qty 1

## 2021-07-28 MED ORDER — SODIUM CHLORIDE 0.9 % IV SOLN
2.0000 g | Freq: Once | INTRAVENOUS | Status: AC
  Administered 2021-07-28: 11:00:00 2 g via INTRAVENOUS
  Filled 2021-07-28: qty 20

## 2021-07-28 MED ORDER — ONDANSETRON HCL 4 MG/2ML IJ SOLN
4.0000 mg | Freq: Once | INTRAMUSCULAR | Status: AC
  Administered 2021-07-28: 10:00:00 4 mg via INTRAVENOUS
  Filled 2021-07-28: qty 2

## 2021-07-28 MED ORDER — HYDRALAZINE HCL 20 MG/ML IJ SOLN
5.0000 mg | Freq: Once | INTRAMUSCULAR | Status: AC
  Administered 2021-07-28: 21:00:00 5 mg via INTRAVENOUS
  Filled 2021-07-28: qty 1

## 2021-07-28 MED ORDER — CEPHALEXIN 500 MG PO CAPS
500.0000 mg | ORAL_CAPSULE | Freq: Four times a day (QID) | ORAL | 0 refills | Status: DC
Start: 2021-07-28 — End: 2021-07-28

## 2021-07-28 MED ORDER — OXYCODONE-ACETAMINOPHEN 5-325 MG PO TABS: 1.0000 | ORAL_TABLET | Freq: Four times a day (QID) | ORAL | 0 refills | Status: DC | PRN

## 2021-07-28 MED ORDER — HYDROMORPHONE HCL 1 MG/ML IJ SOLN
1.0000 mg | Freq: Once | INTRAMUSCULAR | Status: AC
  Administered 2021-07-28: 21:00:00 1 mg via INTRAVENOUS
  Filled 2021-07-28: qty 1

## 2021-07-28 MED ORDER — CEPHALEXIN 500 MG PO CAPS: 500.0000 mg | ORAL_CAPSULE | Freq: Three times a day (TID) | ORAL | 0 refills | Status: DC

## 2021-07-28 MED ORDER — ONDANSETRON HCL 4 MG/2ML IJ SOLN
4.0000 mg | Freq: Once | INTRAMUSCULAR | Status: AC
  Administered 2021-07-28: 21:00:00 4 mg via INTRAVENOUS
  Filled 2021-07-28: qty 2

## 2021-07-28 MED ORDER — KETOROLAC TROMETHAMINE 30 MG/ML IJ SOLN
30.0000 mg | Freq: Once | INTRAMUSCULAR | Status: AC
  Administered 2021-07-28: 21:00:00 30 mg via INTRAVENOUS
  Filled 2021-07-28: qty 1

## 2021-07-28 MED ORDER — OXYCODONE-ACETAMINOPHEN 5-325 MG PO TABS: 2.0000 | ORAL_TABLET | Freq: Four times a day (QID) | ORAL | 0 refills | Status: DC | PRN

## 2021-07-28 NOTE — Discharge Instructions (Addendum)
Please pick up some Percocet on the way home.  Take Percocet when you get home and take it every 6 hours.  Take Keflex as prescribed as well.    Please call urology tomorrow to get an appointment  Return to ER if you have worse abdominal pain, vomiting, fever.

## 2021-07-28 NOTE — ED Triage Notes (Addendum)
Pt was diagnosed with kidney stones today and is having a hard time managing her pain. Pt is having right sided flank pain.

## 2021-07-28 NOTE — ED Provider Notes (Signed)
[facility] DEPT Provider Note   CSN: [id number] Arrival date & time: 07/28/21  2034     History Chief Complaint  Patient presents with   Nephrolithiasis    [name] is a 80 y.o. female hx of constipation, recurrent kidney stone here presenting with right flank pain.  Patient was seen earlier today for renal colic.  She had a CT that showed 1.7 cm UVJ stone.  Patient also has bacteria in his urine.  Patient's pain was under control when she was in the ER.  Patient went home and around 6 PM, she had worsening right lower quadrant pain.  She states that this is the exact same pain as this morning.  She states that she was prescribed pain medicine but she was unable to fill the prescription because the pharmacy was closed.  Denies any nausea or vomiting.  She was noted to be hypertensive in the ED.  She was also hypertensive this morning.  The history is provided by the patient.      Past Medical History:  Diagnosis Date   Allergy    takes Claritin daily   Arthritis    Complication of anesthesia    Constipation    takes Senokot daily   Depression    takes Lexapro daily   Family history of adverse reaction to anesthesia    sister has a hard time waking up   GERD (gastroesophageal reflux disease)    History of blood clots 1979   after C/S clot went to lung   History of bronchitis    3 yrs ago   History of migraine    Hypothyroidism    takes Synthroid daily   Joint pain    Joint swelling    Personal history of colonic polyps    benign   PONV (postoperative nausea and vomiting)    Rheumatic fever 1958   hospitalized for three months   Urinary leakage    takes Ditropan daily   Vitamin D deficiency    takes Vit D daily    Patient Active Problem List   Diagnosis Date Noted   Thyroid dysfunction 02/25/2019   Overactive bladder 57/32/2025   Lichen sclerosus of female genitalia 02/25/2019   Status post total right knee replacement  11/25/2016   Unilateral primary osteoarthritis, right knee 10/22/2016   Trigger thumb, left thumb 10/22/2016   Morbid obesity ([place]) 06/19/2015   GERD (gastroesophageal reflux disease) 01/20/2011   Colon polyp 01/20/2011   Hemorrhoids 01/20/2011    Past Surgical History:  Procedure Laterality Date   APPENDECTOMY     incidental at time of C-section   [place]   cataract surgery Bilateral    CESAREAN SECTION  1979   CHOLECYSTECTOMY  1982   COLONOSCOPY  2004   Dr. Imogene Burn polyps, left sided diverticula. Path unavailable.    COLONOSCOPY  02/12/2011   Procedure: COLONOSCOPY;  Surgeon: Daneil Dolin, MD;  Location: [facility];  Service: Endoscopy;  Laterality: N/A;   ESOPHAGOGASTRODUODENOSCOPY  02/12/2011   Procedure: ESOPHAGOGASTRODUODENOSCOPY (EGD);  Surgeon: Daneil Dolin, MD;  Location: [facility];  Service: Endoscopy;  Laterality: N/A;   GASTRIC ROUX-EN-Y N/A 06/19/2015   Procedure: LAPAROSCOPIC ROUX-EN-Y GASTRIC BYPASS WITH UPPER ENDOSCOPY;  Surgeon: Excell Seltzer, MD;  Location: [facility];  Service: General;  Laterality: N/A;   KNEE SURGERY  1999   left   TOTAL KNEE ARTHROPLASTY  10/2009   left   TOTAL KNEE ARTHROPLASTY Right  11/25/2016   Procedure: RIGHT TOTAL KNEE ARTHROPLASTY;  Surgeon: Mcarthur Rossetti, MD;  Location: [facility];  Service: Orthopedics;  Laterality: Right;   UPPER GI ENDOSCOPY  06/19/2015   Procedure: UPPER GI ENDOSCOPY;  Surgeon: Excell Seltzer, MD;  Location: [facility];  Service: General;;     OB History   No obstetric history on file.     Family History  Problem Relation Age of Onset   Diabetes Other        siblings, mother   Lung disease Father        black lung, died young   Heart disease Brother    Colon cancer Neg Hx     Social History   Tobacco Use   Smoking status: Never   Smokeless tobacco: Never  Vaping Use   Vaping Use: Never used  Substance Use Topics   Alcohol use: No   Drug use: No    Home  Medications Prior to Admission medications   Medication Sig Start Date End Date Taking? Authorizing Provider  acetaminophen (TYLENOL) 325 MG tablet Take 325 mg by mouth every 6 (six) hours as needed for mild pain or headache.    [provider]  Ascorbic Acid (VITAMIN C) 500 MG CHEW Chew 500 mg by mouth 2 (two) times daily.    [provider]  aspirin EC 325 MG EC tablet Take 1 tablet (325 mg total) by mouth 2 (two) times daily after a meal. 11/27/16   Mcarthur Rossetti, MD  betamethasone valerate (VALISONE) 0.1 % cream  02/23/18   [provider]  Calcium 600-400 MG-UNIT CHEW Chew 1 tablet by mouth 3 (three) times daily.    [provider]  Calcium Polycarbophil (FIBERCON PO) Take 1 tablet by mouth at bedtime.    [provider]  cephALEXin (KEFLEX) 500 MG capsule Take 1 capsule (500 mg total) by mouth 3 (three) times daily. 07/28/21   Drenda Freeze, MD  cholecalciferol (VITAMIN D) 1000 units tablet Take 1,000 Units by mouth daily.    [provider]  Cyanocobalamin (VITAMIN B-12) 2500 MCG SUBL Place 2,500 mcg under the tongue daily.     [provider]  escitalopram (LEXAPRO) 20 MG tablet Take 20 mg by mouth daily.      [provider]  Influenza vac split quadrivalent PF (FLUZONE HIGH-DOSE) 0.5 ML injection Fluzone High-Dose 2019-20 (PF) 180 mcg/0.5 mL intramuscular syringe  PHARMACIST ADMINISTERED IMMUNIZATION ADMINISTERED AT TIME OF DISPENSING    [provider]  levothyroxine (SYNTHROID) 125 MCG tablet levothyroxine 125 mcg tablet    [provider]  loratadine (CLARITIN) 10 MG tablet Take 10 mg by mouth daily.    [provider]  Multiple Vitamin (MULTIVITAMIN WITH MINERALS) TABS tablet Take 1 tablet by mouth 2 (two) times daily.     [provider]  oxybutynin (DITROPAN) 5 MG tablet Take 5 mg by mouth every evening.  05/09/13   [provider]  oxybutynin  (DITROPAN-XL) 10 MG 24 hr tablet oxybutynin chloride ER 10 mg tablet,extended release 24 hr    [provider]  oxyCODONE-acetaminophen (PERCOCET) 5-325 MG tablet Take 1 tablet by mouth every 6 (six) hours as needed. 07/28/21   Drenda Freeze, MD  pneumococcal 13-valent conjugate vaccine (PREVNAR 13) SUSP injection Prevnar 13 (PF) 0.5 mL intramuscular syringe  PHARMACIST ADMINISTERED IMMUNIZATION ADMINISTERED AT TIME OF DISPENSING    [provider]  predniSONE (DELTASONE) 10 MG tablet prednisone 10 mg tablet  [provider]  sennosides-docusate sodium (SENOKOT-S) 8.6-50 MG tablet Take 2 tablets by mouth at bedtime.     [provider]    Allergies    No known allergies  Review of Systems   Review of Systems  Gastrointestinal:  Positive for abdominal pain.  All other systems reviewed and are negative.  Physical Exam Updated Vital Signs BP (!) 142/67    Pulse 74    Temp (!) 97.4 F (36.3 C) (Oral)    Resp 18    Ht 5\' 8"  (1.727 m)    Wt 90.7 kg    SpO2 95%    BMI 30.41 kg/m   Physical Exam Vitals and nursing note reviewed.  Constitutional:      Comments: Uncomfortable  HENT:     Head: Normocephalic.     Nose: Nose normal.     Mouth/Throat:     Mouth: Mucous membranes are moist.  Eyes:     Extraocular Movements: Extraocular movements intact.     Pupils: Pupils are equal, round, and reactive to light.  Cardiovascular:     Rate and Rhythm: Normal rate and regular rhythm.     Pulses: Normal pulses.     Heart sounds: Normal heart sounds.  Pulmonary:     Effort: Pulmonary effort is normal.     Breath sounds: Normal breath sounds.  Abdominal:     General: Abdomen is flat.     Comments: Right lower quadrant tenderness, mild right CVAT  Musculoskeletal:        General: Normal range of motion.     Cervical back: Normal range of motion and neck supple.  Skin:    General: Skin is warm.     Capillary Refill: Capillary refill takes less than  2 seconds.  Neurological:     General: No focal deficit present.     Mental Status: She is oriented to person, place, and time.  Psychiatric:        Mood and Affect: Mood normal.        Behavior: Behavior normal.    ED Results / Procedures / Treatments   Labs (all labs ordered are listed, but only abnormal results are displayed) Labs Reviewed  BASIC METABOLIC PANEL - Abnormal; Notable for the following components:      Result Value   Glucose, Bld 142 (*)    All other components within normal limits  I-STAT CHEM 8, ED - Abnormal; Notable for the following components:   Glucose, Bld 137 (*)    Calcium, Ion 1.11 (*)    All other components within normal limits  CBC WITH DIFFERENTIAL/PLATELET  URINALYSIS, ROUTINE W REFLEX MICROSCOPIC    EKG None  Radiology DG Abd Portable 1 View  Result Date: 07/28/2021 CLINICAL DATA:  Abdominal pain. EXAM: PORTABLE ABDOMEN - 1 VIEW COMPARISON:  CT dated 07/28/2021. FINDINGS: Evaluation is limited due to body habitus. There is a 15 mm stone in the proximal right ureter. Additional smaller stone noted in the inferior pole of the right kidney. No bowel dilatation or evidence of obstruction. Degenerative changes of the spine. No acute osseous pathology. IMPRESSION: Right renal and proximal right ureteral calculi in similar location as the CT. Electronically Signed   By: Anner Crete M.D.   On: 07/28/2021 21:47   CT Renal Stone Study  Result Date: 07/28/2021 CLINICAL DATA:  Flank pain.  Evaluate for nephrolithiasis. EXAM: CT ABDOMEN AND PELVIS WITHOUT CONTRAST TECHNIQUE: Multidetector CT imaging of the abdomen and pelvis was performed  following the standard protocol without IV contrast. COMPARISON:  07/11/2021 FINDINGS: The lack of intravenous contrast limits the ability to evaluate solid abdominal organs. 1 Lower chest: Limited visualization of the lower thorax demonstrates minimal left basilar subsegmental atelectasis, similar to the 07/11/2021  examination. No discrete focal airspace opacities. No pleural effusion. Normal heart size. Suspected coronary artery calcifications. No pericardial effusion. Hepatobiliary: Normal hepatic contour. Post cholecystectomy. No ascites. Pancreas: Normal noncontrast appearance of the pancreas. Spleen: Normal noncontrast appearance of the spleen. Adrenals/Urinary Tract: Interval enlargement and displacement of the now approximately 1.7 x 1.2 x 0.9 cm right-sided renal stone (axial image 33, series 2; coronal image 53, series 5), previously, 1.4 x 1.1 x 0.8 cm, now located at the level of the right UPJ with associated slight worsening of mild right-sided pelvicaliectasis and associated asymmetric perinephric stranding (axial image 33, series 2; coronal image 53, series 5). Interval progression of nonobstructing right-sided nephro lithiasis stone burden with progression of the now approximately 0.8 cm slightly fragmented stone within the inferior pole the right kidney (coronal image 64, series 5), previously, 0.5 cm as well as development of a new approximate 0.7 cm nonobstructing stone within the inferior pole the right kidney (image 59, series 5), not seen on recent examination performed 07/11/2021. No evidence of left-sided nephrolithiasis. The left kidney remains slightly atrophic in comparison to the right. No evidence of left-sided urinary obstruction. Normal noncontrast appearance of the bilateral adrenal glands. Normal noncontrast appearance of the urinary bladder given degree of distention. Stomach/Bowel: Postgastric bypass with moderate-sized hiatal hernia. No evidence of enteric obstruction. Scattered colonic diverticulosis without evidence of superimposed acute diverticulitis. Normal appearance of the terminal ileum. The appendix is not visualized, however there is no pericecal inflammatory change. Vascular/Lymphatic: Scattered atherosclerotic plaque within a normal caliber abdominal aorta. No bulky  retroperitoneal, mesenteric, pelvic or inguinal lymphadenopathy. Reproductive: Normal noncontrast appearance of the pelvic organs for age. No free fluid the pelvic cul-de-sac. Other: Tiny mesenteric fat containing periumbilical hernia. There is a minimal amount of subcutaneous edema about the midline of the low back. Musculoskeletal: No acute or aggressive osseous abnormalities. Moderate to severe multilevel lumbar spine DDD, worse at T12-L1, L4-L5 and L5-S1 with disc space height loss, endplate irregularity and sclerosis. Mild-to-moderate degenerative change the bilateral hips with joint space loss, subchondral sclerosis and osteophytosis. IMPRESSION: 1. Interval increase in size and migration of the now approximally 1.7 cm right-sided renal stone, previously, 1.4 cm, now located at the level of the right UPJ with associated mild upstream ureterectasis, pelvicaliectasis and asymmetric right-sided perinephric stranding. 2. Interval progression of additional nonobstructing right-sided renal stones as detailed above. 3. No evidence of left-sided nephrolithiasis or urinary obstruction. 4. The left kidney remains slightly atrophic in comparison to the right, unchanged. 5. Sequela of previous gastric bypass surgery without evidence of enteric obstruction. 6.  Aortic Atherosclerosis (ICD10-I70.0). Electronically Signed   By: Sandi Mariscal M.D.   On: 07/28/2021 10:20    Procedures Procedures   Medications Ordered in ED Medications  HYDROmorphone (DILAUDID) injection 1 mg (1 mg Intravenous Given 07/28/21 2111)  ondansetron (ZOFRAN) injection 4 mg (4 mg Intravenous Given 07/28/21 2111)  ketorolac (TORADOL) 30 MG/ML injection 30 mg (30 mg Intravenous Given 07/28/21 2111)  hydrALAZINE (APRESOLINE) injection 5 mg (5 mg Intravenous Given 07/28/21 2112)    ED Course  I have reviewed the triage vital signs and the nursing notes.  Pertinent labs & imaging results that were available during my care of the patient were  reviewed  by me and considered in my medical decision making (see chart for details).    MDM Rules/Calculators/A&P                         SIERAH LACEWELL is a 80 y.o. female here with right lower quadrant pain and hypertension.  Patient was just seen this morning and had 1.7 cm stone with hydro. Patient also has bacteria in her urine.  Patient is afebrile.  We will repeat CBC and BMP.  We will get a KUB as well.  Will give pain medicine and reassess  10:41 PM Patient's blood pressure is down to 140 from 200.  Patient's pain is under control now.  Her white count remains normal.  Her kidney function is normal.  KUB showed that stones are grossly in the same location.  I discussed case with Dr. Lucia Gaskins from urology.  He states that he remember her from this morning.  He states that since patient's pain is under control, she can be managed outpatient.  I prescribed her Percocet and Keflex to [facility] pharmacy.  I told daughter to pick it up on the way home.  She has urology follow-up next week already. Stable for discharge and gave strict return precautions.       Final Clinical Impression(s) / ED Diagnoses Final diagnoses:  Renal colic on right side  Bacteriuria    Rx / DC Orders ED Discharge Orders          Ordered    cephALEXin (KEFLEX) 500 MG capsule  3 times daily        07/28/21 2231    oxyCODONE-acetaminophen (PERCOCET) 5-325 MG tablet  Every 6 hours PRN        07/28/21 2231             Drenda Freeze, MD 07/28/21 2241

## 2021-07-28 NOTE — Discharge Instructions (Signed)
Call Tuesday to get an appointment with your urologist this week.  If you have a problem today or tomorrow go to [facility] in [place]

## 2021-07-28 NOTE — ED Provider Notes (Signed)
[facility] EMERGENCY DEPARTMENT Provider Note   CSN: [id number] Arrival date & time: 07/28/21  6213     History Chief Complaint  Patient presents with   Flank Pain    [name] is a 80 y.o. female.  Patient complains of right flank pain.  Patient has history of kidney stone.  She woke this morning with severe pain  The history is provided by the patient and medical records. No language interpreter was used.  Flank Pain This is a new problem. The current episode started 12 to 24 hours ago. The problem occurs constantly. The problem has not changed since onset.Pertinent negatives include no chest pain, no abdominal pain and no headaches. Nothing aggravates the symptoms. Nothing relieves the symptoms. She has tried nothing for the symptoms. The treatment provided no relief.      Past Medical History:  Diagnosis Date   Allergy    takes Claritin daily   Arthritis    Complication of anesthesia    Constipation    takes Senokot daily   Depression    takes Lexapro daily   Family history of adverse reaction to anesthesia    sister has a hard time waking up   GERD (gastroesophageal reflux disease)    History of blood clots 1979   after C/S clot went to lung   History of bronchitis    3 yrs ago   History of migraine    Hypothyroidism    takes Synthroid daily   Joint pain    Joint swelling    Personal history of colonic polyps    benign   PONV (postoperative nausea and vomiting)    Rheumatic fever 1958   hospitalized for three months   Urinary leakage    takes Ditropan daily   Vitamin D deficiency    takes Vit D daily    Patient Active Problem List   Diagnosis Date Noted   Thyroid dysfunction 02/25/2019   Overactive bladder 08/65/7846   Lichen sclerosus of female genitalia 02/25/2019   Status post total right knee replacement 11/25/2016   Unilateral primary osteoarthritis, right knee 10/22/2016   Trigger thumb, left thumb 10/22/2016   Morbid obesity ([place])  06/19/2015   GERD (gastroesophageal reflux disease) 01/20/2011   Colon polyp 01/20/2011   Hemorrhoids 01/20/2011    Past Surgical History:  Procedure Laterality Date   APPENDECTOMY     incidental at time of C-section   [place]   cataract surgery Bilateral    CESAREAN SECTION  1979   CHOLECYSTECTOMY  1982   COLONOSCOPY  2004   Dr. Imogene Burn polyps, left sided diverticula. Path unavailable.    COLONOSCOPY  02/12/2011   Procedure: COLONOSCOPY;  Surgeon: Daneil Dolin, MD;  Location: [facility];  Service: Endoscopy;  Laterality: N/A;   ESOPHAGOGASTRODUODENOSCOPY  02/12/2011   Procedure: ESOPHAGOGASTRODUODENOSCOPY (EGD);  Surgeon: Daneil Dolin, MD;  Location: [facility];  Service: Endoscopy;  Laterality: N/A;   GASTRIC ROUX-EN-Y N/A 06/19/2015   Procedure: LAPAROSCOPIC ROUX-EN-Y GASTRIC BYPASS WITH UPPER ENDOSCOPY;  Surgeon: Excell Seltzer, MD;  Location: [facility];  Service: General;  Laterality: N/A;   KNEE SURGERY  1999   left   TOTAL KNEE ARTHROPLASTY  10/2009   left   TOTAL KNEE ARTHROPLASTY Right 11/25/2016   Procedure: RIGHT TOTAL KNEE ARTHROPLASTY;  Surgeon: Mcarthur Rossetti, MD;  Location: [place];  Service: Orthopedics;  Laterality: Right;   UPPER GI ENDOSCOPY  06/19/2015   Procedure: UPPER GI  ENDOSCOPY;  Surgeon: Excell Seltzer, MD;  Location: [facility];  Service: General;;     OB History   No obstetric history on file.     Family History  Problem Relation Age of Onset   Diabetes Other        siblings, mother   Lung disease Father        black lung, died young   Heart disease Brother    Colon cancer Neg Hx     Social History   Tobacco Use   Smoking status: Never   Smokeless tobacco: Never  Vaping Use   Vaping Use: Never used  Substance Use Topics   Alcohol use: No   Drug use: No    Home Medications Prior to Admission medications   Medication Sig Start Date End Date Taking? Authorizing Provider  cephALEXin (KEFLEX) 500 MG  capsule Take 1 capsule (500 mg total) by mouth 4 (four) times daily. 07/28/21  Yes Milton Ferguson, MD  oxyCODONE-acetaminophen (PERCOCET) 5-325 MG tablet Take 2 tablets by mouth every 6 (six) hours as needed. 07/28/21  Yes Milton Ferguson, MD  acetaminophen (TYLENOL) 325 MG tablet Take 325 mg by mouth every 6 (six) hours as needed for mild pain or headache.    [provider]  Ascorbic Acid (VITAMIN C) 500 MG CHEW Chew 500 mg by mouth 2 (two) times daily.    [provider]  aspirin EC 325 MG EC tablet Take 1 tablet (325 mg total) by mouth 2 (two) times daily after a meal. 11/27/16   Mcarthur Rossetti, MD  betamethasone valerate (VALISONE) 0.1 % cream  02/23/18   [provider]  Calcium 600-400 MG-UNIT CHEW Chew 1 tablet by mouth 3 (three) times daily.    [provider]  Calcium Polycarbophil (FIBERCON PO) Take 1 tablet by mouth at bedtime.    [provider]  cholecalciferol (VITAMIN D) 1000 units tablet Take 1,000 Units by mouth daily.    [provider]  Cyanocobalamin (VITAMIN B-12) 2500 MCG SUBL Place 2,500 mcg under the tongue daily.     [provider]  escitalopram (LEXAPRO) 20 MG tablet Take 20 mg by mouth daily.      [provider]  Influenza vac split quadrivalent PF (FLUZONE HIGH-DOSE) 0.5 ML injection Fluzone High-Dose 2019-20 (PF) 180 mcg/0.5 mL intramuscular syringe  PHARMACIST ADMINISTERED IMMUNIZATION ADMINISTERED AT TIME OF DISPENSING    [provider]  levothyroxine (SYNTHROID) 125 MCG tablet levothyroxine 125 mcg tablet    [provider]  loratadine (CLARITIN) 10 MG tablet Take 10 mg by mouth daily.    [provider]  Multiple Vitamin (MULTIVITAMIN WITH MINERALS) TABS tablet Take 1 tablet by mouth 2 (two) times daily.     [provider]  oxybutynin (DITROPAN) 5 MG tablet Take 5 mg by mouth every evening.  05/09/13   [provider]  oxybutynin  (DITROPAN-XL) 10 MG 24 hr tablet oxybutynin chloride ER 10 mg tablet,extended release 24 hr    [provider]  pneumococcal 13-valent conjugate vaccine (PREVNAR 13) SUSP injection Prevnar 13 (PF) 0.5 mL intramuscular syringe  PHARMACIST ADMINISTERED IMMUNIZATION ADMINISTERED AT TIME OF DISPENSING    [provider]  predniSONE (DELTASONE) 10 MG tablet prednisone 10 mg tablet    [provider]  sennosides-docusate sodium (SENOKOT-S) 8.6-50 MG tablet Take 2 tablets by mouth at bedtime.     [provider]    Allergies    No known allergies  Review of  Systems   Review of Systems  Constitutional:  Negative for appetite change and fatigue.  HENT:  Negative for congestion, ear discharge and sinus pressure.   Eyes:  Negative for discharge.  Respiratory:  Negative for cough.   Cardiovascular:  Negative for chest pain.  Gastrointestinal:  Negative for abdominal pain and diarrhea.  Genitourinary:  Positive for flank pain. Negative for frequency and hematuria.  Musculoskeletal:  Negative for back pain.  Skin:  Negative for rash.  Neurological:  Negative for seizures and headaches.  Psychiatric/Behavioral:  Negative for hallucinations.    Physical Exam Updated Vital Signs BP 137/70    Pulse 67    Temp 98.1 F (36.7 C) (Oral)    Resp 20    Ht 5\' 8"  (1.727 m)    Wt 89.8 kg    SpO2 (!) 89%    BMI 30.10 kg/m   Physical Exam Vitals and nursing note reviewed.  Constitutional:      Appearance: She is well-developed.  HENT:     Head: Normocephalic.     Nose: Nose normal.  Eyes:     General: No scleral icterus.    Conjunctiva/sclera: Conjunctivae normal.  Neck:     Thyroid: No thyromegaly.  Cardiovascular:     Rate and Rhythm: Normal rate and regular rhythm.     Heart sounds: No murmur heard.   No friction rub. No gallop.  Pulmonary:     Breath sounds: No stridor. No wheezing or rales.  Chest:     Chest wall: No tenderness.  Abdominal:     General:  There is no distension.     Tenderness: There is no abdominal tenderness. There is no rebound.  Genitourinary:    Comments: Tender right flank Musculoskeletal:        General: Normal range of motion.     Cervical back: Neck supple.  Lymphadenopathy:     Cervical: No cervical adenopathy.  Skin:    Findings: No erythema or rash.  Neurological:     Mental Status: She is alert and oriented to person, place, and time.     Motor: No abnormal muscle tone.     Coordination: Coordination normal.  Psychiatric:        Behavior: Behavior normal.    ED Results / Procedures / Treatments   Labs (all labs ordered are listed, but only abnormal results are displayed) Labs Reviewed  COMPREHENSIVE METABOLIC PANEL - Abnormal; Notable for the following components:      Result Value   CO2 20 (*)    Glucose, Bld 137 (*)    All other components within normal limits  URINALYSIS, ROUTINE W REFLEX MICROSCOPIC - Abnormal; Notable for the following components:   APPearance HAZY (*)    Hgb urine dipstick LARGE (*)    Protein, ur 30 (*)    All other components within normal limits  URINALYSIS, MICROSCOPIC (REFLEX) - Abnormal; Notable for the following components:   Bacteria, UA MANY (*)    All other components within normal limits  URINE CULTURE  CBC WITH DIFFERENTIAL/PLATELET    EKG None  Radiology CT Renal Stone Study  Result Date: 07/28/2021 CLINICAL DATA:  Flank pain.  Evaluate for nephrolithiasis. EXAM: CT ABDOMEN AND PELVIS WITHOUT CONTRAST TECHNIQUE: Multidetector CT imaging of the abdomen and pelvis was performed following the standard protocol without IV contrast. COMPARISON:  07/11/2021 FINDINGS: The lack of intravenous contrast limits the ability to evaluate solid abdominal organs. 1 Lower chest: Limited visualization of the lower thorax  demonstrates minimal left basilar subsegmental atelectasis, similar to the 07/11/2021 examination. No discrete focal airspace opacities. No pleural  effusion. Normal heart size. Suspected coronary artery calcifications. No pericardial effusion. Hepatobiliary: Normal hepatic contour. Post cholecystectomy. No ascites. Pancreas: Normal noncontrast appearance of the pancreas. Spleen: Normal noncontrast appearance of the spleen. Adrenals/Urinary Tract: Interval enlargement and displacement of the now approximately 1.7 x 1.2 x 0.9 cm right-sided renal stone (axial image 33, series 2; coronal image 53, series 5), previously, 1.4 x 1.1 x 0.8 cm, now located at the level of the right UPJ with associated slight worsening of mild right-sided pelvicaliectasis and associated asymmetric perinephric stranding (axial image 33, series 2; coronal image 53, series 5). Interval progression of nonobstructing right-sided nephro lithiasis stone burden with progression of the now approximately 0.8 cm slightly fragmented stone within the inferior pole the right kidney (coronal image 64, series 5), previously, 0.5 cm as well as development of a new approximate 0.7 cm nonobstructing stone within the inferior pole the right kidney (image 59, series 5), not seen on recent examination performed 07/11/2021. No evidence of left-sided nephrolithiasis. The left kidney remains slightly atrophic in comparison to the right. No evidence of left-sided urinary obstruction. Normal noncontrast appearance of the bilateral adrenal glands. Normal noncontrast appearance of the urinary bladder given degree of distention. Stomach/Bowel: Postgastric bypass with moderate-sized hiatal hernia. No evidence of enteric obstruction. Scattered colonic diverticulosis without evidence of superimposed acute diverticulitis. Normal appearance of the terminal ileum. The appendix is not visualized, however there is no pericecal inflammatory change. Vascular/Lymphatic: Scattered atherosclerotic plaque within a normal caliber abdominal aorta. No bulky retroperitoneal, mesenteric, pelvic or inguinal lymphadenopathy.  Reproductive: Normal noncontrast appearance of the pelvic organs for age. No free fluid the pelvic cul-de-sac. Other: Tiny mesenteric fat containing periumbilical hernia. There is a minimal amount of subcutaneous edema about the midline of the low back. Musculoskeletal: No acute or aggressive osseous abnormalities. Moderate to severe multilevel lumbar spine DDD, worse at T12-L1, L4-L5 and L5-S1 with disc space height loss, endplate irregularity and sclerosis. Mild-to-moderate degenerative change the bilateral hips with joint space loss, subchondral sclerosis and osteophytosis. IMPRESSION: 1. Interval increase in size and migration of the now approximally 1.7 cm right-sided renal stone, previously, 1.4 cm, now located at the level of the right UPJ with associated mild upstream ureterectasis, pelvicaliectasis and asymmetric right-sided perinephric stranding. 2. Interval progression of additional nonobstructing right-sided renal stones as detailed above. 3. No evidence of left-sided nephrolithiasis or urinary obstruction. 4. The left kidney remains slightly atrophic in comparison to the right, unchanged. 5. Sequela of previous gastric bypass surgery without evidence of enteric obstruction. 6.  Aortic Atherosclerosis (ICD10-I70.0). Electronically Signed   By: Sandi Mariscal M.D.   On: 07/28/2021 10:20    Procedures Procedures   Medications Ordered in ED Medications  ketorolac (TORADOL) 30 MG/ML injection 15 mg (15 mg Intravenous Given 07/28/21 0934)  ondansetron (ZOFRAN) injection 4 mg (4 mg Intravenous Given 07/28/21 0934)  HYDROmorphone (DILAUDID) injection 1 mg (1 mg Intravenous Given 07/28/21 0934)  cefTRIAXone (ROCEPHIN) 2 g in sodium chloride 0.9 % 100 mL IVPB (2 g Intravenous New Bag/Given 07/28/21 1115)    ED Course  I have reviewed the triage vital signs and the nursing notes.  Pertinent labs & imaging results that were available during my care of the patient were reviewed by me and considered in  my medical decision making (see chart for details). Patient with a kidney stone in the right urinary pelvis and possible  urinary tract infection.  I spoke with the urologist Dr. Milford Cage and he recommended antibiotics pain medicine and follow-up in the office   MDM Rules/Calculators/A&P                         Kidney infection with urinary tract infection patient given Keflex and Percocet and will follow-up with her urologist    Final Clinical Impression(s) / ED Diagnoses Final diagnoses:  Right flank pain    Rx / DC Orders ED Discharge Orders          Ordered    cephALEXin (KEFLEX) 500 MG capsule  4 times daily        07/28/21 1227    oxyCODONE-acetaminophen (PERCOCET) 5-325 MG tablet  Every 6 hours PRN        07/28/21 1227             Milton Ferguson, MD 07/28/21 1232

## 2021-07-28 NOTE — ED Triage Notes (Signed)
Pt to the ED with complaints of right flank pain that woke he up last night. Pt has been diagnosed with 2 right side kidney stones two weeks ago.

## 2021-07-29 LAB — URINE CULTURE

## 2021-07-30 DIAGNOSIS — R310 Gross hematuria: Secondary | ICD-10-CM | POA: Diagnosis not present

## 2021-07-30 DIAGNOSIS — N200 Calculus of kidney: Secondary | ICD-10-CM | POA: Diagnosis not present

## 2021-07-30 DIAGNOSIS — N23 Unspecified renal colic: Secondary | ICD-10-CM | POA: Diagnosis not present

## 2021-07-31 DIAGNOSIS — N202 Calculus of kidney with calculus of ureter: Secondary | ICD-10-CM | POA: Diagnosis not present

## 2021-07-31 DIAGNOSIS — N200 Calculus of kidney: Secondary | ICD-10-CM | POA: Diagnosis not present

## 2021-08-02 DIAGNOSIS — I10 Essential (primary) hypertension: Secondary | ICD-10-CM | POA: Diagnosis not present

## 2021-08-02 DIAGNOSIS — E782 Mixed hyperlipidemia: Secondary | ICD-10-CM | POA: Diagnosis not present

## 2021-08-07 DIAGNOSIS — N200 Calculus of kidney: Secondary | ICD-10-CM | POA: Diagnosis not present

## 2021-09-09 DIAGNOSIS — N200 Calculus of kidney: Secondary | ICD-10-CM | POA: Diagnosis not present

## 2021-09-18 DIAGNOSIS — N3946 Mixed incontinence: Secondary | ICD-10-CM | POA: Diagnosis not present

## 2021-09-25 DIAGNOSIS — N3946 Mixed incontinence: Secondary | ICD-10-CM | POA: Diagnosis not present

## 2021-11-06 DIAGNOSIS — N3946 Mixed incontinence: Secondary | ICD-10-CM | POA: Diagnosis not present

## 2021-11-07 DIAGNOSIS — E782 Mixed hyperlipidemia: Secondary | ICD-10-CM | POA: Diagnosis not present

## 2021-11-07 DIAGNOSIS — E039 Hypothyroidism, unspecified: Secondary | ICD-10-CM | POA: Diagnosis not present

## 2021-11-12 DIAGNOSIS — M542 Cervicalgia: Secondary | ICD-10-CM | POA: Diagnosis not present

## 2021-11-12 DIAGNOSIS — Z6833 Body mass index (BMI) 33.0-33.9, adult: Secondary | ICD-10-CM | POA: Diagnosis not present

## 2021-11-12 DIAGNOSIS — R42 Dizziness and giddiness: Secondary | ICD-10-CM | POA: Diagnosis not present

## 2021-11-12 DIAGNOSIS — R748 Abnormal levels of other serum enzymes: Secondary | ICD-10-CM | POA: Diagnosis not present

## 2021-11-12 DIAGNOSIS — F429 Obsessive-compulsive disorder, unspecified: Secondary | ICD-10-CM | POA: Diagnosis not present

## 2021-11-12 DIAGNOSIS — R0600 Dyspnea, unspecified: Secondary | ICD-10-CM | POA: Diagnosis not present

## 2021-11-12 DIAGNOSIS — Z6834 Body mass index (BMI) 34.0-34.9, adult: Secondary | ICD-10-CM | POA: Diagnosis not present

## 2021-11-12 DIAGNOSIS — R011 Cardiac murmur, unspecified: Secondary | ICD-10-CM | POA: Diagnosis not present

## 2021-11-12 DIAGNOSIS — F331 Major depressive disorder, recurrent, moderate: Secondary | ICD-10-CM | POA: Diagnosis not present

## 2021-11-12 DIAGNOSIS — E039 Hypothyroidism, unspecified: Secondary | ICD-10-CM | POA: Diagnosis not present

## 2021-11-12 DIAGNOSIS — N3281 Overactive bladder: Secondary | ICD-10-CM | POA: Diagnosis not present

## 2021-12-03 DIAGNOSIS — Z20822 Contact with and (suspected) exposure to covid-19: Secondary | ICD-10-CM | POA: Diagnosis not present

## 2021-12-05 DIAGNOSIS — Z20822 Contact with and (suspected) exposure to covid-19: Secondary | ICD-10-CM | POA: Diagnosis not present

## 2021-12-12 DIAGNOSIS — R350 Frequency of micturition: Secondary | ICD-10-CM | POA: Diagnosis not present

## 2021-12-12 DIAGNOSIS — N3946 Mixed incontinence: Secondary | ICD-10-CM | POA: Diagnosis not present

## 2022-01-30 ENCOUNTER — Other Ambulatory Visit (HOSPITAL_COMMUNITY): Payer: Self-pay | Admitting: Family Medicine

## 2022-01-30 ENCOUNTER — Ambulatory Visit (HOSPITAL_COMMUNITY)
Admission: RE | Admit: 2022-01-30 | Discharge: 2022-01-30 | Disposition: A | Discharge status: Home / Self Care | Payer: Medicare Other | Source: Ambulatory Visit | Attending: Family Medicine | Admitting: Family Medicine

## 2022-01-30 DIAGNOSIS — M542 Cervicalgia: Secondary | ICD-10-CM

## 2022-01-30 DIAGNOSIS — M79672 Pain in left foot: Secondary | ICD-10-CM | POA: Diagnosis not present

## 2022-01-30 DIAGNOSIS — R42 Dizziness and giddiness: Secondary | ICD-10-CM | POA: Diagnosis not present

## 2022-02-13 ENCOUNTER — Ambulatory Visit (INDEPENDENT_AMBULATORY_CARE_PROVIDER_SITE_OTHER): Payer: Medicare Other | Admitting: Podiatry

## 2022-02-13 ENCOUNTER — Ambulatory Visit (INDEPENDENT_AMBULATORY_CARE_PROVIDER_SITE_OTHER): Payer: Medicare Other

## 2022-02-13 DIAGNOSIS — M779 Enthesopathy, unspecified: Secondary | ICD-10-CM

## 2022-02-13 DIAGNOSIS — M19072 Primary osteoarthritis, left ankle and foot: Secondary | ICD-10-CM | POA: Diagnosis not present

## 2022-02-13 DIAGNOSIS — M79672 Pain in left foot: Secondary | ICD-10-CM

## 2022-02-13 DIAGNOSIS — M722 Plantar fascial fibromatosis: Secondary | ICD-10-CM

## 2022-02-13 NOTE — Patient Instructions (Signed)
Achilles Tendinitis  with Rehab- YOU DON'T HAVE THIS BUT I LIKE THE EXERCISES THAT GO ALONG WITH THIS Achilles tendinitis is a disorder of the Achilles tendon. The Achilles tendon connects the large calf muscles (Gastrocnemius and Soleus) to the heel bone (calcaneus). This tendon is sometimes called the heel cord. It is important for pushing-off and standing on your toes and is important for walking, running, or jumping. Tendinitis is often caused by overuse and repetitive microtrauma. SYMPTOMS Pain, tenderness, swelling, warmth, and redness may occur over the Achilles tendon even at rest. Pain with pushing off, or flexing or extending the ankle. Pain that is worsened after or during activity. CAUSES  Overuse sometimes seen with rapid increase in exercise programs or in sports requiring running and jumping. Poor physical conditioning (strength and flexibility or endurance). Running sports, especially training running down hills. Inadequate warm-up before practice or play or failure to stretch before participation. Injury to the tendon. PREVENTION  Warm up and stretch before practice or competition. Allow time for adequate rest and recovery between practices and competition. Keep up conditioning. Keep up ankle and leg flexibility. Improve or keep muscle strength and endurance. Improve cardiovascular fitness. Use proper technique. Use proper equipment (shoes, skates). To help prevent recurrence, taping, protective strapping, or an adhesive bandage may be recommended for several weeks after healing is complete. PROGNOSIS  Recovery may take weeks to several months to heal. Longer recovery is expected if symptoms have been prolonged. Recovery is usually quicker if the inflammation is due to a direct blow as compared with overuse or sudden strain. RELATED COMPLICATIONS  Healing time will be prolonged if the condition is not correctly treated. The injury must be given plenty of time to  heal. Symptoms can reoccur if activity is resumed too soon. Untreated, tendinitis may increase the risk of tendon rupture requiring additional time for recovery and possibly surgery. TREATMENT  The first treatment consists of rest anti-inflammatory medication, and ice to relieve the pain. Stretching and strengthening exercises after resolution of pain will likely help reduce the risk of recurrence. Referral to a physical therapist or athletic trainer for further evaluation and treatment may be helpful. A walking boot or cast may be recommended to rest the Achilles tendon. This can help break the cycle of inflammation and microtrauma. Arch supports (orthotics) may be prescribed or recommended by your caregiver as an adjunct to therapy and rest. Surgery to remove the inflamed tendon lining or degenerated tendon tissue is rarely necessary and has shown less than predictable results. MEDICATION  Nonsteroidal anti-inflammatory medications, such as aspirin and ibuprofen, may be used for pain and inflammation relief. Do not take within 7 days before surgery. Take these as directed by your caregiver. Contact your caregiver immediately if any bleeding, stomach upset, or signs of allergic reaction occur. Other minor pain relievers, such as acetaminophen, may also be used. Pain relievers may be prescribed as necessary by your caregiver. Do not take prescription pain medication for longer than 4 to 7 days. Use only as directed and only as much as you need. Cortisone injections are rarely indicated. Cortisone injections may weaken tendons and predispose to rupture. It is better to give the condition more time to heal than to use them. HEAT AND COLD Cold is used to relieve pain and reduce inflammation for acute and chronic Achilles tendinitis. Cold should be applied for 10 to 15 minutes every 2 to 3 hours for inflammation and pain and immediately after any activity that aggravates your symptoms.  Use ice packs or an  ice massage. Heat may be used before performing stretching and strengthening activities prescribed by your caregiver. Use a heat pack or a warm soak. SEEK MEDICAL CARE IF: Symptoms get worse or do not improve in 2 weeks despite treatment. New, unexplained symptoms develop. Drugs used in treatment may produce side effects.  EXERCISES:  RANGE OF MOTION (ROM) AND STRETCHING EXERCISES - Achilles Tendinitis  These exercises may help you when beginning to rehabilitate your injury. Your symptoms may resolve with or without further involvement from your physician, physical therapist or athletic trainer. While completing these exercises, remember:  Restoring tissue flexibility helps normal motion to return to the joints. This allows healthier, less painful movement and activity. An effective stretch should be held for at least 30 seconds. A stretch should never be painful. You should only feel a gentle lengthening or release in the stretched tissue.  STRETCH  Gastroc, Standing  Place hands on wall. Extend right / left leg, keeping the front knee somewhat bent. Slightly point your toes inward on your back foot. Keeping your right / left heel on the floor and your knee straight, shift your weight toward the wall, not allowing your back to arch. You should feel a gentle stretch in the right / left calf. Hold this position for 10 seconds. Repeat 3 times. Complete this stretch 2 times per day.  STRETCH  Soleus, Standing  Place hands on wall. Extend right / left leg, keeping the other knee somewhat bent. Slightly point your toes inward on your back foot. Keep your right / left heel on the floor, bend your back knee, and slightly shift your weight over the back leg so that you feel a gentle stretch deep in your back calf. Hold this position for 10 seconds. Repeat 3 times. Complete this stretch 2 times per day.  STRETCH  Gastrocsoleus, Standing  Note: This exercise can place a lot of stress on your  foot and ankle. Please complete this exercise only if specifically instructed by your caregiver.  Place the ball of your right / left foot on a step, keeping your other foot firmly on the same step. Hold on to the wall or a rail for balance. Slowly lift your other foot, allowing your body weight to press your heel down over the edge of the step. You should feel a stretch in your right / left calf. Hold this position for 10 seconds. Repeat this exercise with a slight bend in your knee. Repeat 3 times. Complete this stretch 2 times per day.   STRENGTHENING EXERCISES - Achilles Tendinitis These exercises may help you when beginning to rehabilitate your injury. They may resolve your symptoms with or without further involvement from your physician, physical therapist or athletic trainer. While completing these exercises, remember:  Muscles can gain both the endurance and the strength needed for everyday activities through controlled exercises. Complete these exercises as instructed by your physician, physical therapist or athletic trainer. Progress the resistance and repetitions only as guided. You may experience muscle soreness or fatigue, but the pain or discomfort you are trying to eliminate should never worsen during these exercises. If this pain does worsen, stop and make certain you are following the directions exactly. If the pain is still present after adjustments, discontinue the exercise until you can discuss the trouble with your clinician.  STRENGTH - Plantar-flexors  Sit with your right / left leg extended. Holding onto both ends of a rubber exercise band/tubing, loop it  around the ball of your foot. Keep a slight tension in the band. Slowly push your toes away from you, pointing them downward. Hold this position for 10 seconds. Return slowly, controlling the tension in the band/tubing. Repeat 3 times. Complete this exercise 2 times per day.   STRENGTH - Plantar-flexors  Stand with your  feet shoulder width apart. Steady yourself with a wall or table using as little support as needed. Keeping your weight evenly spread over the width of your feet, rise up on your toes.* Hold this position for 10 seconds. Repeat 3 times. Complete this exercise 2 times per day.  *If this is too easy, shift your weight toward your right / left leg until you feel challenged. Ultimately, you may be asked to do this exercise with your right / left foot only.  STRENGTH  Plantar-flexors, Eccentric  Note: This exercise can place a lot of stress on your foot and ankle. Please complete this exercise only if specifically instructed by your caregiver.  Place the balls of your feet on a step. With your hands, use only enough support from a wall or rail to keep your balance. Keep your knees straight and rise up on your toes. Slowly shift your weight entirely to your right / left toes and pick up your opposite foot. Gently and with controlled movement, lower your weight through your right / left foot so that your heel drops below the level of the step. You will feel a slight stretch in the back of your calf at the end position. Use the healthy leg to help rise up onto the balls of both feet, then lower weight only on the right / left leg again. Build up to 15 repetitions. Then progress to 3 consecutive sets of 15 repetitions.* After completing the above exercise, complete the same exercise with a slight knee bend (about 30 degrees). Again, build up to 15 repetitions. Then progress to 3 consecutive sets of 15 repetitions.* Perform this exercise 2 times per day.  *When you easily complete 3 sets of 15, your physician, physical therapist or athletic trainer may advise you to add resistance by wearing a backpack filled with additional weight.  STRENGTH - Plantar Flexors, Seated  Sit on a chair that allows your feet to rest flat on the ground. If necessary, sit at the edge of the chair. Keeping your toes firmly on the  ground, lift your right / left heel as far as you can without increasing any discomfort in your ankle. Repeat 3 times. Complete this exercise 2 times a day.

## 2022-02-20 NOTE — Progress Notes (Signed)
Subjective:   [name], female   DOB: 81 y.o.   MRN: [id number]   HPI 81 year old female presents the office today for concerns of left foot pain.  She said this started about 2 years ago after she had knee surgery and she is experiencing discomfort in the arch as well as the medial aspect of the foot.  She is try to [employer] sock and she also went to the [employer] store she got shoes, inserts which made the pain worse.  No injuries that she reports.  She states that she likes to exercise and walk and this does limit her activity.  She does get occasional swelling.   Review of Systems  All other systems reviewed and are negative.  Past Medical History:  Diagnosis Date   Allergy    takes Claritin daily   Arthritis    Complication of anesthesia    Constipation    takes Senokot daily   Depression    takes Lexapro daily   Family history of adverse reaction to anesthesia    sister has a hard time waking up   GERD (gastroesophageal reflux disease)    History of blood clots 1979   after C/S clot went to lung   History of bronchitis    3 yrs ago   History of migraine    Hypothyroidism    takes Synthroid daily   Joint pain    Joint swelling    Personal history of colonic polyps    benign   PONV (postoperative nausea and vomiting)    Rheumatic fever 1958   hospitalized for three months   Urinary leakage    takes Ditropan daily   Vitamin D deficiency    takes Vit D daily    Past Surgical History:  Procedure Laterality Date   APPENDECTOMY     incidental at time of C-section   [place]   cataract surgery Bilateral    CESAREAN SECTION  1979   CHOLECYSTECTOMY  1982   COLONOSCOPY  2004   Dr. Imogene Burn polyps, left sided diverticula. Path unavailable.    COLONOSCOPY  02/12/2011   Procedure: COLONOSCOPY;  Surgeon: Daneil Dolin, MD;  Location: [facility];  Service: Endoscopy;  Laterality: N/A;   ESOPHAGOGASTRODUODENOSCOPY  02/12/2011    Procedure: ESOPHAGOGASTRODUODENOSCOPY (EGD);  Surgeon: Daneil Dolin, MD;  Location: [facility];  Service: Endoscopy;  Laterality: N/A;   GASTRIC ROUX-EN-Y N/A 06/19/2015   Procedure: LAPAROSCOPIC ROUX-EN-Y GASTRIC BYPASS WITH UPPER ENDOSCOPY;  Surgeon: Excell Seltzer, MD;  Location: [facility];  Service: General;  Laterality: N/A;   KNEE SURGERY  1999   left   TOTAL KNEE ARTHROPLASTY  10/2009   left   TOTAL KNEE ARTHROPLASTY Right 11/25/2016   Procedure: RIGHT TOTAL KNEE ARTHROPLASTY;  Surgeon: Mcarthur Rossetti, MD;  Location: [place];  Service: Orthopedics;  Laterality: Right;   UPPER GI ENDOSCOPY  06/19/2015   Procedure: UPPER GI ENDOSCOPY;  Surgeon: Excell Seltzer, MD;  Location: [facility];  Service: General;;     Current Outpatient Medications:    acetaminophen (TYLENOL) 325 MG tablet, Take 325 mg by mouth every 6 (six) hours as needed for mild pain or headache., Disp: , Rfl:    Ascorbic Acid (VITAMIN C) 500 MG CHEW, Chew 500 mg by mouth 2 (two) times daily., Disp: , Rfl:    aspirin EC 325 MG EC tablet, Take 1 tablet (325 mg total) by mouth 2 (two) times daily  after a meal., Disp: 30 tablet, Rfl: 0   betamethasone valerate (VALISONE) 0.1 % cream, , Disp: , Rfl:    Calcium 600-400 MG-UNIT CHEW, Chew 1 tablet by mouth 3 (three) times daily., Disp: , Rfl:    Calcium Polycarbophil (FIBERCON PO), Take 1 tablet by mouth at bedtime., Disp: , Rfl:    cephALEXin (KEFLEX) 500 MG capsule, Take 1 capsule (500 mg total) by mouth 3 (three) times daily., Disp: 21 capsule, Rfl: 0   cholecalciferol (VITAMIN D) 1000 units tablet, Take 1,000 Units by mouth daily., Disp: , Rfl:    Cyanocobalamin (VITAMIN B-12) 2500 MCG SUBL, Place 2,500 mcg under the tongue daily. , Disp: , Rfl:    escitalopram (LEXAPRO) 20 MG tablet, Take 20 mg by mouth daily.  , Disp: , Rfl:    Influenza vac split quadrivalent PF (FLUZONE HIGH-DOSE) 0.5 ML injection, Fluzone High-Dose 2019-20 (PF) 180 mcg/0.5 mL intramuscular  syringe  PHARMACIST ADMINISTERED IMMUNIZATION ADMINISTERED AT TIME OF DISPENSING, Disp: , Rfl:    levothyroxine (SYNTHROID) 125 MCG tablet, levothyroxine 125 mcg tablet, Disp: , Rfl:    loratadine (CLARITIN) 10 MG tablet, Take 10 mg by mouth daily., Disp: , Rfl:    Multiple Vitamin (MULTIVITAMIN WITH MINERALS) TABS tablet, Take 1 tablet by mouth 2 (two) times daily. , Disp: , Rfl:    oxybutynin (DITROPAN) 5 MG tablet, Take 5 mg by mouth every evening. , Disp: , Rfl:    oxybutynin (DITROPAN-XL) 10 MG 24 hr tablet, oxybutynin chloride ER 10 mg tablet,extended release 24 hr, Disp: , Rfl:    oxyCODONE-acetaminophen (PERCOCET) 5-325 MG tablet, Take 1 tablet by mouth every 6 (six) hours as needed., Disp: 20 tablet, Rfl: 0   pneumococcal 13-valent conjugate vaccine (PREVNAR 13) SUSP injection, Prevnar 13 (PF) 0.5 mL intramuscular syringe  PHARMACIST ADMINISTERED IMMUNIZATION ADMINISTERED AT TIME OF DISPENSING, Disp: , Rfl:    predniSONE (DELTASONE) 10 MG tablet, prednisone 10 mg tablet, Disp: , Rfl:    sennosides-docusate sodium (SENOKOT-S) 8.6-50 MG tablet, Take 2 tablets by mouth at bedtime. , Disp: , Rfl:   Allergies  Allergen Reactions   No Known Allergies        Objective:  Physical Exam  General: AAO x3, NAD  Dermatological: Skin is warm, dry and supple bilateral. There are no open sores, no preulcerative lesions, no rash or signs of infection present.  Vascular: Dorsalis Pedis artery and Posterior Tibial artery pedal pulses are 2/4 bilateral with immedate capillary fill time. There is no pain with calf compression, swelling, warmth, erythema.   Neruologic: Grossly intact via light touch bilateral.  Sensation intact with Semmes Weinstein monofilament.  Musculoskeletal: Joint tenderness is localized in the medial aspect of foot along the navicular cuneiform joint area.  There is no significant pain along the tibialis anterior, flexor, extensor tendons.  She does get tenderness on the arch of  the foot on the medial aspect but no specific area pinpoint tenderness otherwise.  Muscular strength 5/5 in all groups tested bilateral.  Gait: Unassisted, Nonantalgic.       Assessment:   81 year old female with left foot arthritis, Planter fasciitis     Plan:  -Treatment options discussed including all alternatives, risks, and complications -Etiology of symptoms were discussed -X-rays were obtained and reviewed with the patient.  3 views left foot were obtained.  No evidence of acute fracture. -I do think that her symptoms are likely due to some arthritis present along the joint which is noted on x-ray.  Inserts  made it worse and likely given too much support.  Discussed wearing stiffer soled shoes.  Discussed anti-inflammatory such as Voltaren gel topically.  Icing.  Consider steroid injection if needed as well.  Trula Slade DPM

## 2022-02-28 ENCOUNTER — Other Ambulatory Visit: Payer: Self-pay | Admitting: Podiatry

## 2022-02-28 DIAGNOSIS — M47812 Spondylosis without myelopathy or radiculopathy, cervical region: Secondary | ICD-10-CM | POA: Diagnosis not present

## 2022-02-28 DIAGNOSIS — M4802 Spinal stenosis, cervical region: Secondary | ICD-10-CM | POA: Diagnosis not present

## 2022-02-28 DIAGNOSIS — M19072 Primary osteoarthritis, left ankle and foot: Secondary | ICD-10-CM

## 2022-02-28 DIAGNOSIS — Z6833 Body mass index (BMI) 33.0-33.9, adult: Secondary | ICD-10-CM | POA: Diagnosis not present

## 2022-03-05 ENCOUNTER — Other Ambulatory Visit: Payer: Self-pay | Admitting: Neurosurgery

## 2022-03-05 DIAGNOSIS — M47812 Spondylosis without myelopathy or radiculopathy, cervical region: Secondary | ICD-10-CM

## 2022-03-07 ENCOUNTER — Ambulatory Visit
Admission: RE | Admit: 2022-03-07 | Discharge: 2022-03-07 | Disposition: A | Discharge status: Home / Self Care | Payer: Medicare Other | Source: Ambulatory Visit | Attending: Neurosurgery | Admitting: Neurosurgery

## 2022-03-07 DIAGNOSIS — M542 Cervicalgia: Secondary | ICD-10-CM | POA: Diagnosis not present

## 2022-03-07 DIAGNOSIS — M47812 Spondylosis without myelopathy or radiculopathy, cervical region: Secondary | ICD-10-CM

## 2022-03-11 DIAGNOSIS — N3946 Mixed incontinence: Secondary | ICD-10-CM | POA: Diagnosis not present

## 2022-03-17 DIAGNOSIS — Z6833 Body mass index (BMI) 33.0-33.9, adult: Secondary | ICD-10-CM | POA: Diagnosis not present

## 2022-03-17 DIAGNOSIS — M47812 Spondylosis without myelopathy or radiculopathy, cervical region: Secondary | ICD-10-CM | POA: Diagnosis not present

## 2022-03-27 DIAGNOSIS — N390 Urinary tract infection, site not specified: Secondary | ICD-10-CM | POA: Diagnosis not present

## 2022-03-27 DIAGNOSIS — Z87442 Personal history of urinary calculi: Secondary | ICD-10-CM | POA: Diagnosis not present

## 2022-03-27 DIAGNOSIS — N3281 Overactive bladder: Secondary | ICD-10-CM | POA: Diagnosis not present

## 2022-04-10 ENCOUNTER — Other Ambulatory Visit: Payer: Self-pay | Admitting: Family Medicine

## 2022-04-10 ENCOUNTER — Other Ambulatory Visit (HOSPITAL_COMMUNITY): Payer: Self-pay | Admitting: Family Medicine

## 2022-04-10 DIAGNOSIS — R5383 Other fatigue: Secondary | ICD-10-CM | POA: Diagnosis not present

## 2022-04-10 DIAGNOSIS — T17308A Unspecified foreign body in larynx causing other injury, initial encounter: Secondary | ICD-10-CM

## 2022-04-10 DIAGNOSIS — Z23 Encounter for immunization: Secondary | ICD-10-CM | POA: Diagnosis not present

## 2022-04-10 DIAGNOSIS — E559 Vitamin D deficiency, unspecified: Secondary | ICD-10-CM | POA: Diagnosis not present

## 2022-04-10 DIAGNOSIS — R0989 Other specified symptoms and signs involving the circulatory and respiratory systems: Secondary | ICD-10-CM | POA: Diagnosis not present

## 2022-04-11 ENCOUNTER — Other Ambulatory Visit: Payer: Self-pay | Admitting: Family Medicine

## 2022-04-11 ENCOUNTER — Other Ambulatory Visit (HOSPITAL_COMMUNITY): Payer: Self-pay | Admitting: Family Medicine

## 2022-04-11 DIAGNOSIS — R0602 Shortness of breath: Secondary | ICD-10-CM

## 2022-04-21 ENCOUNTER — Other Ambulatory Visit (HOSPITAL_COMMUNITY): Payer: Self-pay | Admitting: Family Medicine

## 2022-04-21 ENCOUNTER — Other Ambulatory Visit: Payer: Self-pay | Admitting: Family Medicine

## 2022-04-21 DIAGNOSIS — R1084 Generalized abdominal pain: Secondary | ICD-10-CM

## 2022-04-27 ENCOUNTER — Emergency Department (HOSPITAL_COMMUNITY): Payer: Medicare Other

## 2022-04-27 ENCOUNTER — Encounter (HOSPITAL_COMMUNITY): Payer: Self-pay | Admitting: Emergency Medicine

## 2022-04-27 ENCOUNTER — Emergency Department (HOSPITAL_COMMUNITY)
Admission: EM | Admit: 2022-04-27 | Discharge: 2022-04-27 | Disposition: A | Discharge status: Home / Self Care | Payer: Medicare Other | Attending: Emergency Medicine | Admitting: Emergency Medicine

## 2022-04-27 ENCOUNTER — Other Ambulatory Visit: Payer: Self-pay

## 2022-04-27 DIAGNOSIS — N200 Calculus of kidney: Secondary | ICD-10-CM | POA: Insufficient documentation

## 2022-04-27 DIAGNOSIS — M7989 Other specified soft tissue disorders: Secondary | ICD-10-CM

## 2022-04-27 DIAGNOSIS — K449 Diaphragmatic hernia without obstruction or gangrene: Secondary | ICD-10-CM | POA: Insufficient documentation

## 2022-04-27 DIAGNOSIS — Z7982 Long term (current) use of aspirin: Secondary | ICD-10-CM | POA: Diagnosis not present

## 2022-04-27 DIAGNOSIS — W1839XA Other fall on same level, initial encounter: Secondary | ICD-10-CM | POA: Insufficient documentation

## 2022-04-27 DIAGNOSIS — S8992XA Unspecified injury of left lower leg, initial encounter: Secondary | ICD-10-CM | POA: Diagnosis present

## 2022-04-27 DIAGNOSIS — M79642 Pain in left hand: Secondary | ICD-10-CM | POA: Diagnosis not present

## 2022-04-27 DIAGNOSIS — S298XXA Other specified injuries of thorax, initial encounter: Secondary | ICD-10-CM

## 2022-04-27 DIAGNOSIS — J984 Other disorders of lung: Secondary | ICD-10-CM | POA: Diagnosis not present

## 2022-04-27 DIAGNOSIS — Y9301 Activity, walking, marching and hiking: Secondary | ICD-10-CM | POA: Insufficient documentation

## 2022-04-27 DIAGNOSIS — S8392XA Sprain of unspecified site of left knee, initial encounter: Secondary | ICD-10-CM | POA: Insufficient documentation

## 2022-04-27 DIAGNOSIS — S299XXA Unspecified injury of thorax, initial encounter: Secondary | ICD-10-CM | POA: Insufficient documentation

## 2022-04-27 DIAGNOSIS — E039 Hypothyroidism, unspecified: Secondary | ICD-10-CM | POA: Insufficient documentation

## 2022-04-27 DIAGNOSIS — S3991XA Unspecified injury of abdomen, initial encounter: Secondary | ICD-10-CM | POA: Diagnosis not present

## 2022-04-27 DIAGNOSIS — K5730 Diverticulosis of large intestine without perforation or abscess without bleeding: Secondary | ICD-10-CM | POA: Insufficient documentation

## 2022-04-27 DIAGNOSIS — M25562 Pain in left knee: Secondary | ICD-10-CM | POA: Diagnosis not present

## 2022-04-27 DIAGNOSIS — W19XXXA Unspecified fall, initial encounter: Secondary | ICD-10-CM

## 2022-04-27 DIAGNOSIS — Z9049 Acquired absence of other specified parts of digestive tract: Secondary | ICD-10-CM | POA: Diagnosis not present

## 2022-04-27 LAB — BASIC METABOLIC PANEL
Anion gap: 7 (ref 5–15)
BUN: 11 mg/dL (ref 8–23)
CO2: 26 mmol/L (ref 22–32)
Calcium: 8.8 mg/dL — ABNORMAL LOW (ref 8.9–10.3)
Chloride: 110 mmol/L (ref 98–111)
Creatinine, Ser: 0.69 mg/dL (ref 0.44–1.00)
GFR, Estimated: >60 mL/min (ref >60)
Glucose, Bld: 100 mg/dL — ABNORMAL HIGH (ref 70–99)
Potassium: 3.7 mmol/L (ref 3.5–5.1)
Sodium: 143 mmol/L (ref 135–145)

## 2022-04-27 LAB — CBC
HCT: 34.2 % — ABNORMAL LOW (ref 36.0–46.0)
Hemoglobin: 11.2 g/dL — ABNORMAL LOW (ref 12.0–15.0)
MCH: 31.6 pg (ref 26.0–34.0)
MCHC: 32.7 g/dL (ref 30.0–36.0)
MCV: 96.6 fL (ref 80.0–100.0)
Platelets: 150 10*3/uL (ref 150–400)
RBC: 3.54 MIL/uL — ABNORMAL LOW (ref 3.87–5.11)
RDW: 14.3 % (ref 11.5–15.5)
WBC: 4.5 10*3/uL (ref 4.0–10.5)
nRBC: 0 % (ref 0.0–0.2)

## 2022-04-27 MED ORDER — SODIUM CHLORIDE 0.9 % IV BOLUS
500.0000 mL | Freq: Once | INTRAVENOUS | Status: AC
  Administered 2022-04-27: 500 mL via INTRAVENOUS

## 2022-04-27 MED ORDER — METHOCARBAMOL 500 MG PO TABS: 500.0000 mg | ORAL_TABLET | Freq: Three times a day (TID) | ORAL | 0 refills | Status: DC | PRN

## 2022-04-27 MED ORDER — IOHEXOL 300 MG/ML  SOLN
100.0000 mL | Freq: Once | INTRAMUSCULAR | Status: AC | PRN
  Administered 2022-04-27: 100 mL via INTRAVENOUS

## 2022-04-27 NOTE — ED Provider Notes (Signed)
[facility] EMERGENCY DEPARTMENT Provider Note   CSN: [id number] Arrival date & time: 04/27/22  0725     History  Chief Complaint  Patient presents with   [name] is a 81 y.o. female.   Fall  Patient presents after fall.  Was in [place] and was in a to her group and reportedly had to walk a mile.  [address] because they were going too fast.  Complaining of pain in left knee left hand chest and abdomen.  States she is not hurting too much at the time but has worsened.  Hit head but no loss conscious.  Not on blood thinners.    Past Medical History:  Diagnosis Date   Allergy    takes Claritin daily   Arthritis    Complication of anesthesia    Constipation    takes Senokot daily   Depression    takes Lexapro daily   Family history of adverse reaction to anesthesia    sister has a hard time waking up   GERD (gastroesophageal reflux disease)    History of blood clots 1979   after C/S clot went to lung   History of bronchitis    3 yrs ago   History of migraine    Hypothyroidism    takes Synthroid daily   Joint pain    Joint swelling    Personal history of colonic polyps    benign   PONV (postoperative nausea and vomiting)    Rheumatic fever 1958   hospitalized for three months   Urinary leakage    takes Ditropan daily   Vitamin D deficiency    takes Vit D daily    Home Medications Prior to Admission medications   Medication Sig Start Date End Date Taking? Authorizing Provider  methocarbamol (ROBAXIN) 500 MG tablet Take 1 tablet (500 mg total) by mouth every 8 (eight) hours as needed for muscle spasms. 04/27/22  Yes Davonna Belling, MD  acetaminophen (TYLENOL) 325 MG tablet Take 325 mg by mouth every 6 (six) hours as needed for mild pain or headache.    [provider]  Ascorbic Acid (VITAMIN C) 500 MG CHEW Chew 500 mg by mouth 2 (two) times daily.    [provider]  aspirin EC 325 MG EC tablet Take 1 tablet (325 mg total) by  mouth 2 (two) times daily after a meal. 11/27/16   Mcarthur Rossetti, MD  betamethasone valerate (VALISONE) 0.1 % cream  02/23/18   [provider]  Calcium 600-400 MG-UNIT CHEW Chew 1 tablet by mouth 3 (three) times daily.    [provider]  Calcium Polycarbophil (FIBERCON PO) Take 1 tablet by mouth at bedtime.    [provider]  cephALEXin (KEFLEX) 500 MG capsule Take 1 capsule (500 mg total) by mouth 3 (three) times daily. 07/28/21   Drenda Freeze, MD  cholecalciferol (VITAMIN D) 1000 units tablet Take 1,000 Units by mouth daily.    [provider]  Cyanocobalamin (VITAMIN B-12) 2500 MCG SUBL Place 2,500 mcg under the tongue daily.     [provider]  escitalopram (LEXAPRO) 20 MG tablet Take 20 mg by mouth daily.      [provider]  Influenza vac split quadrivalent PF (FLUZONE HIGH-DOSE) 0.5 ML injection Fluzone High-Dose 2019-20 (PF) 180 mcg/0.5 mL intramuscular syringe  PHARMACIST ADMINISTERED IMMUNIZATION ADMINISTERED AT TIME OF DISPENSING    [provider]  levothyroxine (SYNTHROID) 125 MCG tablet levothyroxine 125  mcg tablet    [provider]  loratadine (CLARITIN) 10 MG tablet Take 10 mg by mouth daily.    [provider]  Multiple Vitamin (MULTIVITAMIN WITH MINERALS) TABS tablet Take 1 tablet by mouth 2 (two) times daily.     [provider]  oxybutynin (DITROPAN) 5 MG tablet Take 5 mg by mouth every evening.  05/09/13   [provider]  oxybutynin (DITROPAN-XL) 10 MG 24 hr tablet oxybutynin chloride ER 10 mg tablet,extended release 24 hr    [provider]  oxyCODONE-acetaminophen (PERCOCET) 5-325 MG tablet Take 1 tablet by mouth every 6 (six) hours as needed. 07/28/21   Drenda Freeze, MD  pneumococcal 13-valent conjugate vaccine (PREVNAR 13) SUSP injection Prevnar 13 (PF) 0.5 mL intramuscular syringe  PHARMACIST ADMINISTERED IMMUNIZATION ADMINISTERED AT TIME OF  DISPENSING    [provider]  predniSONE (DELTASONE) 10 MG tablet prednisone 10 mg tablet    [provider]  sennosides-docusate sodium (SENOKOT-S) 8.6-50 MG tablet Take 2 tablets by mouth at bedtime.     [provider]      Allergies    No known allergies    Review of Systems   Review of Systems  Physical Exam Updated Vital Signs BP (!) 166/86   Pulse 63   Temp 98.2 F (36.8 C) (Oral)   Resp 18   Ht '5\' 8"'$  (1.727 m)   Wt 90.7 kg   SpO2 97%   BMI 30.40 kg/m  Physical Exam Vitals and nursing note reviewed.  HENT:     Head: Atraumatic.  Eyes:     Extraocular Movements: Extraocular movements intact.  Pulmonary:     Breath sounds: No wheezing or rhonchi.     Comments: tenderness to left lateral chest wall.  No ecchymosis. Chest:     Chest wall: Tenderness present.  Abdominal:     Tenderness: There is abdominal tenderness.     Comments: Moderate left upper quadrant tenderness.  No rebound or guarding.  No ecchymosis.  Musculoskeletal:        General: Tenderness present.     Cervical back: Neck supple. No tenderness.     Comments: Tenderness somewhat diffusely over left knee.  Scar from previous placement.  Ecchymosis over much of the knee.  Somewhat decreased range of motion.  No crepitance.  Knee appears stable.  Only mild hip tenderness.  Mild tenderness over left hand near thumb.  Superficial laceration without large deformity.  No wrist tenderness.  Skin:    Capillary Refill: Capillary refill takes less than 2 seconds.  Neurological:     Mental Status: She is alert.     ED Results / Procedures / Treatments   Labs (all labs ordered are listed, but only abnormal results are displayed) Labs Reviewed  BASIC METABOLIC PANEL - Abnormal; Notable for the following components:      Result Value   Glucose, Bld 100 (*)    Calcium 8.8 (*)    All other components within normal limits  CBC - Abnormal; Notable for the following components:   RBC  3.54 (*)    Hemoglobin 11.2 (*)    HCT 34.2 (*)    All other components within normal limits    EKG None  Radiology CT CHEST ABDOMEN PELVIS W CONTRAST  Result Date: 04/27/2022 CLINICAL DATA:  Fall onto pavement. Blunt poly trauma. Chest and abdominal pain. EXAM: CT CHEST, ABDOMEN, AND PELVIS WITH CONTRAST TECHNIQUE: Multidetector CT imaging of the chest, abdomen and  pelvis was performed following the standard protocol during bolus administration of intravenous contrast. RADIATION DOSE REDUCTION: This exam was performed according to the departmental dose-optimization program which includes automated exposure control, adjustment of the mA and/or kV according to patient size and/or use of iterative reconstruction technique. CONTRAST:  110m OMNIPAQUE IOHEXOL 300 MG/ML  SOLN COMPARISON:  Noncontrast abdomen CT on 07/28/2021 FINDINGS: CT CHEST FINDINGS Cardiovascular: No evidence of thoracic aortic injury or mediastinal hematoma. No pericardial effusion. Mediastinum/Nodes: No evidence of hemorrhage or pneumomediastinum. No masses or pathologically enlarged lymph nodes identified. Lungs/Pleura: No evidence of pulmonary contusion or other infiltrate. Mild scarring noted in posterior right lower lobe. No evidence of pneumothorax or hemothorax. Musculoskeletal: No acute fractures or suspicious bone lesions identified. CT ABDOMEN PELVIS FINDINGS Hepatobiliary: No hepatic laceration or mass identified. Prior cholecystectomy. No evidence of biliary obstruction. Pancreas: No parenchymal laceration, mass, or inflammatory changes identified. Spleen: No evidence of splenic laceration. Adrenal/Urinary Tract: No hemorrhage or parenchymal lacerations identified. 5 mm nonobstructing calculus noted in lower pole of right kidney. No evidence of suspicious masses or hydronephrosis. Stomach/Bowel: Prior gastric bypass surgery is again noted with stable small hiatal hernia. Unopacified bowel loops are unremarkable in appearance.  Diverticulosis is seen mainly involving the sigmoid colon, however there is no evidence of diverticulitis. Vascular/Lymphatic: No evidence of abdominal aortic injury or retroperitoneal hemorrhage. No pathologically enlarged lymph nodes identified. Reproductive: Uterus and adnexal regions are unremarkable in appearance. Tiny amount of low-attenuation free fluid is seen in the pelvic cul-de-sac, which is nonspecific. Other:  None. Musculoskeletal: No acute fractures or suspicious bone lesions identified. IMPRESSION: No evidence of internal organ injury. Tiny amount of low-attenuation free fluid in pelvic cul-de-sac, which is nonspecific. Colonic diverticulosis, without radiographic evidence of diverticulitis. Nonobstructing right renal calculus. Stable small hiatal hernia. Electronically Signed   By: JMarlaine HindM.D.   On: 04/27/2022 10:18   DG Knee Complete 4 Views Left  Result Date: 04/27/2022 CLINICAL DATA:  Pain after fall EXAM: LEFT KNEE - COMPLETE 4+ VIEW COMPARISON:  None Available. FINDINGS: No fracture. Left knee replacement. Hardware is in good position. Anterior soft tissue swelling. No joint effusion. IMPRESSION: Anterior soft tissue swelling. No fracture or joint effusion. Left knee replacement hardware is in good position. Electronically Signed   By: DDorise BullionIII M.D.   On: 04/27/2022 08:41   DG Hand Complete Left  Result Date: 04/27/2022 CLINICAL DATA:  81year old female with history of left hand pain after a fall. EXAM: LEFT HAND - COMPLETE 3+ VIEW COMPARISON:  No priors. FINDINGS: There is no evidence of fracture or dislocation. There is no evidence of arthropathy or other focal bone abnormality. Soft tissues are unremarkable. IMPRESSION: Negative. Electronically Signed   By: DVinnie LangtonM.D.   On: 04/27/2022 08:40    Procedures Procedures    Medications Ordered in ED Medications  sodium chloride 0.9 % bolus 500 mL (0 mLs Intravenous Stopped 04/27/22 1029)  iohexol  (OMNIPAQUE) 300 MG/ML solution 100 mL (100 mLs Intravenous Contrast Given 04/27/22 0945)    ED Course/ Medical Decision Making/ A&P                           Medical Decision Making Amount and/or Complexity of Data Reviewed Labs: ordered. Radiology: ordered.  Risk Prescription drug management.   Patient with fall.  Hit left knee  With ecchymosis and will get x-ray.  However also has moderate abdominal tenderness.  Differential diagnosis does  include just more superficial pain but also includes pathology such as rib fractures and splenic injury.  Will get CT imaging.  We will also get x-ray of left hand.  C-spine clinically cleared.  Hit head but no loss conscious not on blood thinners.  Has been also stable from cranial standpoint over the last 2 days  CT scan done chest abdomen pelvis and reassuring.  Independently interpreted and showed no fractures or definite intra-abdominal injury.  No splenic injury.  Knee x-ray reassuring.  We will however apply knee immobilizer with the pain.  Does have a fair amount of bruising but with the swelling will get outpatient Doppler scheduled for tomorrow.  Will not anticoagulate at this time due to amount of bruising however.  Appears stable for discharge home and will have follow-up with her orthopedic surgeon and will have symptomatic treatment with muscle relaxer        Final Clinical Impression(s) / ED Diagnoses Final diagnoses:  Fall, initial encounter  Sprain of left knee, unspecified ligament, initial encounter  Blunt chest trauma, initial encounter    Rx / DC Orders ED Discharge Orders          Ordered    US Venous Img Lower Unilateral Left        04/27/22 1100    methocarbamol (ROBAXIN) 500 MG tablet  Every 8 hours PRN        04/27/22 1100              Davonna Belling, MD 04/27/22 1108

## 2022-04-27 NOTE — ED Notes (Signed)
Knee immobilizer in place, pt states that she is ready to go home, pt verbalized understanding d/c instructions and follow up. Pt from dpt with friend.

## 2022-04-27 NOTE — ED Triage Notes (Signed)
Pt to the ED with complaints of a fall that occurred on Friday.  Pt states she has injured her left knee.  Pt states she did hit her head on the pavement on the left side.  Pt denies hurting her head and is not on blood thinners.

## 2022-04-27 NOTE — Discharge Instructions (Addendum)
Follow-up for the ultrasound.  Follow-up with orthopedic surgery for the knee pain.  Use the knee immobilizer as needed for pain.

## 2022-04-28 ENCOUNTER — Telehealth (HOSPITAL_COMMUNITY): Payer: Self-pay | Admitting: Student

## 2022-04-28 ENCOUNTER — Ambulatory Visit (INDEPENDENT_AMBULATORY_CARE_PROVIDER_SITE_OTHER): Payer: Medicare Other | Admitting: Orthopaedic Surgery

## 2022-04-28 ENCOUNTER — Ambulatory Visit (HOSPITAL_COMMUNITY)
Admission: RE | Admit: 2022-04-28 | Discharge: 2022-04-28 | Disposition: A | Discharge status: Home / Self Care | Payer: Medicare Other | Source: Ambulatory Visit | Attending: Emergency Medicine | Admitting: Emergency Medicine

## 2022-04-28 DIAGNOSIS — M7989 Other specified soft tissue disorders: Secondary | ICD-10-CM | POA: Insufficient documentation

## 2022-04-28 DIAGNOSIS — S8002XA Contusion of left knee, initial encounter: Secondary | ICD-10-CM | POA: Diagnosis not present

## 2022-04-28 DIAGNOSIS — R600 Localized edema: Secondary | ICD-10-CM | POA: Diagnosis not present

## 2022-04-28 NOTE — Progress Notes (Signed)
The patient is worked in today after being seen in the emergency room yesterday.  She actually fell on Friday at [facility].  She injured her knee that was were placed remotely.  X-rays were negative for any fracture.  She had she went back there today for a ultrasound rule out DVT and that was negative.  She was told to call our office so they worked her in today.  She has x-rays of her knee for me to review as well as her left hand that was bruised.  She is 81 years old.  The x-rays were all negative in the ER in terms of fractures.  I have actually looked at the x-rays of her hand and her left knee and there is no evidence of fracture either.  There are some soft tissue swelling but no knee joint effusion.  Examination of her left knee today does show bruising and swelling and an abrasion.  Her extensor mechanism is intact and the knee is ligamentously stable.  Her left hand shows bruising but no issues on my exam in terms of malalignment.  Again the x-rays all are reviewed and show no evidence of any type of fracture.  I gave reassurance that the only thing that she really could benefit from now is just rest and elevation as well as ice.  I would like her to place mupirocin ointment over her abrasion at her knee daily and we will see her back in 2 weeks for a wound check.  If there is anything worsening before then she will let us know.  I would like her to be careful when she ambulates and try to use her cane or walker.

## 2022-04-28 NOTE — Telephone Encounter (Signed)
I reviewed the DVT ultrasound performed this morning and called the patient to inform her that her results were reassuringly negative.

## 2022-05-07 DIAGNOSIS — E782 Mixed hyperlipidemia: Secondary | ICD-10-CM | POA: Diagnosis not present

## 2022-05-07 DIAGNOSIS — E039 Hypothyroidism, unspecified: Secondary | ICD-10-CM | POA: Diagnosis not present

## 2022-05-14 DIAGNOSIS — R42 Dizziness and giddiness: Secondary | ICD-10-CM | POA: Diagnosis not present

## 2022-05-14 DIAGNOSIS — E039 Hypothyroidism, unspecified: Secondary | ICD-10-CM | POA: Diagnosis not present

## 2022-05-14 DIAGNOSIS — F331 Major depressive disorder, recurrent, moderate: Secondary | ICD-10-CM | POA: Diagnosis not present

## 2022-05-14 DIAGNOSIS — Z9181 History of falling: Secondary | ICD-10-CM | POA: Diagnosis not present

## 2022-05-14 DIAGNOSIS — R748 Abnormal levels of other serum enzymes: Secondary | ICD-10-CM | POA: Diagnosis not present

## 2022-05-14 DIAGNOSIS — F429 Obsessive-compulsive disorder, unspecified: Secondary | ICD-10-CM | POA: Diagnosis not present

## 2022-05-14 DIAGNOSIS — R0600 Dyspnea, unspecified: Secondary | ICD-10-CM | POA: Diagnosis not present

## 2022-05-14 DIAGNOSIS — E782 Mixed hyperlipidemia: Secondary | ICD-10-CM | POA: Diagnosis not present

## 2022-05-14 DIAGNOSIS — Z6833 Body mass index (BMI) 33.0-33.9, adult: Secondary | ICD-10-CM | POA: Diagnosis not present

## 2022-05-14 DIAGNOSIS — R5383 Other fatigue: Secondary | ICD-10-CM | POA: Diagnosis not present

## 2022-05-15 DIAGNOSIS — Z1231 Encounter for screening mammogram for malignant neoplasm of breast: Secondary | ICD-10-CM | POA: Diagnosis not present

## 2022-05-15 DIAGNOSIS — L900 Lichen sclerosus et atrophicus: Secondary | ICD-10-CM | POA: Diagnosis not present

## 2022-05-15 DIAGNOSIS — N952 Postmenopausal atrophic vaginitis: Secondary | ICD-10-CM | POA: Diagnosis not present

## 2022-05-15 DIAGNOSIS — Z6834 Body mass index (BMI) 34.0-34.9, adult: Secondary | ICD-10-CM | POA: Diagnosis not present

## 2022-05-15 DIAGNOSIS — Z01419 Encounter for gynecological examination (general) (routine) without abnormal findings: Secondary | ICD-10-CM | POA: Diagnosis not present

## 2022-05-19 ENCOUNTER — Ambulatory Visit: Payer: Medicare Other | Admitting: Orthopaedic Surgery

## 2022-05-21 ENCOUNTER — Ambulatory Visit (INDEPENDENT_AMBULATORY_CARE_PROVIDER_SITE_OTHER): Payer: Medicare Other | Admitting: Orthopaedic Surgery

## 2022-05-21 ENCOUNTER — Encounter: Payer: Self-pay | Admitting: Orthopaedic Surgery

## 2022-05-21 DIAGNOSIS — S8002XA Contusion of left knee, initial encounter: Secondary | ICD-10-CM | POA: Diagnosis not present

## 2022-05-21 NOTE — Progress Notes (Signed)
The patient is an 81 year old that I saw 2 weeks ago after she sustained a contusion to her left knee after a mechanical fall when she was touring in [place].  She said the knee is feeling much better overall.  I wanted to see her today for a wound check.  The abrasion is healed completely over her left knee.  She does have a history of a knee replacement.  There is a prepatellar fluid collection and offered her an aspiration of this but it is not hurting her and she said she would rather not have it aspirated.  This will likely slowly dissipate with time.  However if it starts to bother her in any way she knows to let us know.  Since she is doing so well follow-up can be as needed.  She knows if there are any issues to not hesitate to let us know.

## 2022-05-27 ENCOUNTER — Ambulatory Visit (HOSPITAL_COMMUNITY)
Admission: RE | Admit: 2022-05-27 | Discharge: 2022-05-27 | Disposition: A | Discharge status: Home / Self Care | Payer: Medicare Other | Source: Ambulatory Visit | Attending: Family Medicine | Admitting: Family Medicine

## 2022-05-27 ENCOUNTER — Encounter (HOSPITAL_COMMUNITY): Payer: Self-pay

## 2022-05-27 ENCOUNTER — Ambulatory Visit (HOSPITAL_COMMUNITY): Payer: Medicare Other

## 2022-05-27 DIAGNOSIS — R1084 Generalized abdominal pain: Secondary | ICD-10-CM

## 2022-06-11 DIAGNOSIS — N3942 Incontinence without sensory awareness: Secondary | ICD-10-CM | POA: Diagnosis not present

## 2022-06-11 DIAGNOSIS — N3946 Mixed incontinence: Secondary | ICD-10-CM | POA: Diagnosis not present

## 2022-06-14 DIAGNOSIS — Z23 Encounter for immunization: Secondary | ICD-10-CM | POA: Diagnosis not present

## 2022-06-23 DIAGNOSIS — E039 Hypothyroidism, unspecified: Secondary | ICD-10-CM | POA: Diagnosis not present

## 2022-07-06 ENCOUNTER — Ambulatory Visit
Admission: EM | Admit: 2022-07-06 | Discharge: 2022-07-06 | Disposition: A | Discharge status: Home / Self Care | Payer: Medicare Other | Attending: Family Medicine | Admitting: Family Medicine

## 2022-07-06 DIAGNOSIS — Z1152 Encounter for screening for COVID-19: Secondary | ICD-10-CM | POA: Insufficient documentation

## 2022-07-06 DIAGNOSIS — J069 Acute upper respiratory infection, unspecified: Secondary | ICD-10-CM | POA: Insufficient documentation

## 2022-07-06 DIAGNOSIS — R062 Wheezing: Secondary | ICD-10-CM | POA: Diagnosis not present

## 2022-07-06 LAB — RESP PANEL BY RT-PCR (FLU A&B, COVID) ARPGX2
Influenza A by PCR: NEGATIVE
Influenza B by PCR: NEGATIVE
SARS Coronavirus 2 by RT PCR: NEGATIVE

## 2022-07-06 MED ORDER — FLUTICASONE PROPIONATE 50 MCG/ACT NA SUSP: 1.0000 | Freq: Two times a day (BID) | NASAL | 2 refills | Status: DC

## 2022-07-06 MED ORDER — ALBUTEROL SULFATE HFA 108 (90 BASE) MCG/ACT IN AERS: 2.0000 | INHALATION_SPRAY | RESPIRATORY_TRACT | 0 refills | Status: DC | PRN

## 2022-07-06 MED ORDER — PROMETHAZINE-DM 6.25-15 MG/5ML PO SYRP: 5.0000 mL | ORAL_SOLUTION | Freq: Four times a day (QID) | ORAL | 0 refills | Status: DC | PRN

## 2022-07-06 NOTE — ED Provider Notes (Signed)
[facility]    CSN: [id number] Arrival date & time: 07/06/22  1019      History   Chief Complaint No chief complaint on file.   HPI [name] is a 81 y.o. female.   Patient presenting today with 4-day history of progressively worsening productive cough, headache, wheezing, nasal congestion, sore throat, left ear pain.  Denies chest pain, shortness of breath, abdominal pain, nausea vomiting or diarrhea.  So far taking Tylenol and NyQuil with minimal relief.  No known history of chronic pulmonary disease.  No known sick contacts recently.    Past Medical History:  Diagnosis Date   Allergy    takes Claritin daily   Arthritis    Complication of anesthesia    Constipation    takes Senokot daily   Depression    takes Lexapro daily   Family history of adverse reaction to anesthesia    sister has a hard time waking up   GERD (gastroesophageal reflux disease)    History of blood clots 1979   after C/S clot went to lung   History of bronchitis    3 yrs ago   History of migraine    Hypothyroidism    takes Synthroid daily   Joint pain    Joint swelling    Personal history of colonic polyps    benign   PONV (postoperative nausea and vomiting)    Rheumatic fever 1958   hospitalized for three months   Urinary leakage    takes Ditropan daily   Vitamin D deficiency    takes Vit D daily    Patient Active Problem List   Diagnosis Date Noted   Thyroid dysfunction 02/25/2019   Overactive bladder 38/05/1750   Lichen sclerosus of female genitalia 02/25/2019   Status post total right knee replacement 11/25/2016   Unilateral primary osteoarthritis, right knee 10/22/2016   Trigger thumb, left thumb 10/22/2016   Morbid obesity ([place]) 06/19/2015   GERD (gastroesophageal reflux disease) 01/20/2011   Colon polyp 01/20/2011   Hemorrhoids 01/20/2011    Past Surgical History:  Procedure Laterality Date   APPENDECTOMY     incidental at time of C-section   [place]   cataract surgery Bilateral    CESAREAN SECTION  1979   CHOLECYSTECTOMY  1982   COLONOSCOPY  2004   Dr. Imogene Burn polyps, left sided diverticula. Path unavailable.    COLONOSCOPY  02/12/2011   Procedure: COLONOSCOPY;  Surgeon: Daneil Dolin, MD;  [facility];  Service: Endoscopy;  Laterality: N/A;   ESOPHAGOGASTRODUODENOSCOPY  02/12/2011   Procedure: ESOPHAGOGASTRODUODENOSCOPY (EGD);  Surgeon: Daneil Dolin, MD;  [facility];  Service: Endoscopy;  Laterality: N/A;   GASTRIC ROUX-EN-Y N/A 06/19/2015   Procedure: LAPAROSCOPIC ROUX-EN-Y GASTRIC BYPASS WITH UPPER ENDOSCOPY;  Surgeon: Excell Seltzer, MD;  Location: [facility];  Service: General;  Laterality: N/A;   KNEE SURGERY  1999   left   TOTAL KNEE ARTHROPLASTY  10/2009   left   TOTAL KNEE ARTHROPLASTY Right 11/25/2016   Procedure: RIGHT TOTAL KNEE ARTHROPLASTY;  Surgeon: Mcarthur Rossetti, MD;  Location: [facility];  Service: Orthopedics;  Laterality: Right;   UPPER GI ENDOSCOPY  06/19/2015   Procedure: UPPER GI ENDOSCOPY;  Surgeon: Excell Seltzer, MD;  Location: [facility];  Service: General;;    OB History   No obstetric history on file.      Home Medications    Prior to Admission medications   Medication  Sig Start Date End Date Taking? Authorizing Provider  albuterol (VENTOLIN HFA) 108 (90 Base) MCG/ACT inhaler Inhale 2 puffs into the lungs every 4 (four) hours as needed for wheezing or shortness of breath. 07/06/22  Yes Volney American, PA-C  fluticasone [facility]) 50 MCG/ACT nasal spray Place 1 spray into both nostrils 2 (two) times daily. 07/06/22  Yes Volney American, PA-C  promethazine-dextromethorphan (PROMETHAZINE-DM) 6.25-15 MG/5ML syrup Take 5 mLs by mouth 4 (four) times daily as needed. 07/06/22  Yes Volney American, PA-C  acetaminophen (TYLENOL) 325 MG tablet Take 325 mg by mouth every 6 (six) hours as needed for mild pain or headache.    [provider]  Ascorbic Acid (VITAMIN C) 500 MG CHEW Chew 500 mg by mouth 2 (two) times daily.    [provider]  aspirin EC 325 MG EC tablet Take 1 tablet (325 mg total) by mouth 2 (two) times daily after a meal. 11/27/16   Mcarthur Rossetti, MD  betamethasone valerate (VALISONE) 0.1 % cream  02/23/18   [provider]  Calcium 600-400 MG-UNIT CHEW Chew 1 tablet by mouth 3 (three) times daily.    [provider]  Calcium Polycarbophil (FIBERCON PO) Take 1 tablet by mouth at bedtime.    [provider]  cephALEXin (KEFLEX) 500 MG capsule Take 1 capsule (500 mg total) by mouth 3 (three) times daily. 07/28/21   Drenda Freeze, MD  cholecalciferol (VITAMIN D) 1000 units tablet Take 1,000 Units by mouth daily.    [provider]  Cyanocobalamin (VITAMIN B-12) 2500 MCG SUBL Place 2,500 mcg under the tongue daily.     [provider]  escitalopram (LEXAPRO) 20 MG tablet Take 20 mg by mouth daily.      [provider]  Influenza vac split quadrivalent PF (FLUZONE HIGH-DOSE) 0.5 ML injection Fluzone High-Dose 2019-20 (PF) 180 mcg/0.5 mL intramuscular syringe  PHARMACIST ADMINISTERED IMMUNIZATION ADMINISTERED AT TIME OF DISPENSING    [provider]  levothyroxine (SYNTHROID) 125 MCG tablet levothyroxine 125 mcg tablet    [provider]  loratadine (CLARITIN) 10 MG tablet Take 10 mg by mouth daily.    [provider]  methocarbamol (ROBAXIN) 500 MG tablet Take 1 tablet (500 mg total) by mouth every 8 (eight) hours as needed for muscle spasms. 04/27/22   Davonna Belling, MD  Multiple Vitamin (MULTIVITAMIN WITH MINERALS) TABS tablet Take 1 tablet by mouth 2 (two) times daily.     [provider]  oxybutynin (DITROPAN) 5 MG tablet Take 5 mg by mouth every evening.  05/09/13   [provider]  oxybutynin (DITROPAN-XL) 10 MG 24 hr tablet oxybutynin chloride ER 10 mg tablet,extended release 24 hr    [provider]  oxyCODONE-acetaminophen (PERCOCET) 5-325 MG tablet Take 1 tablet by mouth every 6 (six) hours as needed. 07/28/21   Drenda Freeze, MD  pneumococcal 13-valent conjugate vaccine (PREVNAR 13) SUSP injection Prevnar 13 (PF) 0.5 mL intramuscular syringe  PHARMACIST ADMINISTERED IMMUNIZATION ADMINISTERED AT TIME OF DISPENSING    [provider]  predniSONE (DELTASONE) 10 MG tablet prednisone 10 mg tablet    [provider]  sennosides-docusate sodium (SENOKOT-S) 8.6-50 MG tablet Take 2 tablets by mouth at bedtime.     [provider]    Family History Family History  Problem Relation Age of Onset   Diabetes Other        siblings, mother   Lung disease Father  black lung, died young   Heart disease Brother    Colon cancer Neg Hx     Social History Social History   Tobacco Use   Smoking status: Never   Smokeless tobacco: Never  Vaping Use   Vaping Use: Never used  Substance Use Topics   Alcohol use: No   Drug use: No     Allergies   No known allergies   Review of Systems Review of Systems Per HPI  Physical Exam Triage Vital Signs ED Triage Vitals  Enc Vitals Group     BP 07/06/22 1201 (!) 161/89     Pulse Rate 07/06/22 1201 78     Resp 07/06/22 1201 20     Temp 07/06/22 1201 99.1 F (37.3 C)     Temp Source 07/06/22 1158 Oral     SpO2 07/06/22 1201 95 %     Weight --      Height --      Head Circumference --      Peak Flow --      Pain Score 07/06/22 1202 6     Pain Loc --      Pain Edu? --      Excl. in [place]? --    No data found.  Updated Vital Signs BP (!) 161/89 (BP Location: Right Arm)   Pulse 78   Temp 99.1 F (37.3 C) (Oral)   Resp 20   SpO2 95%   Visual Acuity Right Eye Distance:   Left Eye Distance:   Bilateral Distance:    Right Eye Near:   Left Eye Near:    Bilateral Near:     Physical Exam Vitals and nursing note reviewed.  Constitutional:      Appearance: Normal appearance.   HENT:     Head: Atraumatic.     Right Ear: Tympanic membrane and external ear normal.     Left Ear: Tympanic membrane and external ear normal.     Nose: Rhinorrhea present.     Mouth/Throat:     Mouth: Mucous membranes are moist.     Pharynx: Posterior oropharyngeal erythema present.  Eyes:     Extraocular Movements: Extraocular movements intact.     Conjunctiva/sclera: Conjunctivae normal.  Cardiovascular:     Rate and Rhythm: Normal rate and regular rhythm.     Heart sounds: Normal heart sounds.  Pulmonary:     Effort: Pulmonary effort is normal.     Breath sounds: Wheezing present. No rales.  Musculoskeletal:        General: Normal range of motion.     Cervical back: Normal range of motion and neck supple.  Skin:    General: Skin is warm and dry.  Neurological:     Mental Status: She is alert and oriented to person, place, and time.  Psychiatric:        Mood and Affect: Mood normal.        Thought Content: Thought content normal.      UC Treatments / Results  Labs (all labs ordered are listed, but only abnormal results are displayed) Labs Reviewed  RESP PANEL BY RT-PCR (FLU A&B, COVID) [id number]    EKG   Radiology No results found.  Procedures Procedures (including critical care time)  Medications Ordered in UC Medications - No data to display  Initial Impression / Assessment and Plan / UC Course  I have reviewed the triage vital signs and the nursing notes.  Pertinent labs & imaging results that were  available during my care of the patient were reviewed by me and considered in my medical decision making (see chart for details).     Hypertensive in triage, otherwise vital signs reassuring.  Suspect viral upper respiratory infection causing some wheezing.  Respiratory panel pending, treat with albuterol inhaler as needed, Flonase, Phenergan DM, supportive over-the-counter remedies.  Return for worsening symptoms.  Good candidate for antiviral therapy if  positive for COVID or flu.  Final Clinical Impressions(s) / UC Diagnoses   Final diagnoses:  Viral URI with cough  Wheezing   Discharge Instructions   None    ED Prescriptions     Medication Sig Dispense Auth. Provider   albuterol (VENTOLIN HFA) 108 (90 Base) MCG/ACT inhaler Inhale 2 puffs into the lungs every 4 (four) hours as needed for wheezing or shortness of breath. [place], [place]   promethazine-dextromethorphan (PROMETHAZINE-DM) 6.25-15 MG/5ML syrup Take 5 mLs by mouth 4 (four) times daily as needed. 100 mL Volney American, PA-C   fluticasone [facility]) 50 MCG/ACT nasal spray Place 1 spray into both nostrils 2 (two) times daily. 16 g Volney American, [place]      PDMP not reviewed this encounter.   Volney American, [place] 07/06/22 1312

## 2022-07-06 NOTE — ED Triage Notes (Signed)
Pt she is coughing, has a headache, spitting up flem that started Thursday with a sore throat. She has some ear pain.  Took tylenol and nyquil but no relief.

## 2022-07-09 ENCOUNTER — Ambulatory Visit (HOSPITAL_COMMUNITY)
Admission: RE | Admit: 2022-07-09 | Discharge: 2022-07-09 | Disposition: A | Discharge status: Home / Self Care | Payer: Medicare Other | Source: Ambulatory Visit | Attending: Family Medicine | Admitting: Family Medicine

## 2022-07-09 ENCOUNTER — Other Ambulatory Visit (HOSPITAL_COMMUNITY): Payer: Self-pay | Admitting: Family Medicine

## 2022-07-09 ENCOUNTER — Encounter (HOSPITAL_COMMUNITY): Payer: Self-pay | Admitting: Family Medicine

## 2022-07-09 DIAGNOSIS — J069 Acute upper respiratory infection, unspecified: Secondary | ICD-10-CM | POA: Diagnosis not present

## 2022-07-09 DIAGNOSIS — R059 Cough, unspecified: Secondary | ICD-10-CM | POA: Diagnosis not present

## 2022-07-09 DIAGNOSIS — R0602 Shortness of breath: Secondary | ICD-10-CM | POA: Diagnosis not present

## 2022-07-29 DIAGNOSIS — N390 Urinary tract infection, site not specified: Secondary | ICD-10-CM | POA: Diagnosis not present

## 2022-08-06 DIAGNOSIS — E039 Hypothyroidism, unspecified: Secondary | ICD-10-CM | POA: Diagnosis not present

## 2022-09-11 DIAGNOSIS — N3946 Mixed incontinence: Secondary | ICD-10-CM | POA: Diagnosis not present

## 2022-09-26 DIAGNOSIS — R42 Dizziness and giddiness: Secondary | ICD-10-CM | POA: Diagnosis not present

## 2022-09-26 DIAGNOSIS — H903 Sensorineural hearing loss, bilateral: Secondary | ICD-10-CM | POA: Diagnosis not present

## 2022-09-26 DIAGNOSIS — H838X3 Other specified diseases of inner ear, bilateral: Secondary | ICD-10-CM | POA: Diagnosis not present

## 2022-10-14 DIAGNOSIS — R42 Dizziness and giddiness: Secondary | ICD-10-CM | POA: Diagnosis not present

## 2022-10-21 DIAGNOSIS — R42 Dizziness and giddiness: Secondary | ICD-10-CM | POA: Diagnosis not present

## 2022-10-21 DIAGNOSIS — H832X1 Labyrinthine dysfunction, right ear: Secondary | ICD-10-CM | POA: Diagnosis not present

## 2022-11-13 DIAGNOSIS — E039 Hypothyroidism, unspecified: Secondary | ICD-10-CM | POA: Diagnosis not present

## 2022-11-13 DIAGNOSIS — Z139 Encounter for screening, unspecified: Secondary | ICD-10-CM | POA: Diagnosis not present

## 2022-11-13 DIAGNOSIS — E782 Mixed hyperlipidemia: Secondary | ICD-10-CM | POA: Diagnosis not present

## 2022-11-13 DIAGNOSIS — R7301 Impaired fasting glucose: Secondary | ICD-10-CM | POA: Diagnosis not present

## 2022-11-19 ENCOUNTER — Ambulatory Visit (HOSPITAL_COMMUNITY): Payer: Medicare Other

## 2022-11-19 DIAGNOSIS — Z0000 Encounter for general adult medical examination without abnormal findings: Secondary | ICD-10-CM | POA: Diagnosis not present

## 2022-11-20 DIAGNOSIS — R2689 Other abnormalities of gait and mobility: Secondary | ICD-10-CM | POA: Diagnosis not present

## 2022-11-20 DIAGNOSIS — R748 Abnormal levels of other serum enzymes: Secondary | ICD-10-CM | POA: Diagnosis not present

## 2022-11-20 DIAGNOSIS — R030 Elevated blood-pressure reading, without diagnosis of hypertension: Secondary | ICD-10-CM | POA: Diagnosis not present

## 2022-11-20 DIAGNOSIS — R7301 Impaired fasting glucose: Secondary | ICD-10-CM | POA: Diagnosis not present

## 2022-11-20 DIAGNOSIS — E669 Obesity, unspecified: Secondary | ICD-10-CM | POA: Diagnosis not present

## 2022-11-20 DIAGNOSIS — R42 Dizziness and giddiness: Secondary | ICD-10-CM | POA: Diagnosis not present

## 2022-11-20 DIAGNOSIS — E039 Hypothyroidism, unspecified: Secondary | ICD-10-CM | POA: Diagnosis not present

## 2022-11-20 DIAGNOSIS — E782 Mixed hyperlipidemia: Secondary | ICD-10-CM | POA: Diagnosis not present

## 2022-11-20 DIAGNOSIS — Z0001 Encounter for general adult medical examination with abnormal findings: Secondary | ICD-10-CM | POA: Diagnosis not present

## 2022-11-20 DIAGNOSIS — R809 Proteinuria, unspecified: Secondary | ICD-10-CM | POA: Diagnosis not present

## 2022-11-20 DIAGNOSIS — F429 Obsessive-compulsive disorder, unspecified: Secondary | ICD-10-CM | POA: Diagnosis not present

## 2022-11-20 DIAGNOSIS — F331 Major depressive disorder, recurrent, moderate: Secondary | ICD-10-CM | POA: Diagnosis not present

## 2022-11-21 ENCOUNTER — Ambulatory Visit (HOSPITAL_COMMUNITY): Payer: Medicare Other | Attending: Otolaryngology

## 2022-11-21 ENCOUNTER — Other Ambulatory Visit: Payer: Self-pay

## 2022-11-21 DIAGNOSIS — R42 Dizziness and giddiness: Secondary | ICD-10-CM | POA: Insufficient documentation

## 2022-11-21 DIAGNOSIS — R2689 Other abnormalities of gait and mobility: Secondary | ICD-10-CM | POA: Diagnosis not present

## 2022-11-21 NOTE — Therapy (Addendum)
OUTPATIENT PHYSICAL THERAPY VESTIBULAR EVALUATION     Patient Name: [name] MRN: [id number] DOB:1940-08-18, 82 y.o., female Today's Date: 11/21/2022  END OF SESSION:  PT End of Session - 11/21/22 1112     Visit Number 1    Number of Visits 6    Date for PT Re-Evaluation 12/26/22    Authorization Type Medicare part A and [employer]    Progress Note Due on Visit 10    PT Start Time 1110    PT Stop Time 1150    PT Time Calculation (min) 40 min    Activity Tolerance Patient tolerated treatment well    Behavior During Therapy WFL for tasks assessed/performed             Past Medical History:  Diagnosis Date   Allergy    takes Claritin daily   Arthritis    Complication of anesthesia    Constipation    takes Senokot daily   Depression    takes Lexapro daily   Family history of adverse reaction to anesthesia    sister has a hard time waking up   GERD (gastroesophageal reflux disease)    History of blood clots 1979   after C/S clot went to lung   History of bronchitis    3 yrs ago   History of migraine    Hypothyroidism    takes Synthroid daily   Joint pain    Joint swelling    Personal history of colonic polyps    benign   PONV (postoperative nausea and vomiting)    Rheumatic fever 1958   hospitalized for three months   Urinary leakage    takes Ditropan daily   Vitamin D deficiency    takes Vit D daily   Past Surgical History:  Procedure Laterality Date   APPENDECTOMY     incidental at time of C-section   BACK SURGERY  1986   cataract surgery Bilateral    CESAREAN SECTION  1979   CHOLECYSTECTOMY  1982   COLONOSCOPY  2004   Dr. Frankey Poot polyps, left sided diverticula. Path unavailable.    COLONOSCOPY  02/12/2011   Procedure: COLONOSCOPY;  Surgeon: Corbin Ade, MD;  [facility];  Service: Endoscopy;  Laterality: N/A;   ESOPHAGOGASTRODUODENOSCOPY  02/12/2011   Procedure: ESOPHAGOGASTRODUODENOSCOPY (EGD);  Surgeon: Corbin Ade, MD;   [facility];  Service: Endoscopy;  Laterality: N/A;   GASTRIC ROUX-EN-Y N/A 06/19/2015   Procedure: LAPAROSCOPIC ROUX-EN-Y GASTRIC BYPASS WITH UPPER ENDOSCOPY;  Surgeon: Glenna Fellows, MD;  [facility];  Service: General;  Laterality: N/A;   KNEE SURGERY  1999   left   TOTAL KNEE ARTHROPLASTY  10/2009   left   TOTAL KNEE ARTHROPLASTY Right 11/25/2016   Procedure: RIGHT TOTAL KNEE ARTHROPLASTY;  Surgeon: Kathryne Hitch, MD;  [facility];  Service: Orthopedics;  Laterality: Right;   UPPER GI ENDOSCOPY  06/19/2015   Procedure: UPPER GI ENDOSCOPY;  Surgeon: Glenna Fellows, MD;  [facility];  Service: General;;   Patient Active Problem List   Diagnosis Date Noted   Thyroid dysfunction 02/25/2019   Overactive bladder 02/25/2019   Lichen sclerosus of female genitalia 02/25/2019   Status post total right knee replacement 11/25/2016   Unilateral primary osteoarthritis, right knee 10/22/2016   Trigger thumb, left thumb 10/22/2016   Morbid obesity 06/19/2015   GERD (gastroesophageal reflux disease) 01/20/2011   Colon polyp 01/20/2011   Hemorrhoids 01/20/2011    PCP: Jonny Ruiz  Margo Aye, MD REFERRING PROVIDER: Newman Pies, MD  REFERRING DIAG: dizziness  THERAPY DIAG:  Dizziness and giddiness  Abnormality of gait due to impairment of balance  ONSET DATE: about a year ago  Rationale for Evaluation and Treatment: Rehabilitation  SUBJECTIVE:   SUBJECTIVE STATEMENT: Dizziness if I close my eyes in the shower; does exercise at the senior center and feel off balance; one time fell backwards with trying to do any balance exercises.  Feels off balance; no reports of room spinning.  Saw Dr. Suszanne Conners, stated that she had some decreased reflex right ear.  Reports no issues with sitting or with lying down.   Pt accompanied by: self  PERTINENT HISTORY:  Patient reports left foot drop Takes Tai chi classes at senior center Has had both knees replaced  PAIN:  Are you  having pain? Yes: NPRS scale: 0-10/10 Pain location: left foot Pain description: aching Aggravating factors: standing and walking, exercise Relieving factors: rest   PRECAUTIONS: Fall  WEIGHT BEARING RESTRICTIONS: No  FALLS: Has patient fallen in last 6 months? Yes. Number of falls 1  LIVING ENVIRONMENT: Lives with: lives alone Lives in: House/apartment Stairs: No Has following equipment at home: Single point cane, Environmental consultant - 2 wheeled, Wheelchair (manual), bed side commode, Grab bars, and Ramped entry  PLOF: Independent  PATIENT GOALS: "have better balance so I won't fall"  OBJECTIVE:   DIAGNOSTIC FINDINGS: none  COGNITION: Overall cognitive status: Within functional limits for tasks assessed   SENSATION: WFL  EDEMA:  None noted  POSTURE:  rounded shoulders, forward head, and increased thoracic kyphosis  Cervical ROM:  grossly wfl throughout  Active AROM (deg) eval  Flexion   Extension   Right lateral flexion   Left lateral flexion   Right rotation   Left rotation   (Blank rows = not tested)  STRENGTH: not tested at eval  LOWER EXTREMITY MMT:   MMT Right eval Left eval  Hip flexion    Hip abduction    Hip adduction    Hip internal rotation    Hip external rotation    Knee flexion    Knee extension    Ankle dorsiflexion    Ankle plantarflexion    Ankle inversion    Ankle eversion    (Blank rows = not tested)  BED MOBILITY:  Not tested  TRANSFERS: Assistive device utilized: None  Sit to stand: Modified independence Stand to sit: Modified independence Chair to chair: Modified independence Floor:  not tested    FUNCTIONAL TESTS:  5 times sit to stand: 35.46 sec using hands to push up to stand Dynamic Gait Index: 11/24 SLS 29 sec on the left; 15 sec on the right  PATIENT SURVEYS:  DHI 18/100 mild handicap  VESTIBULAR ASSESSMENT:  GENERAL OBSERVATION: wears reading glasses only   SYMPTOM BEHAVIOR:  Subjective history: see  above  Non-Vestibular symptoms:  none  Type of dizziness: Imbalance (Disequilibrium) and Unsteady with head/body turns  Frequency: occassionally   Duration: short periods of time  Aggravating factors: Induced by motion: activity in general and standing and walking and closing eyes  Relieving factors: slow movements  Progression of symptoms: unchanged  OCULOMOTOR EXAM:  Ocular Alignment: normal  Ocular ROM: No Limitations  Spontaneous Nystagmus: absent  Gaze-Induced Nystagmus: absent  Smooth Pursuits: intact  Saccades: intact  Convergence/Divergence: not tested cm     VESTIBULAR - OCULAR REFLEX:   Slow VOR: Positive Bilaterally  VOR Cancellation: Comment: not tested  Head-Impulse Test: HIT Right: positive HIT  Left: positive  Dynamic Visual Acuity:  not tested   POSITIONAL TESTING: Other: not tested on eval  MOTION SENSITIVITY:  Motion Sensitivity Quotient Intensity: 0 = none, 1 = Lightheaded, 2 = Mild, 3 = Moderate, 4 = Severe, 5 = Vomiting  Intensity  1. Sitting to supine   2. Supine to L side   3. Supine to R side   4. Supine to sitting   5. L Hallpike-Dix   6. Up from L    7. R Hallpike-Dix   8. Up from R    9. Sitting, head tipped to L knee   10. Head up from L knee   11. Sitting, head tipped to R knee   12. Head up from R knee   13. Sitting head turns x5 0  14.Sitting head nods x5 0  15. In stance, 180 turn to L    16. In stance, 180 turn to R     FUNCTIONAL GAIT: DGI 1. Gait level surface (2) Mild Impairment: Walks 20', uses assistive devices, slower speed, mild gait deviations. 2. Change in gait speed (2) Mild Impairment: Is able to change speed but demonstrates mild gait deviations, or not gait deviations but unable to achieve a significant change in velocity, or uses an assistive device. 3. Gait with horizontal head turns (1) Moderate Impairment: Performs head turns with moderate change in gait velocity, slows down, staggers but recovers, can continue  to walk. 4. Gait with vertical head turns 1) Moderate Impairment: Performs head turns with moderate change in gait velocity, slows down, staggers but recovers, can continue to walk. 5. Gait and pivot turn (1) Moderate Impairment: Turns slowly, requires verbal cueing, requires several small steps to catch balance following turn and stop. 6. Step over obstacle (1) Moderate Impairment: Is able to step over box but must stop, then step over. May require verbal cueing. 7. Step around obstacles (2) Mild Impairment: Is able to step around both cones, but must slow down and adjust steps to clear cones. 8. Stairs (1) Moderate Impairment: Two feet to a stair, must use rail.  TOTAL SCORE: 11 / 24    VESTIBULAR TREATMENT:                                                                                                   DATE: 11/21/22 physical therapy evaluation and HEP instruction  Canalith Repositioning:  Comment: not tested Gaze Adaptation:   Habituation:  Seated Vertical Head Movements: number of reps: 10 and Seated Horizontal Head Movements: number of reps: 10 Other:   PATIENT EDUCATION: Education details: Patient educated on exam findings, POC, scope of PT, HEP, and what to expect next visit. Person educated: Patient Education method: Explanation, Demonstration, and Handouts Education comprehension: verbalized understanding, returned demonstration, verbal cues required, and tactile cues required   HOME EXERCISE PROGRAM: Access Code: [email or web address] URL: [email or web address] Date: 11/21/2022 Prepared by: [facility]  Exercises - Standing Single Leg Stance with Counter Support  - 2 x daily - 7 x weekly - 1 sets - 3 reps - Seated Gaze Stabilization with Head  Rotation  - 2 x daily - 7 x weekly - 1 sets - 10 reps - Seated Gaze Stabilization with Head Nod  - 2 x daily - 7 x weekly - 1 sets - 10 reps GOALS: Goals reviewed with patient? No  SHORT TERM GOALS: Target date:  12/12/2022 patient will be independent with initial HEP  Baseline: Goal status: INITIAL  2.  Patient will self report 30% improvement to improve tolerance for functional activity  Baseline:  Goal status: INITIAL   LONG TERM GOALS: Target date: 12/26/2022  Patient will be independent in self management strategies to improve quality of life and functional outcomes.  Baseline:  Goal status: INITIAL  2.  Patient will self report 50% improvement to improve tolerance for functional activity  Baseline:  Goal status: INITIAL  3.  Patient will improve score on DHI to 12 or less to demonstrate improved functional mobility  Baseline: 18 Goal status: INITIAL  4.  Patient will improve DGI score by 4 points (15) to improve dynamic gait and balance with functional activity Baseline: 11/24 Goal status: INITIAL  5.  Patient will improve 5 times sit to stand score from 35.46 sec to 30 sec or less to demonstrate improved functional mobility and increased lower extremity strength Baseline:  Goal status: INITIAL ASSESSMENT:  CLINICAL IMPRESSION: Patient is a 82 y.o. female who was seen today for physical therapy evaluation and treatment for dizziness. Patient presents to physical therapy with complaint of imbalance. Patient demonstrates increased vestibular symptoms with provocative testing which is negatively impacting patient ability to perform ADLs and functional mobility tasks. Patient will benefit from skilled physical therapy services to address these deficits to improve level of function with ADLs, functional mobility tasks, and reduce risk for falls.    OBJECTIVE IMPAIRMENTS: Abnormal gait, decreased activity tolerance, decreased balance, decreased coordination, decreased mobility, difficulty walking, decreased ROM, decreased strength, impaired perceived functional ability, and pain.   ACTIVITY LIMITATIONS: carrying, lifting, bending, standing, squatting, stairs, and locomotion  level  PARTICIPATION LIMITATIONS: meal prep, cleaning, community activity, and occupation  REHAB POTENTIAL: Good  CLINICAL DECISION MAKING: Evolving/moderate complexity  EVALUATION COMPLEXITY: Moderate   PLAN:  PT FREQUENCY: 1x/week  PT DURATION: 6 weeks  PLANNED INTERVENTIONS: Therapeutic exercises, Therapeutic activity, Neuromuscular re-education, Balance training, Gait training, Patient/Family education, Joint manipulation, Joint mobilization, Stair training, Orthotic/Fit training, DME instructions, Aquatic Therapy, Dry Needling, Electrical stimulation, Spinal manipulation, Spinal mobilization, Cryotherapy, Moist heat, Compression bandaging, scar mobilization, Splintting, Taping, Traction, Ultrasound, Ionotophoresis /ml Dexamethasone, and Manual therapy   PLAN FOR NEXT SESSION: Review of HEP and goals; MMT lower extremities; positional testing if appropriate but patient appears to have more issue with balance and VOR   1:14 PM, 11/21/22 Decoda Van [place] MPT [facility] [place] [phone] Ph:[phone]

## 2022-11-25 ENCOUNTER — Ambulatory Visit (HOSPITAL_COMMUNITY): Payer: Medicare Other

## 2022-11-25 DIAGNOSIS — R42 Dizziness and giddiness: Secondary | ICD-10-CM | POA: Diagnosis not present

## 2022-11-25 DIAGNOSIS — R2689 Other abnormalities of gait and mobility: Secondary | ICD-10-CM

## 2022-11-25 NOTE — Therapy (Signed)
OUTPATIENT PHYSICAL THERAPY VESTIBULAR TREATMENT     Patient Name: [name] MRN: [id number] DOB:1941/05/22, 82 y.o., female Today's Date: 11/25/2022  END OF SESSION:  PT End of Session - 11/25/22 0900     Visit Number 2    Number of Visits 6    Date for PT Re-Evaluation 12/26/22    Authorization Type Medicare part A and [employer]    Progress Note Due on Visit 10    PT Start Time 0902    PT Stop Time 0942    PT Time Calculation (min) 40 min    Activity Tolerance Patient tolerated treatment well    Behavior During Therapy WFL for tasks assessed/performed             Past Medical History:  Diagnosis Date   Allergy    takes Claritin daily   Arthritis    Complication of anesthesia    Constipation    takes Senokot daily   Depression    takes Lexapro daily   Family history of adverse reaction to anesthesia    sister has a hard time waking up   GERD (gastroesophageal reflux disease)    History of blood clots 1979   after C/S clot went to lung   History of bronchitis    3 yrs ago   History of migraine    Hypothyroidism    takes Synthroid daily   Joint pain    Joint swelling    Personal history of colonic polyps    benign   PONV (postoperative nausea and vomiting)    Rheumatic fever 1958   hospitalized for three months   Urinary leakage    takes Ditropan daily   Vitamin D deficiency    takes Vit D daily   Past Surgical History:  Procedure Laterality Date   APPENDECTOMY     incidental at time of C-section   BACK SURGERY  1986   cataract surgery Bilateral    CESAREAN SECTION  1979   CHOLECYSTECTOMY  1982   COLONOSCOPY  2004   Dr. Frankey Poot polyps, left sided diverticula. Path unavailable.    COLONOSCOPY  02/12/2011   Procedure: COLONOSCOPY;  Surgeon: Corbin Ade, MD;  Location: [facility];  Service: Endoscopy;  Laterality: N/A;   ESOPHAGOGASTRODUODENOSCOPY  02/12/2011   Procedure: ESOPHAGOGASTRODUODENOSCOPY (EGD);  Surgeon: Corbin Ade, MD;   Location: [facility];  Service: Endoscopy;  Laterality: N/A;   GASTRIC ROUX-EN-Y N/A 06/19/2015   Procedure: LAPAROSCOPIC ROUX-EN-Y GASTRIC BYPASS WITH UPPER ENDOSCOPY;  Surgeon: Glenna Fellows, MD;  Location: [facility];  Service: General;  Laterality: N/A;   KNEE SURGERY  1999   left   TOTAL KNEE ARTHROPLASTY  10/2009   left   TOTAL KNEE ARTHROPLASTY Right 11/25/2016   Procedure: RIGHT TOTAL KNEE ARTHROPLASTY;  Surgeon: Kathryne Hitch, MD;  Location: [facility];  Service: Orthopedics;  Laterality: Right;   UPPER GI ENDOSCOPY  06/19/2015   Procedure: UPPER GI ENDOSCOPY;  Surgeon: Glenna Fellows, MD;  Location: [facility];  Service: General;;   Patient Active Problem List   Diagnosis Date Noted   Thyroid dysfunction 02/25/2019   Overactive bladder 02/25/2019   Lichen sclerosus of female genitalia 02/25/2019   Status post total right knee replacement 11/25/2016   Unilateral primary osteoarthritis, right knee 10/22/2016   Trigger thumb, left thumb 10/22/2016   Morbid obesity 06/19/2015   GERD (gastroesophageal reflux disease) 01/20/2011   Colon polyp 01/20/2011   Hemorrhoids 01/20/2011    PCP: Jonny Ruiz  Margo Aye, MD REFERRING PROVIDER: Newman Pies, MD  REFERRING DIAG: dizziness  THERAPY DIAG:  Dizziness and giddiness  Abnormality of gait due to impairment of balance  ONSET DATE: about a year ago  Rationale for Evaluation and Treatment: Rehabilitation  SUBJECTIVE:   SUBJECTIVE STATEMENT: She won in bowling for her age division yesterday in Senior games.  Reports some neck soreness today   EVAL:Dizziness if I close my eyes in the shower; does exercise at the senior center and feel off balance; one time fell backwards with trying to do any balance exercises.  Feels off balance; no reports of room spinning.  Saw Dr. Suszanne Conners, stated that she had some decreased reflex right ear.  Reports no issues with sitting or with lying down.   Pt accompanied by: self  PERTINENT HISTORY:  Patient  reports left foot drop Takes Tai chi classes at senior center Has had both knees replaced  PAIN:  Are you having pain? Yes: NPRS scale: 0-10/10 Pain location: left foot Pain description: aching Aggravating factors: standing and walking, exercise Relieving factors: rest   PRECAUTIONS: Fall  WEIGHT BEARING RESTRICTIONS: No  FALLS: Has patient fallen in last 6 months? Yes. Number of falls 1  LIVING ENVIRONMENT: Lives with: lives alone Lives in: House/apartment Stairs: No Has following equipment at home: Single point cane, Environmental consultant - 2 wheeled, Wheelchair (manual), bed side commode, Grab bars, and Ramped entry  PLOF: Independent  PATIENT GOALS: "have better balance so I won't fall"  OBJECTIVE:   DIAGNOSTIC FINDINGS: none  COGNITION: Overall cognitive status: Within functional limits for tasks assessed   SENSATION: WFL  EDEMA:  None noted  POSTURE:  rounded shoulders, forward head, and increased thoracic kyphosis  Cervical ROM:  grossly wfl throughout  Active AROM (deg) eval  Flexion   Extension   Right lateral flexion   Left lateral flexion   Right rotation   Left rotation   (Blank rows = not tested)  STRENGTH: not tested at eval  LOWER EXTREMITY MMT:   MMT Right eval Left eval  Hip flexion 4+ 4  Hip abduction    Hip extension 4 4  Hip adduction    Hip internal rotation    Hip external rotation    Knee flexion 4+ 4+  Knee extension 5 5  Ankle dorsiflexion 5 5  Ankle plantarflexion    Ankle inversion    Ankle eversion    (Blank rows = not tested)  BED MOBILITY:  Not tested  TRANSFERS: Assistive device utilized: None  Sit to stand: Modified independence Stand to sit: Modified independence Chair to chair: Modified independence Floor:  not tested    FUNCTIONAL TESTS:  5 times sit to stand: 35.46 sec using hands to push up to stand Dynamic Gait Index: 11/24 SLS 29 sec on the left; 15 sec on the right  PATIENT SURVEYS:  DHI 18/100 mild  handicap  VESTIBULAR ASSESSMENT:  GENERAL OBSERVATION: wears reading glasses only   SYMPTOM BEHAVIOR:  Subjective history: see above  Non-Vestibular symptoms:  none  Type of dizziness: Imbalance (Disequilibrium) and Unsteady with head/body turns  Frequency: occassionally   Duration: short periods of time  Aggravating factors: Induced by motion: activity in general and standing and walking and closing eyes  Relieving factors: slow movements  Progression of symptoms: unchanged  OCULOMOTOR EXAM:  Ocular Alignment: normal  Ocular ROM: No Limitations  Spontaneous Nystagmus: absent  Gaze-Induced Nystagmus: absent  Smooth Pursuits: intact  Saccades: intact  Convergence/Divergence: not tested cm  VESTIBULAR - OCULAR REFLEX:   Slow VOR: Positive Bilaterally  VOR Cancellation: Comment: not tested  Head-Impulse Test: HIT Right: positive HIT Left: positive  Dynamic Visual Acuity:  not tested   POSITIONAL TESTING: Other: not tested on eval  MOTION SENSITIVITY:  Motion Sensitivity Quotient Intensity: 0 = none, 1 = Lightheaded, 2 = Mild, 3 = Moderate, 4 = Severe, 5 = Vomiting  Intensity  1. Sitting to supine 0  2. Supine to L side   3. Supine to R side   4. Supine to sitting 2  5. L Hallpike-Dix   6. Up from L    7. R Hallpike-Dix 2  8. Up from R    9. Sitting, head tipped to L knee   10. Head up from L knee   11. Sitting, head tipped to R knee   12. Head up from R knee   13. Sitting head turns x5 0  14.Sitting head nods x5 0  15. In stance, 180 turn to L    16. In stance, 180 turn to R     FUNCTIONAL GAIT: DGI 1. Gait level surface (2) Mild Impairment: Walks 20', uses assistive devices, slower speed, mild gait deviations. 2. Change in gait speed (2) Mild Impairment: Is able to change speed but demonstrates mild gait deviations, or not gait deviations but unable to achieve a significant change in velocity, or uses an assistive device. 3. Gait with horizontal head  turns (1) Moderate Impairment: Performs head turns with moderate change in gait velocity, slows down, staggers but recovers, can continue to walk. 4. Gait with vertical head turns 1) Moderate Impairment: Performs head turns with moderate change in gait velocity, slows down, staggers but recovers, can continue to walk. 5. Gait and pivot turn (1) Moderate Impairment: Turns slowly, requires verbal cueing, requires several small steps to catch balance following turn and stop. 6. Step over obstacle (1) Moderate Impairment: Is able to step over box but must stop, then step over. May require verbal cueing. 7. Step around obstacles (2) Mild Impairment: Is able to step around both cones, but must slow down and adjust steps to clear cones. 8. Stairs (1) Moderate Impairment: Two feet to a stair, must use rail.  TOTAL SCORE: 11 / 24    VESTIBULAR TREATMENT:                                                                                                   DATE:  11/25/22 Review of HEP and goals Seated: Gaze stabilization: Head nods and turns x 10 each with eyes on target  Standing: SLS: x 30" each  MMT's of lower extremities  Supine: Cervical retractions 3" hold x 5 Scapular retractions 3" hold x 5  Dix hall pike to right; no nystagmus but reports of mild dizziness so performed epley at end of session   11/21/22 physical therapy evaluation and HEP instruction  Canalith Repositioning:  Comment: not tested Gaze Adaptation:   Habituation:  Seated Vertical Head Movements: number of reps: 10 and Seated Horizontal Head Movements: number of reps: 10 Other:  PATIENT EDUCATION: Education details: Patient educated on exam findings, POC, scope of PT, HEP, and what to expect next visit. Person educated: Patient Education method: Explanation, Demonstration, and Handouts Education comprehension: verbalized understanding, returned demonstration, verbal cues required, and tactile cues  required   HOME EXERCISE PROGRAM: 11/25/22 scapular retractions, cervical retractions  Access Code: [email or web address] URL: [email or web address] Date: 11/21/2022 Prepared by: [facility]  Exercises - Standing Single Leg Stance with Counter Support  - 2 x daily - 7 x weekly - 1 sets - 3 reps - Seated Gaze Stabilization with Head Rotation  - 2 x daily - 7 x weekly - 1 sets - 10 reps - Seated Gaze Stabilization with Head Nod  - 2 x daily - 7 x weekly - 1 sets - 10 reps GOALS: Goals reviewed with patient? No  SHORT TERM GOALS: Target date: 12/12/2022 patient will be independent with initial HEP  Baseline: Goal status: IN PROGRESS  2.  Patient will self report 30% improvement to improve tolerance for functional activity  Baseline:  Goal status: IN PROGRESS   LONG TERM GOALS: Target date: 12/26/2022  Patient will be independent in self management strategies to improve quality of life and functional outcomes.  Baseline:  Goal status: IN PROGRESS  2.  Patient will self report 50% improvement to improve tolerance for functional activity  Baseline:  Goal status: IN PROGRESS  3.  Patient will improve score on DHI to 12 or less to demonstrate improved functional mobility  Baseline: 18 Goal status: IN PROGRESS  4.  Patient will improve DGI score by 4 points (15) to improve dynamic gait and balance with functional activity Baseline: 11/24 Goal status: IN PROGRESS  5.  Patient will improve 5 times sit to stand score from 35.46 sec to 30 sec or less to demonstrate improved functional mobility and increased lower extremity strength Baseline:  Goal status: IN PROGRESS ASSESSMENT:  CLINICAL IMPRESSION: Today's session started with a review of HEP and goals; patient verbalizes agreement with set rehab goals.  Tested MMT's today; noted hip flexion and extension weakness. Patient with some complaint of neck pain today and presents with forward head and rounded shoulders so added  postural exercises to HEP.   Tested right posterior canal with DH today; no visible nystagmus but patient does have complaint of some dizziness so performed Epley.  Patient will benefit from continued skilled therapy services to address deficits and promote return to optimal function.      Eval:Patient is a 82 y.o. female who was seen today for physical therapy evaluation and treatment for dizziness. Patient presents to physical therapy with complaint of imbalance. Patient demonstrates increased vestibular symptoms with provocative testing which is negatively impacting patient ability to perform ADLs and functional mobility tasks. Patient will benefit from skilled physical therapy services to address these deficits to improve level of function with ADLs, functional mobility tasks, and reduce risk for falls.    OBJECTIVE IMPAIRMENTS: Abnormal gait, decreased activity tolerance, decreased balance, decreased coordination, decreased mobility, difficulty walking, decreased ROM, decreased strength, impaired perceived functional ability, and pain.   ACTIVITY LIMITATIONS: carrying, lifting, bending, standing, squatting, stairs, and locomotion level  PARTICIPATION LIMITATIONS: meal prep, cleaning, community activity, and occupation  REHAB POTENTIAL: Good  CLINICAL DECISION MAKING: Evolving/moderate complexity  EVALUATION COMPLEXITY: Moderate   PLAN:  PT FREQUENCY: 1x/week  PT DURATION: 6 weeks  PLANNED INTERVENTIONS: Therapeutic exercises, Therapeutic activity, Neuromuscular re-education, Balance training, Gait training, Patient/Family education, Joint manipulation, Joint mobilization, Stair training, Orthotic/Fit training,  DME instructions, Aquatic Therapy, Dry Needling, Electrical stimulation, Spinal manipulation, Spinal mobilization, Cryotherapy, Moist heat, Compression bandaging, scar mobilization, Splintting, Taping, Traction, Ultrasound, Ionotophoresis /ml Dexamethasone, and Manual  therapy   PLAN FOR NEXT SESSION: retest right DH/posterior canal; other positional testing as appropriate ;  balance and VOR   9:42 AM, 11/25/22 Deo Mehringer Small Marcques Wrightsman MPT [facility] [place] [phone] [phone]

## 2022-12-03 ENCOUNTER — Ambulatory Visit (HOSPITAL_COMMUNITY): Payer: Medicare Other | Attending: Otolaryngology | Admitting: Physical Therapy

## 2022-12-03 DIAGNOSIS — R2689 Other abnormalities of gait and mobility: Secondary | ICD-10-CM

## 2022-12-03 DIAGNOSIS — R42 Dizziness and giddiness: Secondary | ICD-10-CM

## 2022-12-03 NOTE — Therapy (Addendum)
OUTPATIENT PHYSICAL THERAPY VESTIBULAR TREATMENT     Patient Name: LOKI ESCARCEGA MRN: [id number] DOB:[birth year], 82 y.o., female Today's Date: 12/10/2022    END OF SESSION:   PT End of Session - 12/03/22 1205       Visit Number 3    Number of Visits 6     Date for PT Re-Evaluation 12/26/22     Authorization Type Medicare part A and [employer]     Progress Note Due on Visit 10     PT Start Time 1124    PT Stop Time 1205    PT Time Calculation (min) 41 min     Activity Tolerance Patient tolerated treatment well     Behavior During Therapy WFL for tasks assessed/performed                      Past Medical History:  Diagnosis Date   Allergy    takes Claritin daily   Arthritis    Complication of anesthesia    Constipation    takes Senokot daily   Depression    takes Lexapro daily   Family history of adverse reaction to anesthesia    sister has a hard time waking up   GERD (gastroesophageal reflux disease)    History of blood clots 1979   after C/S clot went to lung   History of bronchitis    3 yrs ago   History of migraine    Hypothyroidism    takes Synthroid daily   Joint pain    Joint swelling    Personal history of colonic polyps    benign   PONV (postoperative nausea and vomiting)    Rheumatic fever 1958   hospitalized for three months   Urinary leakage    takes Ditropan daily   Vitamin D deficiency    takes Vit D daily   Past Surgical History:  Procedure Laterality Date   APPENDECTOMY     incidental at time of C-section   BACK SURGERY  1986   cataract surgery Bilateral    CESAREAN SECTION  1979   CHOLECYSTECTOMY  1982   COLONOSCOPY  2004   Dr. Frankey Poot polyps, left sided diverticula. Path unavailable.    COLONOSCOPY  02/12/2011   Procedure: COLONOSCOPY;  Surgeon: Corbin Ade, MD;  Location: [facility];  Service: Endoscopy;  Laterality: N/A;   ESOPHAGOGASTRODUODENOSCOPY  02/12/2011   Procedure: ESOPHAGOGASTRODUODENOSCOPY (EGD);  Surgeon:  Corbin Ade, MD;  Location: [facility];  Service: Endoscopy;  Laterality: N/A;   GASTRIC ROUX-EN-Y N/A 06/19/2015   Procedure: LAPAROSCOPIC ROUX-EN-Y GASTRIC BYPASS WITH UPPER ENDOSCOPY;  Surgeon: Glenna Fellows, MD;  Location: [facility];  Service: General;  Laterality: N/A;   KNEE SURGERY  1999   left   TOTAL KNEE ARTHROPLASTY  10/2009   left   TOTAL KNEE ARTHROPLASTY Right 11/25/2016   Procedure: RIGHT TOTAL KNEE ARTHROPLASTY;  Surgeon: Kathryne Hitch, MD;  Location: [facility];  Service: Orthopedics;  Laterality: Right;   UPPER GI ENDOSCOPY  06/19/2015   Procedure: UPPER GI ENDOSCOPY;  Surgeon: Glenna Fellows, MD;  Location: [facility];  Service: General;;   Patient Active Problem List   Diagnosis Date Noted   Thyroid dysfunction 02/25/2019   Overactive bladder 02/25/2019   Lichen sclerosus of female genitalia 02/25/2019   Status post total right knee replacement 11/25/2016   Unilateral primary osteoarthritis, right knee 10/22/2016   Trigger thumb, left thumb 10/22/2016   Morbid obesity (HCC) 06/19/2015  GERD (gastroesophageal reflux disease) 01/20/2011   Colon polyp 01/20/2011   Hemorrhoids 01/20/2011    PCP: Nita Sells, MD REFERRING PROVIDER: Newman Pies, MD  REFERRING DIAG: dizziness  THERAPY DIAG:  Dizziness and giddiness  Abnormality of gait due to impairment of balance  ONSET DATE: about a year ago  Rationale for Evaluation and Treatment: Rehabilitation   SUBJECTIVE STATEMENT:  Pt states that she does not really have dizziness it's more imbalance.  States her main concern is her neck pain. Her neck hurts any time she attempts to turn her head.   PERTINENT HISTORY:  Patient reports left foot drop [facility]akes [facility] chi [facility] Has had both knees replaced  PAIN:  Are you having pain? Yes: NPRS scale: 8/10 Pain location: left cervical area.  Pain description: aching Aggravating factors: rotating her head.  Relieving factors: rest   PRECAUTIONS:  Fall  WEIGHT BEARING RESTRICTIONS: No  FALLS: Has patient fallen in last 6 months? Yes. Number of falls 1  LIVING ENVIRONMENT: Lives with: lives alone Lives in: House/apartment Stairs: No Has following equipment at home: Single point cane, Environmental consultant - 2 wheeled, Wheelchair (manual), bed side commode, Grab bars, and Ramped entry  PLOF: Independent  PATIENT GOALS: "have better balance so I won't fall"  OBJECTIVE:   DIAGNOSTIC FINDINGS: none  COGNITION: Overall cognitive status: Within functional limits for tasks assessed   POSTURE:  rounded shoulders, forward head, and increased thoracic kyphosis  Cervical ROM:  grossly wfl throughout  Active AROM (deg) eval  Flexion   Extension   Right lateral flexion   Left lateral flexion   Right rotation   Left rotation   (Blank rows = not tested)  STRENGTH: not tested at eval  LOWER EXTREMITY MMT:   MMT Right eval Left eval  Hip flexion 4+ 4  Hip abduction    Hip extension 4 4  Hip adduction    Hip internal rotation    Hip external rotation    Knee flexion 4+ 4+  Knee extension 5 5  Ankle dorsiflexion 5 5  Ankle plantarflexion    Ankle inversion    Ankle eversion    (Blank rows = not tested)  BED MOBILITY:  Not tested  TRANSFERS: Assistive device utilized: None  Sit to stand: Modified independence Stand to sit: Modified independence Chair to chair: Modified independence Floor:  not tested    FUNCTIONAL TESTS:  5 times sit to stand: 35.46 sec using hands to push up to stand Dynamic Gait Index: 11/24 SLS 29 sec on the left; 15 sec on the right  PATIENT SURVEYS:  DHI 18/100 mild handicap  VESTIBULAR ASSESSMENT:  GENERAL OBSERVATION: wears reading glasses only   SYMPTOM BEHAVIOR:  Subjective history: see above  Non-Vestibular symptoms:  none  Type of dizziness: Imbalance (Disequilibrium) and Unsteady with head/body turns  Frequency: occassionally   Duration: short periods of time  Aggravating  factors: Induced by motion: activity in general and standing and walking and closing eyes  Relieving factors: slow movements  Progression of symptoms: unchanged  OCULOMOTOR EXAM:  Ocular Alignment: normal  Ocular ROM: No Limitations  Spontaneous Nystagmus: absent  Gaze-Induced Nystagmus: absent  Smooth Pursuits: intact  Saccades: intact  Convergence/Divergence: not tested cm     VESTIBULAR - OCULAR REFLEX:   Slow VOR: Positive Bilaterally  VOR Cancellation: Comment: not tested  Head-Impulse Test: HIT Right: positive HIT Left: positive  Dynamic Visual Acuity:  not tested   POSITIONAL TESTING: Other: not tested on eval  MOTION SENSITIVITY:  Motion Sensitivity Quotient Intensity: 0 = none, 1 = Lightheaded, 2 = Mild, 3 = Moderate, 4 = Severe, 5 = Vomiting  Intensity  1. Sitting to supine 0  2. Supine to L side   3. Supine to R side   4. Supine to sitting 2  5. L Hallpike-Dix   6. Up from L    7. R Hallpike-Dix 2  8. Up from R    9. Sitting, head tipped to L knee   10. Head up from L knee   11. Sitting, head tipped to R knee   12. Head up from R knee   13. Sitting head turns x5 0  14.Sitting head nods x5 0  15. In stance, 180 turn to L    16. In stance, 180 turn to R     FUNCTIONAL GAIT: DGI 1. Gait level surface (2) Mild Impairment: Walks 20', uses assistive devices, slower speed, mild gait deviations. 2. Change in gait speed (2) Mild Impairment: Is able to change speed but demonstrates mild gait deviations, or not gait deviations but unable to achieve a significant change in velocity, or uses an assistive device. 3. Gait with horizontal head turns (1) Moderate Impairment: Performs head turns with moderate change in gait velocity, slows down, staggers but recovers, can continue to walk. 4. Gait with vertical head turns 1) Moderate Impairment: Performs head turns with moderate change in gait velocity, slows down, staggers but recovers, can continue to walk. 5. Gait  and pivot turn (1) Moderate Impairment: Turns slowly, requires verbal cueing, requires several small steps to catch balance following turn and stop. 6. Step over obstacle (1) Moderate Impairment: Is able to step over box but must stop, then step over. May require verbal cueing. 7. Step around obstacles (2) Mild Impairment: Is able to step around both cones, but must slow down and adjust steps to clear cones. 8. Stairs (1) Moderate Impairment: Two feet to a stair, must use rail.  TOTAL SCORE: 11 / 24    VESTIBULAR TREATMENT:                                                                                                   DATE:  12/03/2022 Supine: manual traction x 10  Cervical retraction x 10 Scapular retraction x 10 Cervical rotation x 10  Isometric for sidebend as well as extension x 10  Sitting: Tall posture x 5  Scapular retraction x 10 Cervical retraction x 10 ] SB x 5  Standing : Semi tandem stance 3 x for 10" each Marching x 10  NBS x 5 with head turns    11/25/22 Review of HEP and goals Seated: Gaze stabilization: Head nods and turns x 10 each with eyes on target  Standing: SLS: x 30" each  MMT's of lower extremities  Supine: Cervical retractions 3" hold x 5 Scapular retractions 3" hold x 5  Dix hall pike to right; no nystagmus but reports of mild dizziness so performed epley at end of session    11/21/22 physical therapy evaluation and HEP instruction  Canalith Repositioning:  Comment: not tested Gaze Adaptation:   Habituation:  Seated Vertical Head Movements: number of reps: 10 and Seated Horizontal Head Movements: number of reps: 10 Other:   PATIENT EDUCATION: Education details: Patient educated on exam findings, POC, scope of PT, HEP, and what to expect next visit. Person educated: Patient Education method: Explanation, Demonstration, and Handouts Education comprehension: verbalized understanding, returned demonstration, verbal cues required, and  tactile cues required   HOME EXERCISE PROGRAM: 12/03/22 - Supine Cervical Rotation AROM on Pillow  - 2 x daily - 7 x weekly - 3 sets - 10 reps - 3" hold - Supine Isometric Neck Sidebend  - 2 x daily - 7 x weekly - 3 sets - 10 reps - 3" hold - Supine Isometric Neck Extension  - 2 x daily - 7 x weekly - 3 sets - 10 reps - 3" hold - Tandem Stance  - 2 x daily - 7 x weekly - 1 sets - 10 reps - 10" hold - Standing Marching  - 2 x daily - 7 x weekly - 1 sets - 10 reps - 5" hold 11/25/22 scapular retractions, cervical retractions  Access Code: [email or web address] URL: [email or web address] Date: 11/21/2022 Prepared by: [facility]  Exercises - Standing Single Leg Stance with Counter Support  - 2 x daily - 7 x weekly - 1 sets - 3 reps - Seated Gaze Stabilization with Head Rotation  - 2 x daily - 7 x weekly - 1 sets - 10 reps - Seated Gaze Stabilization with Head Nod  - 2 x daily - 7 x weekly - 1 sets - 10 reps GOALS: Goals reviewed with patient? No  SHORT TERM GOALS: Target date: 12/12/2022 patient will be independent with initial HEP  Baseline: Goal status: IN PROGRESS  2.  Patient will self report 30% improvement to improve tolerance for functional activity  Baseline:  Goal status: IN PROGRESS   LONG TERM GOALS: Target date: 12/26/2022  Patient will be independent in self management strategies to improve quality of life and functional outcomes.  Baseline:  Goal status: IN PROGRESS  2.  Patient will self report 50% improvement to improve tolerance for functional activity  Baseline:  Goal status: IN PROGRESS  3.  Patient will improve score on DHI to 12 or less to demonstrate improved functional mobility  Baseline: 18 Goal status: IN PROGRESS  4.  Patient will improve DGI score by 4 points (15) to improve dynamic gait and balance with functional activity Baseline: 11/24 Goal status: IN PROGRESS  5.  Patient will improve 5 times sit to stand score from 35.46 sec to 30 sec  or less to demonstrate improved functional mobility and increased lower extremity strength Baseline:  Goal status: IN PROGRESS ASSESSMENT:  CLINICAL IMPRESSION: Pt states that her pain decreased to no greater than a 2 following treatment today.  Therapist added cervical mobility and stability exercises as well as posture and balance.  Pt will continue to benefit from skilled care to improve her pain, decreased balance and decreased cervical ROM>  OBJECTIVE IMPAIRMENTS: Abnormal gait, decreased activity tolerance, decreased balance, decreased coordination, decreased mobility, difficulty walking, decreased ROM, decreased strength, impaired perceived functional ability, and pain.   ACTIVITY LIMITATIONS: carrying, lifting, bending, standing, squatting, stairs, and locomotion level  PARTICIPATION LIMITATIONS: meal prep, cleaning, community activity, and occupation  REHAB POTENTIAL: Good  CLINICAL DECISION MAKING: Evolving/moderate complexity  EVALUATION COMPLEXITY: Moderate   PLAN:  PT FREQUENCY: 1x/week  PT DURATION: 6 weeks  PLANNED INTERVENTIONS: Therapeutic exercises, Therapeutic activity, Neuromuscular re-education, Balance training, Gait training, Patient/Family education, Joint manipulation, Joint mobilization, Stair training, Orthotic/Fit training, DME instructions, Aquatic Therapy, Dry Needling, Electrical stimulation, Spinal manipulation, Spinal mobilization, Cryotherapy, Moist heat, Compression bandaging, scar mobilization, Splintting, Taping, Traction, Ultrasound, Ionotophoresis 4mg /ml Dexamethasone, and Manual therapy   PLAN FOR NEXT SESSION:balance, postural and cervical stability exercises.   Virgina Organ, PT CLT [phone]

## 2022-12-11 ENCOUNTER — Ambulatory Visit (HOSPITAL_COMMUNITY): Payer: Medicare Other | Admitting: Physical Therapy

## 2022-12-11 DIAGNOSIS — R2689 Other abnormalities of gait and mobility: Secondary | ICD-10-CM

## 2022-12-11 DIAGNOSIS — R42 Dizziness and giddiness: Secondary | ICD-10-CM | POA: Diagnosis not present

## 2022-12-11 NOTE — Therapy (Signed)
OUTPATIENT PHYSICAL THERAPY VESTIBULAR TREATMENT     Patient Name: [name] MRN: [id number] DOB:17-Mar-1941, 82 y.o., female Today's Date: 12/11/2022   PT End of Session - 12/11/22 0907     Visit Number 4    Number of Visits 6    Date for PT Re-Evaluation 12/26/22    Authorization Type Medicare part A and [employer]    Progress Note Due on Visit 10    PT Start Time 0905    PT Stop Time 0943    PT Time Calculation (min) 38 min    Activity Tolerance Patient tolerated treatment well    Behavior During Therapy [facility] for tasks assessed/performed             PT End of Session - 12/11/22 0907     Visit Number 4    Number of Visits 6    Date for PT Re-Evaluation 12/26/22    Authorization Type Medicare part A and UHC    Progress Note Due on Visit 10    PT Start Time 0905    PT Stop Time 0943    PT Time Calculation (min) 38 min    Activity Tolerance Patient tolerated treatment well    Behavior During Therapy [facility] for tasks assessed/performed              PT End of Session - 12/11/22 0907     Visit Number 4    Number of Visits 6    Date for PT Re-Evaluation 12/26/22    Authorization Type Medicare part A and UHC    Progress Note Due on Visit 10    PT Start Time 0905    PT Stop Time 0943    PT Time Calculation (min) 38 min    Activity Tolerance Patient tolerated treatment well    Behavior During Therapy [facility] for tasks assessed/performed            END OF SESSION: END OF SESSION:   PT End of Session - 12/11/22 0907     Visit Number 4    Number of Visits 6    Date for PT Re-Evaluation 12/26/22    Authorization Type Medicare part A and UHC    Progress Note Due on Visit 10    PT Start Time 0905    PT Stop Time 0943    PT Time Calculation (min) 38 min    Activity Tolerance Patient tolerated treatment well    Behavior During Therapy [facility] for tasks assessed/performed              PT End of Session - 12/11/22 0907     Visit Number 4    Number of Visits 6     Date for PT Re-Evaluation 12/26/22    Authorization Type Medicare part A and UHC    Progress Note Due on Visit 10    PT Start Time 0905    PT Stop Time 0943    PT Time Calculation (min) 38 min    Activity Tolerance Patient tolerated treatment well    Behavior During Therapy [facility] for tasks assessed/performed              Past Medical History:  Diagnosis Date   Allergy    takes Claritin daily   Arthritis    Complication of anesthesia    Constipation    takes Senokot daily   Depression    takes Lexapro daily   Family history of adverse reaction to anesthesia  sister has a hard time waking up   GERD (gastroesophageal reflux disease)    History of blood clots 1979   after C/S clot went to lung   History of bronchitis    3 yrs ago   History of migraine    Hypothyroidism    takes Synthroid daily   Joint pain    Joint swelling    Personal history of colonic polyps    benign   PONV (postoperative nausea and vomiting)    Rheumatic fever 1958   hospitalized for three months   Urinary leakage    takes Ditropan daily   Vitamin D deficiency    takes Vit D daily   Past Surgical History:  Procedure Laterality Date   APPENDECTOMY     incidental at time of C-section   BACK SURGERY  1986   cataract surgery Bilateral    CESAREAN SECTION  1979   CHOLECYSTECTOMY  1982   COLONOSCOPY  2004   Dr. Frankey Poot polyps, left sided diverticula. Path unavailable.    COLONOSCOPY  02/12/2011   Procedure: COLONOSCOPY;  Surgeon: Corbin Ade, MD;  Location: [facility];  Service: Endoscopy;  Laterality: N/A;   ESOPHAGOGASTRODUODENOSCOPY  02/12/2011   Procedure: ESOPHAGOGASTRODUODENOSCOPY (EGD);  Surgeon: Corbin Ade, MD;  Location: [facility];  Service: Endoscopy;  Laterality: N/A;   GASTRIC ROUX-EN-Y N/A 06/19/2015   Procedure: LAPAROSCOPIC ROUX-EN-Y GASTRIC BYPASS WITH UPPER ENDOSCOPY;  Surgeon: Glenna Fellows, MD;  Location: [facility];  Service: General;  Laterality: N/A;    KNEE SURGERY  1999   left   TOTAL KNEE ARTHROPLASTY  10/2009   left   TOTAL KNEE ARTHROPLASTY Right 11/25/2016   Procedure: RIGHT TOTAL KNEE ARTHROPLASTY;  Surgeon: Kathryne Hitch, MD;  Location: [facility];  Service: Orthopedics;  Laterality: Right;   UPPER GI ENDOSCOPY  06/19/2015   Procedure: UPPER GI ENDOSCOPY;  Surgeon: Glenna Fellows, MD;  Location: [facility];  Service: General;;   Patient Active Problem List   Diagnosis Date Noted   Thyroid dysfunction 02/25/2019   Overactive bladder 02/25/2019   Lichen sclerosus of female genitalia 02/25/2019   Status post total right knee replacement 11/25/2016   Unilateral primary osteoarthritis, right knee 10/22/2016   Trigger thumb, left thumb 10/22/2016   Morbid obesity (HCC) 06/19/2015   GERD (gastroesophageal reflux disease) 01/20/2011   Colon polyp 01/20/2011   Hemorrhoids 01/20/2011    PCP: Nita Sells, MD REFERRING PROVIDER: Newman Pies, MD  REFERRING DIAG: dizziness  THERAPY DIAG:  Dizziness and giddiness  Abnormality of gait due to impairment of balance  ONSET DATE: about a year ago  Rationale for Evaluation and Treatment: Rehabilitation   SUBJECTIVE STATEMENT:  Dizziness is better. Still has trouble with balance. Exercise really helped with neck pain. Got a new recliner at home which has helped with this too.   PERTINENT HISTORY:  Patient reports left foot drop [facility] chi [facility] Has had both knees replaced  PAIN:  Are you having pain? Yes: NPRS scale: 3/10 Pain location: left cervical area.  Pain description: aching Aggravating factors: rotating her head.  Relieving factors: rest   PRECAUTIONS: Fall  WEIGHT BEARING RESTRICTIONS: No  FALLS: Has patient fallen in last 6 months? Yes. Number of falls 1  LIVING ENVIRONMENT: Lives with: lives alone Lives in: House/apartment Stairs: No Has following equipment at home: Single point cane, Walker - 2 wheeled, Wheelchair (manual), bed side  commode, Grab bars, and Ramped entry  PLOF: Independent  PATIENT GOALS: "have better balance so I won't fall"  OBJECTIVE:   DIAGNOSTIC FINDINGS: none  COGNITION: Overall cognitive status: Within functional limits for tasks assessed   POSTURE:  rounded shoulders, forward head, and increased thoracic kyphosis  Cervical ROM:  grossly wfl throughout  Active AROM (deg) eval  Flexion   Extension   Right lateral flexion   Left lateral flexion   Right rotation   Left rotation   (Blank rows = not tested)  STRENGTH: not tested at eval  LOWER EXTREMITY MMT:   MMT Right eval Left eval  Hip flexion 4+ 4  Hip abduction    Hip extension 4 4  Hip adduction    Hip internal rotation    Hip external rotation    Knee flexion 4+ 4+  Knee extension 5 5  Ankle dorsiflexion 5 5  Ankle plantarflexion    Ankle inversion    Ankle eversion    (Blank rows = not tested)  BED MOBILITY:  Not tested  TRANSFERS: Assistive device utilized: None  Sit to stand: Modified independence Stand to sit: Modified independence Chair to chair: Modified independence Floor:  not tested    FUNCTIONAL TESTS:  5 times sit to stand: 35.46 sec using hands to push up to stand Dynamic Gait Index: 11/24 SLS 29 sec on the left; 15 sec on the right  PATIENT SURVEYS:  DHI 18/100 mild handicap  VESTIBULAR ASSESSMENT:  GENERAL OBSERVATION: wears reading glasses only   SYMPTOM BEHAVIOR:  Subjective history: see above  Non-Vestibular symptoms:  none  Type of dizziness: Imbalance (Disequilibrium) and Unsteady with head/body turns  Frequency: occassionally   Duration: short periods of time  Aggravating factors: Induced by motion: activity in general and standing and walking and closing eyes  Relieving factors: slow movements  Progression of symptoms: unchanged  OCULOMOTOR EXAM:  Ocular Alignment: normal  Ocular ROM: No Limitations  Spontaneous Nystagmus: absent  Gaze-Induced Nystagmus:  absent  Smooth Pursuits: intact  Saccades: intact  Convergence/Divergence: not tested cm     VESTIBULAR - OCULAR REFLEX:   Slow VOR: Positive Bilaterally  VOR Cancellation: Comment: not tested  Head-Impulse Test: HIT Right: positive HIT Left: positive  Dynamic Visual Acuity:  not tested   POSITIONAL TESTING: Other: not tested on eval  MOTION SENSITIVITY:  Motion Sensitivity Quotient Intensity: 0 = none, 1 = Lightheaded, 2 = Mild, 3 = Moderate, 4 = Severe, 5 = Vomiting  Intensity  1. Sitting to supine 0  2. Supine to L side   3. Supine to R side   4. Supine to sitting 2  5. L Hallpike-Dix   6. Up from L    7. R Hallpike-Dix 2  8. Up from R    9. Sitting, head tipped to L knee   10. Head up from L knee   11. Sitting, head tipped to R knee   12. Head up from R knee   13. Sitting head turns x5 0  14.Sitting head nods x5 0  15. In stance, 180 turn to L    16. In stance, 180 turn to R     FUNCTIONAL GAIT: DGI 1. Gait level surface (2) Mild Impairment: Walks 20', uses assistive devices, slower speed, mild gait deviations. 2. Change in gait speed (2) Mild Impairment: Is able to change speed but demonstrates mild gait deviations, or not gait deviations but unable to achieve a significant change in velocity, or uses an assistive device. 3. Gait with horizontal head turns (1)  Moderate Impairment: Performs head turns with moderate change in gait velocity, slows down, staggers but recovers, can continue to walk. 4. Gait with vertical head turns 1) Moderate Impairment: Performs head turns with moderate change in gait velocity, slows down, staggers but recovers, can continue to walk. 5. Gait and pivot turn (1) Moderate Impairment: Turns slowly, requires verbal cueing, requires several small steps to catch balance following turn and stop. 6. Step over obstacle (1) Moderate Impairment: Is able to step over box but must stop, then step over. May require verbal cueing. 7. Step around  obstacles (2) Mild Impairment: Is able to step around both cones, but must slow down and adjust steps to clear cones. 8. Stairs (1) Moderate Impairment: Two feet to a stair, must use rail.  TOTAL SCORE: 11 / 24    VESTIBULAR TREATMENT:                                                                                                   DATE:  12/11/22 Chin tuck 10 x 5"  Scap retraction 10 x 5"  UT stretch 3 x 20" Cervical sidebend iso 10 x 5" each  Band rows RTB 2 x 10 Shoulder extension RTB 2 x 10  Tandem stance 2 x 30" NBOS with head turns x 10 each   12/03/2022 Supine: manual traction x 10  Cervical retraction x 10 Scapular retraction x 10 Cervical rotation x 10  Isometric for sidebend as well as extension x 10  Sitting: Tall posture x 5  Scapular retraction x 10 Cervical retraction x 10 ] SB x 5  Standing : Semi tandem stance 3 x for 10" each Marching x 10  NBS x 5 with head turns    11/25/22 Review of HEP and goals Seated: Gaze stabilization: Head nods and turns x 10 each with eyes on target  Standing: SLS: x 30" each  MMT's of lower extremities  Supine: Cervical retractions 3" hold x 5 Scapular retractions 3" hold x 5  Dix hall pike to right; no nystagmus but reports of mild dizziness so performed epley at end of session    11/21/22 physical therapy evaluation and HEP instruction  Canalith Repositioning:  Comment: not tested Gaze Adaptation:   Habituation:  Seated Vertical Head Movements: number of reps: 10 and Seated Horizontal Head Movements: number of reps: 10 Other:   PATIENT EDUCATION: Education details: Patient educated on exam findings, POC, scope of PT, HEP, and what to expect next visit. Person educated: Patient Education method: Explanation, Demonstration, and Handouts Education comprehension: verbalized understanding, returned demonstration, verbal cues required, and tactile cues required   HOME EXERCISE PROGRAM: 12/03/22 - Supine  Cervical Rotation AROM on Pillow  - 2 x daily - 7 x weekly - 3 sets - 10 reps - 3" hold - Supine Isometric Neck Sidebend  - 2 x daily - 7 x weekly - 3 sets - 10 reps - 3" hold - Supine Isometric Neck Extension  - 2 x daily - 7 x weekly - 3 sets - 10 reps - 3" hold - Tandem Stance  -  2 x daily - 7 x weekly - 1 sets - 10 reps - 10" hold - Standing Marching  - 2 x daily - 7 x weekly - 1 sets - 10 reps - 5" hold 11/25/22 scapular retractions, cervical retractions  Access Code: [email or web address] URL: [email or web address] 12/[email or web address] - Standing Shoulder Row with Anchored Resistance  - 1-2 x daily - 7 x weekly - 2 sets - 10 reps - Shoulder extension with resistance - Neutral  - 1-2 x daily - 7 x weekly - 2 sets - 10 reps - Seated Upper Trapezius Stretch  - 2 x daily - 7 x weekly - 1 sets - 3 reps - 30 second hold  Date: 11/21/2022 Prepared by: [facility]  Exercises - Standing Single Leg Stance with Counter Support  - 2 x daily - 7 x weekly - 1 sets - 3 reps - Seated Gaze Stabilization with Head Rotation  - 2 x daily - 7 x weekly - 1 sets - 10 reps - Seated Gaze Stabilization with Head Nod  - 2 x daily - 7 x weekly - 1 sets - 10 reps GOALS: Goals reviewed with patient? No  SHORT TERM GOALS: Target date: 12/12/2022 patient will be independent with initial HEP  Baseline: Goal status: IN PROGRESS  2.  Patient will self report 30% improvement to improve tolerance for functional activity  Baseline:  Goal status: IN PROGRESS   LONG TERM GOALS: Target date: 12/26/2022  Patient will be independent in self management strategies to improve quality of life and functional outcomes.  Baseline:  Goal status: IN PROGRESS  2.  Patient will self report 50% improvement to improve tolerance for functional activity  Baseline:  Goal status: IN PROGRESS  3.  Patient will improve score on DHI to 12 or less to demonstrate improved functional mobility  Baseline: 18 Goal status: IN PROGRESS  4.   Patient will improve DGI score by 4 points (15) to improve dynamic gait and balance with functional activity Baseline: 11/24 Goal status: IN PROGRESS  5.  Patient will improve 5 times sit to stand score from 35.46 sec to 30 sec or less to demonstrate improved functional mobility and increased lower extremity strength Baseline:  Goal status: IN PROGRESS  ASSESSMENT:  CLINICAL IMPRESSION: Patient tolerated session well. Progressed postural strengthening with added band rows and extensions. Patient required demonstration and verbal cues for improved form and appropriate scapular retraction. Added to HEP. Patient shows improving standing balance. Patient will continue to benefit from skilled therapy services to reduce remaining deficits and improve functional ability.   OBJECTIVE IMPAIRMENTS: Abnormal gait, decreased activity tolerance, decreased balance, decreased coordination, decreased mobility, difficulty walking, decreased ROM, decreased strength, impaired perceived functional ability, and pain.   ACTIVITY LIMITATIONS: carrying, lifting, bending, standing, squatting, stairs, and locomotion level  PARTICIPATION LIMITATIONS: meal prep, cleaning, community activity, and occupation  REHAB POTENTIAL: Good  CLINICAL DECISION MAKING: Evolving/moderate complexity   EVALUATION COMPLEXITY: Moderate   PLAN:  PT FREQUENCY: 1x/week  PT DURATION: 6 weeks  PLANNED INTERVENTIONS: Therapeutic exercises, Therapeutic activity, Neuromuscular re-education, Balance training, Gait training, Patient/Family education, Joint manipulation, Joint mobilization, Stair training, Orthotic/Fit training, DME instructions, Aquatic Therapy, Dry Needling, Electrical stimulation, Spinal manipulation, Spinal mobilization, Cryotherapy, Moist heat, Compression bandaging, scar mobilization, Splintting, Taping, Traction, Ultrasound, Ionotophoresis 4mg /ml Dexamethasone, and Manual therapy   PLAN FOR NEXT SESSION:balance,  postural and cervical stability exercises.    10:59 AM, 12/11/22 Georges Lynch PT DPT  Physical Therapist with [facility]  [facility]  [phone]

## 2022-12-16 ENCOUNTER — Encounter (HOSPITAL_COMMUNITY): Payer: Medicare Other | Admitting: Physical Therapy

## 2022-12-16 DIAGNOSIS — N3946 Mixed incontinence: Secondary | ICD-10-CM | POA: Diagnosis not present

## 2022-12-16 DIAGNOSIS — R350 Frequency of micturition: Secondary | ICD-10-CM | POA: Diagnosis not present

## 2022-12-17 ENCOUNTER — Encounter (HOSPITAL_COMMUNITY): Payer: Medicare Other | Admitting: Physical Therapy

## 2022-12-18 ENCOUNTER — Ambulatory Visit (HOSPITAL_COMMUNITY): Payer: Medicare Other

## 2022-12-18 DIAGNOSIS — R42 Dizziness and giddiness: Secondary | ICD-10-CM | POA: Diagnosis not present

## 2022-12-18 DIAGNOSIS — R2689 Other abnormalities of gait and mobility: Secondary | ICD-10-CM

## 2022-12-18 NOTE — Therapy (Signed)
OUTPATIENT PHYSICAL THERAPY VESTIBULAR TREATMENT     Patient Name: [name] MRN: [id number] DOB:10/18/1940, 82 y.o., female Today's Date: 12/18/2022   PT End of Session - 12/18/22 1116     Visit Number 5    Number of Visits 6    Date for PT Re-Evaluation 12/26/22    Authorization Type Medicare part A and [employer]    Progress Note Due on Visit 10    PT Start Time 1116    PT Stop Time 1156    PT Time Calculation (min) 40 min    Activity Tolerance Patient tolerated treatment well    Behavior During Therapy WFL for tasks assessed/performed                Past Medical History:  Diagnosis Date   Allergy    takes Claritin daily   Arthritis    Complication of anesthesia    Constipation    takes Senokot daily   Depression    takes Lexapro daily   Family history of adverse reaction to anesthesia    sister has a hard time waking up   GERD (gastroesophageal reflux disease)    History of blood clots 1979   after C/S clot went to lung   History of bronchitis    3 yrs ago   History of migraine    Hypothyroidism    takes Synthroid daily   Joint pain    Joint swelling    Personal history of colonic polyps    benign   PONV (postoperative nausea and vomiting)    Rheumatic fever 1958   hospitalized for three months   Urinary leakage    takes Ditropan daily   Vitamin D deficiency    takes Vit D daily   Past Surgical History:  Procedure Laterality Date   APPENDECTOMY     incidental at time of C-section   BACK SURGERY  1986   cataract surgery Bilateral    CESAREAN SECTION  1979   CHOLECYSTECTOMY  1982   COLONOSCOPY  2004   Dr. Frankey Poot polyps, left sided diverticula. Path unavailable.    COLONOSCOPY  02/12/2011   Procedure: COLONOSCOPY;  Surgeon: Corbin Ade, MD;  Location: [facility];  Service: Endoscopy;  Laterality: N/A;   ESOPHAGOGASTRODUODENOSCOPY  02/12/2011   Procedure: ESOPHAGOGASTRODUODENOSCOPY (EGD);  Surgeon: Corbin Ade, MD;  Location:  [facility];  Service: Endoscopy;  Laterality: N/A;   GASTRIC ROUX-EN-Y N/A 06/19/2015   Procedure: LAPAROSCOPIC ROUX-EN-Y GASTRIC BYPASS WITH UPPER ENDOSCOPY;  Surgeon: Glenna Fellows, MD;  Location: [facility];  Service: General;  Laterality: N/A;   KNEE SURGERY  1999   left   TOTAL KNEE ARTHROPLASTY  10/2009   left   TOTAL KNEE ARTHROPLASTY Right 11/25/2016   Procedure: RIGHT TOTAL KNEE ARTHROPLASTY;  Surgeon: Kathryne Hitch, MD;  Location: [facility];  Service: Orthopedics;  Laterality: Right;   UPPER GI ENDOSCOPY  06/19/2015   Procedure: UPPER GI ENDOSCOPY;  Surgeon: Glenna Fellows, MD;  Location: [facility];  Service: General;;   Patient Active Problem List   Diagnosis Date Noted   Thyroid dysfunction 02/25/2019   Overactive bladder 02/25/2019   Lichen sclerosus of female genitalia 02/25/2019   Status post total right knee replacement 11/25/2016   Unilateral primary osteoarthritis, right knee 10/22/2016   Trigger thumb, left thumb 10/22/2016   Morbid obesity (HCC) 06/19/2015   GERD (gastroesophageal reflux disease) 01/20/2011   Colon polyp 01/20/2011   Hemorrhoids 01/20/2011    PCP:  Nita Sells, MD REFERRING PROVIDER: Newman Pies, MD  REFERRING DIAG: dizziness  THERAPY DIAG:  Dizziness and giddiness  Abnormality of gait due to impairment of balance  ONSET DATE: about a year ago  Rationale for Evaluation and Treatment: Rehabilitation   SUBJECTIVE STATEMENT: no complaints of dizziness; exercises for her neck seem to be helping; able to participate in senior games without issue  PERTINENT HISTORY:  Patient reports left foot drop Takes Tai chi classes at senior center Has had both knees replaced  PAIN:  Are you having pain? Yes: NPRS scale: 3/10 Pain location: left cervical area.  Pain description: aching Aggravating factors: rotating her head.  Relieving factors: rest   PRECAUTIONS: Fall  WEIGHT BEARING RESTRICTIONS: No  FALLS: Has patient fallen in last 6  months? Yes. Number of falls 1  LIVING ENVIRONMENT: Lives with: lives alone Lives in: House/apartment Stairs: No Has following equipment at home: Single point cane, Environmental consultant - 2 wheeled, Wheelchair (manual), bed side commode, Grab bars, and Ramped entry  PLOF: Independent  PATIENT GOALS: "have better balance so I won't fall"  OBJECTIVE:   DIAGNOSTIC FINDINGS: none  COGNITION: Overall cognitive status: Within functional limits for tasks assessed   POSTURE:  rounded shoulders, forward head, and increased thoracic kyphosis  Cervical ROM:  grossly wfl throughout  Active AROM (deg) eval  Flexion   Extension   Right lateral flexion   Left lateral flexion   Right rotation   Left rotation   (Blank rows = not tested)  STRENGTH: not tested at eval  LOWER EXTREMITY MMT:   MMT Right eval Left eval  Hip flexion 4+ 4  Hip abduction    Hip extension 4 4  Hip adduction    Hip internal rotation    Hip external rotation    Knee flexion 4+ 4+  Knee extension 5 5  Ankle dorsiflexion 5 5  Ankle plantarflexion    Ankle inversion    Ankle eversion    (Blank rows = not tested)  BED MOBILITY:  Not tested  TRANSFERS: Assistive device utilized: None  Sit to stand: Modified independence Stand to sit: Modified independence Chair to chair: Modified independence Floor:  not tested    FUNCTIONAL TESTS:  5 times sit to stand: 35.46 sec using hands to push up to stand Dynamic Gait Index: 11/24 SLS 29 sec on the left; 15 sec on the right  PATIENT SURVEYS:  DHI 18/100 mild handicap  VESTIBULAR ASSESSMENT:  GENERAL OBSERVATION: wears reading glasses only   SYMPTOM BEHAVIOR:  Subjective history: see above  Non-Vestibular symptoms:  none  Type of dizziness: Imbalance (Disequilibrium) and Unsteady with head/body turns  Frequency: occassionally   Duration: short periods of time  Aggravating factors: Induced by motion: activity in general and standing and walking and closing  eyes  Relieving factors: slow movements  Progression of symptoms: unchanged  OCULOMOTOR EXAM:  Ocular Alignment: normal  Ocular ROM: No Limitations  Spontaneous Nystagmus: absent  Gaze-Induced Nystagmus: absent  Smooth Pursuits: intact  Saccades: intact  Convergence/Divergence: not tested cm     VESTIBULAR - OCULAR REFLEX:   Slow VOR: Positive Bilaterally  VOR Cancellation: Comment: not tested  Head-Impulse Test: HIT Right: positive HIT Left: positive  Dynamic Visual Acuity:  not tested   POSITIONAL TESTING: Other: not tested on eval  MOTION SENSITIVITY:  Motion Sensitivity Quotient Intensity: 0 = none, 1 = Lightheaded, 2 = Mild, 3 = Moderate, 4 = Severe, 5 = Vomiting  Intensity  1. Sitting to  supine 0  2. Supine to L side   3. Supine to R side   4. Supine to sitting 2  5. L Hallpike-Dix   6. Up from L    7. R Hallpike-Dix 2  8. Up from R    9. Sitting, head tipped to L knee   10. Head up from L knee   11. Sitting, head tipped to R knee   12. Head up from R knee   13. Sitting head turns x5 0  14.Sitting head nods x5 0  15. In stance, 180 turn to L    16. In stance, 180 turn to R     FUNCTIONAL GAIT: DGI 1. Gait level surface (2) Mild Impairment: Walks 20', uses assistive devices, slower speed, mild gait deviations. 2. Change in gait speed (2) Mild Impairment: Is able to change speed but demonstrates mild gait deviations, or not gait deviations but unable to achieve a significant change in velocity, or uses an assistive device. 3. Gait with horizontal head turns (1) Moderate Impairment: Performs head turns with moderate change in gait velocity, slows down, staggers but recovers, can continue to walk. 4. Gait with vertical head turns 1) Moderate Impairment: Performs head turns with moderate change in gait velocity, slows down, staggers but recovers, can continue to walk. 5. Gait and pivot turn (1) Moderate Impairment: Turns slowly, requires verbal cueing,  requires several small steps to catch balance following turn and stop. 6. Step over obstacle (1) Moderate Impairment: Is able to step over box but must stop, then step over. May require verbal cueing. 7. Step around obstacles (2) Mild Impairment: Is able to step around both cones, but must slow down and adjust steps to clear cones. 8. Stairs (1) Moderate Impairment: Two feet to a stair, must use rail.  TOTAL SCORE: 11 / 24    VESTIBULAR TREATMENT:                                                                                                   DATE:  12/18/22 Sitting: Chin tucks 5" x 10 Scapular retractions 5" x 10 UT stretch 3 x 20" Shoulder rolls (up, back, down) x 10  UBE backwards x 3'  Standing: Scapular retractions GTB 2 x 10 Shoulder extensions GTB 2 x 10 Tandem stance 2 x 30" each Tandem stance with head turns x 10 each Lunge on 8" step alternating 2 x 10 each NBOS on foam with head nods and turns x 10 each    12/11/22 Chin tuck 10 x 5"  Scap retraction 10 x 5"  UT stretch 3 x 20" Cervical sidebend iso 10 x 5" each  Band rows RTB 2 x 10 Shoulder extension RTB 2 x 10  Tandem stance 2 x 30" NBOS with head turns x 10 each   12/03/2022 Supine: manual traction x 10  Cervical retraction x 10 Scapular retraction x 10 Cervical rotation x 10  Isometric for sidebend as well as extension x 10  Sitting: Tall posture x 5  Scapular retraction x 10 Cervical retraction x 10 ] SB  x 5  Standing : Semi tandem stance 3 x for 10" each Marching x 10  NBS x 5 with head turns    11/25/22 Review of HEP and goals Seated: Gaze stabilization: Head nods and turns x 10 each with eyes on target  Standing: SLS: x 30" each  MMT's of lower extremities  Supine: Cervical retractions 3" hold x 5 Scapular retractions 3" hold x 5  Dix hall pike to right; no nystagmus but reports of mild dizziness so performed epley at end of session    11/21/22 physical therapy evaluation and  HEP instruction  Canalith Repositioning:  Comment: not tested Gaze Adaptation:   Habituation:  Seated Vertical Head Movements: number of reps: 10 and Seated Horizontal Head Movements: number of reps: 10 Other:   PATIENT EDUCATION: Education details: Patient educated on exam findings, POC, scope of PT, HEP, and what to expect next visit. Person educated: Patient Education method: Explanation, Demonstration, and Handouts Education comprehension: verbalized understanding, returned demonstration, verbal cues required, and tactile cues required   HOME EXERCISE PROGRAM: 12/03/22 - Supine Cervical Rotation AROM on Pillow  - 2 x daily - 7 x weekly - 3 sets - 10 reps - 3" hold - Supine Isometric Neck Sidebend  - 2 x daily - 7 x weekly - 3 sets - 10 reps - 3" hold - Supine Isometric Neck Extension  - 2 x daily - 7 x weekly - 3 sets - 10 reps - 3" hold - Tandem Stance  - 2 x daily - 7 x weekly - 1 sets - 10 reps - 10" hold - Standing Marching  - 2 x daily - 7 x weekly - 1 sets - 10 reps - 5" hold 11/25/22 scapular retractions, cervical retractions  Access Code: [email or web address] URL: [email or web address] 12/[email or web address] - Standing Shoulder Row with Anchored Resistance  - 1-2 x daily - 7 x weekly - 2 sets - 10 reps - Shoulder extension with resistance - Neutral  - 1-2 x daily - 7 x weekly - 2 sets - 10 reps - Seated Upper Trapezius Stretch  - 2 x daily - 7 x weekly - 1 sets - 3 reps - 30 second hold  Date: 11/21/2022 Prepared by: [facility]  Exercises - Standing Single Leg Stance with Counter Support  - 2 x daily - 7 x weekly - 1 sets - 3 reps - Seated Gaze Stabilization with Head Rotation  - 2 x daily - 7 x weekly - 1 sets - 10 reps - Seated Gaze Stabilization with Head Nod  - 2 x daily - 7 x weekly - 1 sets - 10 reps GOALS: Goals reviewed with patient? No  SHORT TERM GOALS: Target date: 12/12/2022 patient will be independent with initial HEP  Baseline: Goal status: IN PROGRESS  2.   Patient will self report 30% improvement to improve tolerance for functional activity  Baseline:  Goal status: IN PROGRESS   LONG TERM GOALS: Target date: 12/26/2022  Patient will be independent in self management strategies to improve quality of life and functional outcomes.  Baseline:  Goal status: IN PROGRESS  2.  Patient will self report 50% improvement to improve tolerance for functional activity  Baseline:  Goal status: IN PROGRESS  3.  Patient will improve score on DHI to 12 or less to demonstrate improved functional mobility  Baseline: 18 Goal status: IN PROGRESS  4.  Patient will improve DGI score by 4 points (15) to improve dynamic gait and balance with  functional activity Baseline: 11/24 Goal status: IN PROGRESS  5.  Patient will improve 5 times sit to stand score from 35.46 sec to 30 sec or less to demonstrate improved functional mobility and increased lower extremity strength Baseline:  Goal status: IN PROGRESS  ASSESSMENT:  CLINICAL IMPRESSION: No dizziness today so focused on postural strengthening; balance today. Good challenge with tandem stance with head turns and small bos with head turns on foam.   Patient will continue to benefit from skilled therapy services to reduce remaining deficits and improve functional ability.   OBJECTIVE IMPAIRMENTS: Abnormal gait, decreased activity tolerance, decreased balance, decreased coordination, decreased mobility, difficulty walking, decreased ROM, decreased strength, impaired perceived functional ability, and pain.   ACTIVITY LIMITATIONS: carrying, lifting, bending, standing, squatting, stairs, and locomotion level  PARTICIPATION LIMITATIONS: meal prep, cleaning, community activity, and occupation  REHAB POTENTIAL: Good  CLINICAL DECISION MAKING: Evolving/moderate complexity   EVALUATION COMPLEXITY: Moderate   PLAN:  PT FREQUENCY: 1x/week  PT DURATION: 6 weeks  PLANNED INTERVENTIONS: Therapeutic exercises,  Therapeutic activity, Neuromuscular re-education, Balance training, Gait training, Patient/Family education, Joint manipulation, Joint mobilization, Stair training, Orthotic/Fit training, DME instructions, Aquatic Therapy, Dry Needling, Electrical stimulation, Spinal manipulation, Spinal mobilization, Cryotherapy, Moist heat, Compression bandaging, scar mobilization, Splintting, Taping, Traction, Ultrasound, Ionotophoresis 4mg /ml Dexamethasone, and Manual therapy   PLAN FOR NEXT SESSION:balance, postural and cervical stability exercises. Reassess next visit   11:58 AM, 12/18/22 Hosey Burmester Small Jerod Mcquain MPT [facility] [place] [phone]

## 2022-12-23 ENCOUNTER — Ambulatory Visit (HOSPITAL_COMMUNITY): Payer: Medicare Other

## 2022-12-23 DIAGNOSIS — R2689 Other abnormalities of gait and mobility: Secondary | ICD-10-CM | POA: Diagnosis not present

## 2022-12-23 DIAGNOSIS — R42 Dizziness and giddiness: Secondary | ICD-10-CM

## 2022-12-23 NOTE — Therapy (Signed)
OUTPATIENT PHYSICAL THERAPY VESTIBULAR TREATMENT/PROGRESS NOTE Progress Note Reporting Period 11/21/2022 to 12/23/2022  See note below for Objective Data and Assessment of Progress/Goals.         Patient Name: [name] MRN: [id number] DOB:Sep 29, 1940, 82 y.o., female Today's Date: 12/23/2022   PT End of Session - 12/23/22 1112     Visit Number 6    Number of Visits 6    Date for PT Re-Evaluation 12/26/22    Authorization Type Medicare part A and [employer]    Progress Note Due on Visit 10    PT Start Time 1112    PT Stop Time 1140    PT Time Calculation (min) 28 min    Activity Tolerance Patient tolerated treatment well    Behavior During Therapy WFL for tasks assessed/performed                Past Medical History:  Diagnosis Date   Allergy    takes Claritin daily   Arthritis    Complication of anesthesia    Constipation    takes Senokot daily   Depression    takes Lexapro daily   Family history of adverse reaction to anesthesia    sister has a hard time waking up   GERD (gastroesophageal reflux disease)    History of blood clots 1979   after C/S clot went to lung   History of bronchitis    3 yrs ago   History of migraine    Hypothyroidism    takes Synthroid daily   Joint pain    Joint swelling    Personal history of colonic polyps    benign   PONV (postoperative nausea and vomiting)    Rheumatic fever 1958   hospitalized for three months   Urinary leakage    takes Ditropan daily   Vitamin D deficiency    takes Vit D daily   Past Surgical History:  Procedure Laterality Date   APPENDECTOMY     incidental at time of C-section   BACK SURGERY  1986   cataract surgery Bilateral    CESAREAN SECTION  1979   CHOLECYSTECTOMY  1982   COLONOSCOPY  2004   Dr. Frankey Poot polyps, left sided diverticula. Path unavailable.    COLONOSCOPY  02/12/2011   Procedure: COLONOSCOPY;  Surgeon: Corbin Ade, MD;  Location: [facility];  Service: Endoscopy;   Laterality: N/A;   ESOPHAGOGASTRODUODENOSCOPY  02/12/2011   Procedure: ESOPHAGOGASTRODUODENOSCOPY (EGD);  Surgeon: Corbin Ade, MD;  Location: [facility];  Service: Endoscopy;  Laterality: N/A;   GASTRIC ROUX-EN-Y N/A 06/19/2015   Procedure: LAPAROSCOPIC ROUX-EN-Y GASTRIC BYPASS WITH UPPER ENDOSCOPY;  Surgeon: Glenna Fellows, MD;  Location: [facility];  Service: General;  Laterality: N/A;   KNEE SURGERY  1999   left   TOTAL KNEE ARTHROPLASTY  10/2009   left   TOTAL KNEE ARTHROPLASTY Right 11/25/2016   Procedure: RIGHT TOTAL KNEE ARTHROPLASTY;  Surgeon: Kathryne Hitch, MD;  Location: [facility];  Service: Orthopedics;  Laterality: Right;   UPPER GI ENDOSCOPY  06/19/2015   Procedure: UPPER GI ENDOSCOPY;  Surgeon: Glenna Fellows, MD;  Location: [facility];  Service: General;;   Patient Active Problem List   Diagnosis Date Noted   Thyroid dysfunction 02/25/2019   Overactive bladder 02/25/2019   Lichen sclerosus of female genitalia 02/25/2019   Status post total right knee replacement 11/25/2016   Unilateral primary osteoarthritis, right knee 10/22/2016   Trigger thumb, left thumb 10/22/2016   Morbid  obesity (HCC) 06/19/2015   GERD (gastroesophageal reflux disease) 01/20/2011   Colon polyp 01/20/2011   Hemorrhoids 01/20/2011    PCP: Nita Sells, MD REFERRING PROVIDER: Newman Pies, MD  REFERRING DIAG: dizziness  THERAPY DIAG:  Dizziness and giddiness  Abnormality of gait due to impairment of balance  ONSET DATE: about a year ago  Rationale for Evaluation and Treatment: Rehabilitation   SUBJECTIVE STATEMENT: "85% to 90%" better; "the only time I get dizzy is if I close my eyes in the shower"  PERTINENT HISTORY:  Patient reports left foot drop Takes Tai chi classes at senior center Has had both knees replaced  PAIN:  Are you having pain? Yes: NPRS scale: 3/10 Pain location: left cervical area.  Pain description: aching Aggravating factors: rotating her head.  Relieving  factors: rest   PRECAUTIONS: Fall  WEIGHT BEARING RESTRICTIONS: No  FALLS: Has patient fallen in last 6 months? Yes. Number of falls 1  LIVING ENVIRONMENT: Lives with: lives alone Lives in: House/apartment Stairs: No Has following equipment at home: Single point cane, Environmental consultant - 2 wheeled, Wheelchair (manual), bed side commode, Grab bars, and Ramped entry  PLOF: Independent  PATIENT GOALS: "have better balance so I won't fall"  OBJECTIVE:   DIAGNOSTIC FINDINGS: none  COGNITION: Overall cognitive status: Within functional limits for tasks assessed   POSTURE:  rounded shoulders, forward head, and increased thoracic kyphosis  Cervical ROM:  grossly wfl throughout  Active AROM (deg) eval  Flexion   Extension   Right lateral flexion   Left lateral flexion   Right rotation   Left rotation   (Blank rows = not tested)  STRENGTH: not tested at eval  LOWER EXTREMITY MMT:   MMT Right eval Left eval  Hip flexion 4+ 4  Hip abduction    Hip extension 4 4  Hip adduction    Hip internal rotation    Hip external rotation    Knee flexion 4+ 4+  Knee extension 5 5  Ankle dorsiflexion 5 5  Ankle plantarflexion    Ankle inversion    Ankle eversion    (Blank rows = not tested)  BED MOBILITY:  Not tested  TRANSFERS: Assistive device utilized: None  Sit to stand: Modified independence Stand to sit: Modified independence Chair to chair: Modified independence Floor:  not tested    FUNCTIONAL TESTS:  5 times sit to stand: 35.46 sec using hands to push up to stand Dynamic Gait Index: 11/24 SLS 29 sec on the left; 15 sec on the right  PATIENT SURVEYS:  DHI 18/100 mild handicap  VESTIBULAR ASSESSMENT:  GENERAL OBSERVATION: wears reading glasses only   SYMPTOM BEHAVIOR:  Subjective history: see above  Non-Vestibular symptoms:  none  Type of dizziness: Imbalance (Disequilibrium) and Unsteady with head/body turns  Frequency: occassionally   Duration: short  periods of time  Aggravating factors: Induced by motion: activity in general and standing and walking and closing eyes  Relieving factors: slow movements  Progression of symptoms: unchanged  OCULOMOTOR EXAM:  Ocular Alignment: normal  Ocular ROM: No Limitations  Spontaneous Nystagmus: absent  Gaze-Induced Nystagmus: absent  Smooth Pursuits: intact  Saccades: intact  Convergence/Divergence: not tested cm     VESTIBULAR - OCULAR REFLEX:   Slow VOR: Positive Bilaterally  VOR Cancellation: Comment: not tested  Head-Impulse Test: HIT Right: positive HIT Left: positive  Dynamic Visual Acuity:  not tested   POSITIONAL TESTING: Other: not tested on eval  MOTION SENSITIVITY:  Motion Sensitivity Quotient Intensity: 0 =  none, 1 = Lightheaded, 2 = Mild, 3 = Moderate, 4 = Severe, 5 = Vomiting  Intensity  1. Sitting to supine 0  2. Supine to L side   3. Supine to R side   4. Supine to sitting 2  5. L Hallpike-Dix   6. Up from L    7. R Hallpike-Dix 2  8. Up from R    9. Sitting, head tipped to L knee   10. Head up from L knee   11. Sitting, head tipped to R knee   12. Head up from R knee   13. Sitting head turns x5 0  14.Sitting head nods x5 0  15. In stance, 180 turn to L    16. In stance, 180 turn to R     FUNCTIONAL GAIT: DGI 1. Gait level surface (2) Mild Impairment: Walks 20', uses assistive devices, slower speed, mild gait deviations. 2. Change in gait speed (2) Mild Impairment: Is able to change speed but demonstrates mild gait deviations, or not gait deviations but unable to achieve a significant change in velocity, or uses an assistive device. 3. Gait with horizontal head turns (1) Moderate Impairment: Performs head turns with moderate change in gait velocity, slows down, staggers but recovers, can continue to walk. 4. Gait with vertical head turns 1) Moderate Impairment: Performs head turns with moderate change in gait velocity, slows down, staggers but recovers,  can continue to walk. 5. Gait and pivot turn (1) Moderate Impairment: Turns slowly, requires verbal cueing, requires several small steps to catch balance following turn and stop. 6. Step over obstacle (1) Moderate Impairment: Is able to step over box but must stop, then step over. May require verbal cueing. 7. Step around obstacles (2) Mild Impairment: Is able to step around both cones, but must slow down and adjust steps to clear cones. 8. Stairs (1) Moderate Impairment: Two feet to a stair, must use rail.  TOTAL SCORE: 11 / 24    VESTIBULAR TREATMENT:                                                                                                   DATE:  12/23/22 Progress note DHI 2 5 times sit to stand  24.31 sec Review of HEP  DGI 1. Gait level surface (3) Normal: Walks 20', no assistive devices, good sped, no evidence for imbalance, normal gait pattern 2. Change in gait speed (3) Normal: Able to smoothly change walking speed without loss of balance or gait deviation. Shows a significant difference in walking speeds between normal, fast and slow speeds. 3. Gait with horizontal head turns (2) Mild Impairment: Performs head turns smoothly with slight change in gait velocity, i.e., minor disruption to smooth gait path or uses walking aid. 4. Gait with vertical head turns (2) Mild Impairment: Performs head turns smoothly with slight change in gait velocity, i.e., minor disruption to smooth gait path or uses walking aid. 5. Gait and pivot turn (3) Normal: Pivot turns safely within 3 seconds and stops quickly with no loss of balance. 6. Step over obstacle (2)  Mild Impairment: Is able to step over box, but must slow down and adjust steps to clear box safely. 7. Step around obstacles (3) Normal: Is able to walk around cones safely without changing gait speed; no evidence of imbalance. 8. Stairs (2) Mild Impairment: Alternating feet, must use rail.  TOTAL SCORE: 20 /  24    12/18/22 Sitting: Chin tucks 5" x 10 Scapular retractions 5" x 10 UT stretch 3 x 20" Shoulder rolls (up, back, down) x 10  UBE backwards x 3'  Standing: Scapular retractions GTB 2 x 10 Shoulder extensions GTB 2 x 10 Tandem stance 2 x 30" each Tandem stance with head turns x 10 each Lunge on 8" step alternating 2 x 10 each NBOS on foam with head nods and turns x 10 each    12/11/22 Chin tuck 10 x 5"  Scap retraction 10 x 5"  UT stretch 3 x 20" Cervical sidebend iso 10 x 5" each  Band rows RTB 2 x 10 Shoulder extension RTB 2 x 10  Tandem stance 2 x 30" NBOS with head turns x 10 each   12/03/2022 Supine: manual traction x 10  Cervical retraction x 10 Scapular retraction x 10 Cervical rotation x 10  Isometric for sidebend as well as extension x 10  Sitting: Tall posture x 5  Scapular retraction x 10 Cervical retraction x 10 ] SB x 5  Standing : Semi tandem stance 3 x for 10" each Marching x 10  NBS x 5 with head turns    11/25/22 Review of HEP and goals Seated: Gaze stabilization: Head nods and turns x 10 each with eyes on target  Standing: SLS: x 30" each  MMT's of lower extremities  Supine: Cervical retractions 3" hold x 5 Scapular retractions 3" hold x 5  Dix hall pike to right; no nystagmus but reports of mild dizziness so performed epley at end of session    11/21/22 physical therapy evaluation and HEP instruction  Canalith Repositioning:  Comment: not tested Gaze Adaptation:   Habituation:  Seated Vertical Head Movements: number of reps: 10 and Seated Horizontal Head Movements: number of reps: 10 Other:   PATIENT EDUCATION: Education details: Patient educated on exam findings, POC, scope of PT, HEP, and what to expect next visit. Person educated: Patient Education method: Explanation, Demonstration, and Handouts Education comprehension: verbalized understanding, returned demonstration, verbal cues required, and tactile cues  required   HOME EXERCISE PROGRAM: 12/03/22 - Supine Cervical Rotation AROM on Pillow  - 2 x daily - 7 x weekly - 3 sets - 10 reps - 3" hold - Supine Isometric Neck Sidebend  - 2 x daily - 7 x weekly - 3 sets - 10 reps - 3" hold - Supine Isometric Neck Extension  - 2 x daily - 7 x weekly - 3 sets - 10 reps - 3" hold - Tandem Stance  - 2 x daily - 7 x weekly - 1 sets - 10 reps - 10" hold - Standing Marching  - 2 x daily - 7 x weekly - 1 sets - 10 reps - 5" hold 11/25/22 scapular retractions, cervical retractions  Access Code: [email or web address] URL: [email or web address] 12/[email or web address] - Standing Shoulder Row with Anchored Resistance  - 1-2 x daily - 7 x weekly - 2 sets - 10 reps - Shoulder extension with resistance - Neutral  - 1-2 x daily - 7 x weekly - 2 sets - 10 reps - Seated Upper Trapezius Stretch  - 2 x  daily - 7 x weekly - 1 sets - 3 reps - 30 second hold  Date: 11/21/2022 Prepared by: [facility]  Exercises - Standing Single Leg Stance with Counter Support  - 2 x daily - 7 x weekly - 1 sets - 3 reps - Seated Gaze Stabilization with Head Rotation  - 2 x daily - 7 x weekly - 1 sets - 10 reps - Seated Gaze Stabilization with Head Nod  - 2 x daily - 7 x weekly - 1 sets - 10 reps GOALS: Goals reviewed with patient? No  SHORT TERM GOALS: Target date: 12/12/2022 patient will be independent with initial HEP  Baseline: Goal status: MET  2.  Patient will self report 30% improvement to improve tolerance for functional activity  Baseline:  Goal status: MET   LONG TERM GOALS: Target date: 12/26/2022  Patient will be independent in self management strategies to improve quality of life and functional outcomes.  Baseline:  Goal status: MET  2.  Patient will self report 50% improvement to improve tolerance for functional activity  Baseline:  Goal status: MET  3.  Patient will improve score on DHI to 12 or less to demonstrate improved functional mobility  Baseline: 18; 2  12/23/22 Goal status: MET  4.  Patient will improve DGI score by 4 points (15) to improve dynamic gait and balance with functional activity Baseline: 11/24; 20/24 Goal status: MET  5.  Patient will improve 5 times sit to stand score from 35.46 sec to 30 sec or less to demonstrate improved functional mobility and increased lower extremity strength Baseline: 24.31 sec Goal status: MET  ASSESSMENT:  CLINICAL IMPRESSION: Progress note today.  Patient has met all set rehab goals and is agreeable to discharge at this time.      OBJECTIVE IMPAIRMENTS: Abnormal gait, decreased activity tolerance, decreased balance, decreased coordination, decreased mobility, difficulty walking, decreased ROM, decreased strength, impaired perceived functional ability, and pain.   ACTIVITY LIMITATIONS: carrying, lifting, bending, standing, squatting, stairs, and locomotion level  PARTICIPATION LIMITATIONS: meal prep, cleaning, community activity, and occupation  REHAB POTENTIAL: Good  CLINICAL DECISION MAKING: Evolving/moderate complexity   EVALUATION COMPLEXITY: Moderate   PLAN:  PT FREQUENCY: 1x/week  PT DURATION: 6 weeks  PLANNED INTERVENTIONS: Therapeutic exercises, Therapeutic activity, Neuromuscular re-education, Balance training, Gait training, Patient/Family education, Joint manipulation, Joint mobilization, Stair training, Orthotic/Fit training, DME instructions, Aquatic Therapy, Dry Needling, Electrical stimulation, Spinal manipulation, Spinal mobilization, Cryotherapy, Moist heat, Compression bandaging, scar mobilization, Splintting, Taping, Traction, Ultrasound, Ionotophoresis 4mg /ml Dexamethasone, and Manual therapy   PLAN FOR NEXT SESSION:discharge   11:40 AM, 12/23/22 Alieyah Spader Small Ladavia Lindenbaum MPT  physical therapy [place] [phone] Ph:[phone]

## 2023-02-19 DIAGNOSIS — Z713 Dietary counseling and surveillance: Secondary | ICD-10-CM | POA: Diagnosis not present

## 2023-02-19 DIAGNOSIS — E039 Hypothyroidism, unspecified: Secondary | ICD-10-CM | POA: Diagnosis not present

## 2023-02-19 DIAGNOSIS — R5383 Other fatigue: Secondary | ICD-10-CM | POA: Diagnosis not present

## 2023-02-19 DIAGNOSIS — E669 Obesity, unspecified: Secondary | ICD-10-CM | POA: Diagnosis not present

## 2023-02-19 DIAGNOSIS — Z79890 Hormone replacement therapy (postmenopausal): Secondary | ICD-10-CM | POA: Diagnosis not present

## 2023-02-19 DIAGNOSIS — I10 Essential (primary) hypertension: Secondary | ICD-10-CM | POA: Diagnosis not present

## 2023-03-05 DIAGNOSIS — Z6834 Body mass index (BMI) 34.0-34.9, adult: Secondary | ICD-10-CM | POA: Diagnosis not present

## 2023-03-05 DIAGNOSIS — R030 Elevated blood-pressure reading, without diagnosis of hypertension: Secondary | ICD-10-CM | POA: Diagnosis not present

## 2023-03-05 DIAGNOSIS — Z713 Dietary counseling and surveillance: Secondary | ICD-10-CM | POA: Diagnosis not present

## 2023-03-19 DIAGNOSIS — N3942 Incontinence without sensory awareness: Secondary | ICD-10-CM | POA: Diagnosis not present

## 2023-03-19 DIAGNOSIS — R350 Frequency of micturition: Secondary | ICD-10-CM | POA: Diagnosis not present

## 2023-04-26 ENCOUNTER — Encounter (HOSPITAL_COMMUNITY): Payer: Self-pay

## 2023-04-26 ENCOUNTER — Emergency Department (HOSPITAL_COMMUNITY)
Admission: EM | Admit: 2023-04-26 | Discharge: 2023-04-26 | Disposition: A | Discharge status: Home / Self Care | Payer: Medicare Other | Attending: Emergency Medicine | Admitting: Emergency Medicine

## 2023-04-26 ENCOUNTER — Other Ambulatory Visit: Payer: Self-pay

## 2023-04-26 ENCOUNTER — Emergency Department (HOSPITAL_COMMUNITY): Payer: Medicare Other

## 2023-04-26 DIAGNOSIS — S46911A Strain of unspecified muscle, fascia and tendon at shoulder and upper arm level, right arm, initial encounter: Secondary | ICD-10-CM | POA: Insufficient documentation

## 2023-04-26 DIAGNOSIS — W19XXXA Unspecified fall, initial encounter: Secondary | ICD-10-CM | POA: Diagnosis not present

## 2023-04-26 DIAGNOSIS — M19011 Primary osteoarthritis, right shoulder: Secondary | ICD-10-CM | POA: Diagnosis not present

## 2023-04-26 DIAGNOSIS — Z7982 Long term (current) use of aspirin: Secondary | ICD-10-CM | POA: Insufficient documentation

## 2023-04-26 DIAGNOSIS — Y92832 Beach as the place of occurrence of the external cause: Secondary | ICD-10-CM | POA: Diagnosis not present

## 2023-04-26 DIAGNOSIS — S43001A Unspecified subluxation of right shoulder joint, initial encounter: Secondary | ICD-10-CM | POA: Diagnosis not present

## 2023-04-26 DIAGNOSIS — S4991XA Unspecified injury of right shoulder and upper arm, initial encounter: Secondary | ICD-10-CM | POA: Diagnosis present

## 2023-04-26 MED ORDER — NAPROXEN 500 MG PO TABS: 500.0000 mg | ORAL_TABLET | Freq: Two times a day (BID) | ORAL | 0 refills | Status: DC

## 2023-04-26 NOTE — ED Provider Notes (Signed)
[place] EMERGENCY DEPARTMENT AT [facility] Provider Note   CSN: [id number] Arrival date & time: 04/26/23  1637     History  Chief Complaint  Patient presents with   [name] is a 82 y.o. female.   Fall  This patient is an 82 year old female, she was at the beach and approximately 4 days ago she had an accidental fall, she tripped over a cement parking stall landing on her arms and legs.  She took the primary brunt on her bilateral knees and hands but has had pain in the right shoulder since.  She has no numbness or weakness, she can move her elbow hand and wrist without difficulty but has pain when she tries to lift her arm to the side.  No significant head injury no loss of consciousness no neck pain, no abdominal pain chest pain or shortness of breath.     Home Medications Prior to Admission medications   Medication Sig Start Date End Date Taking? Authorizing Provider  naproxen (NAPROSYN) 500 MG tablet Take 1 tablet (500 mg total) by mouth 2 (two) times daily with a meal. 04/26/23  Yes Eber Hong, MD  acetaminophen (TYLENOL) 325 MG tablet Take 325 mg by mouth every 6 (six) hours as needed for mild pain or headache.    [provider]  albuterol (VENTOLIN HFA) 108 (90 Base) MCG/ACT inhaler Inhale 2 puffs into the lungs every 4 (four) hours as needed for wheezing or shortness of breath. 07/06/22   Particia Nearing, PA-C  Ascorbic Acid (VITAMIN C) 500 MG CHEW Chew 500 mg by mouth 2 (two) times daily.    [provider]  aspirin EC 325 MG EC tablet Take 1 tablet (325 mg total) by mouth 2 (two) times daily after a meal. 11/27/16   Kathryne Hitch, MD  betamethasone valerate (VALISONE) 0.1 % cream  02/23/18   [provider]  Calcium 600-400 MG-UNIT CHEW Chew 1 tablet by mouth 3 (three) times daily.    [provider]  Calcium Polycarbophil (FIBERCON PO) Take 1 tablet by mouth at bedtime.    [provider]  cephALEXin (KEFLEX) 500 MG capsule Take 1 capsule (500 mg total) by mouth 3 (three) times daily. 07/28/21   Charlynne Pander, MD  cholecalciferol (VITAMIN D) 1000 units tablet Take 1,000 Units by mouth daily.    [provider]  Cyanocobalamin (VITAMIN B-12) 2500 MCG SUBL Place 2,500 mcg under the tongue daily.     [provider]  escitalopram (LEXAPRO) 20 MG tablet Take 20 mg by mouth daily.      [provider]  fluticasone (FLONASE) 50 MCG/ACT nasal spray Place 1 spray into both nostrils 2 (two) times daily. 07/06/22   Particia Nearing, PA-C  Influenza vac split quadrivalent PF (FLUZONE HIGH-DOSE) 0.5 ML injection Fluzone High-Dose 2019-20 (PF) 180 mcg/0.5 mL intramuscular syringe  PHARMACIST ADMINISTERED IMMUNIZATION ADMINISTERED AT TIME OF DISPENSING    [provider]  levothyroxine (SYNTHROID) 125 MCG tablet levothyroxine 125 mcg tablet    [provider]  loratadine (CLARITIN) 10 MG tablet Take 10 mg by mouth daily.    [provider]  methocarbamol (ROBAXIN) 500 MG tablet Take 1 tablet (500 mg total) by mouth every 8 (eight) hours as needed for muscle spasms. 04/27/22   Benjiman Core, MD  Multiple Vitamin (MULTIVITAMIN WITH MINERALS) TABS tablet Take 1 tablet by mouth 2 (two) times daily.  [provider]  oxybutynin (DITROPAN) 5 MG tablet Take 5 mg by mouth every evening.  05/09/13   [provider]  oxybutynin (DITROPAN-XL) 10 MG 24 hr tablet oxybutynin chloride ER 10 mg tablet,extended release 24 hr    [provider]  oxyCODONE-acetaminophen (PERCOCET) 5-325 MG tablet Take 1 tablet by mouth every 6 (six) hours as needed. 07/28/21   Charlynne Pander, MD  pneumococcal 13-valent conjugate vaccine (PREVNAR 13) SUSP injection Prevnar 13 (PF) 0.5 mL intramuscular syringe  PHARMACIST ADMINISTERED IMMUNIZATION ADMINISTERED AT TIME OF DISPENSING    [provider]  predniSONE  (DELTASONE) 10 MG tablet prednisone 10 mg tablet    [provider]  promethazine-dextromethorphan (PROMETHAZINE-DM) 6.25-15 MG/5ML syrup Take 5 mLs by mouth 4 (four) times daily as needed. 07/06/22   Particia Nearing, PA-C  sennosides-docusate sodium (SENOKOT-S) 8.6-50 MG tablet Take 2 tablets by mouth at bedtime.     [provider]      Allergies    No known allergies    Review of Systems   Review of Systems  All other systems reviewed and are negative.   Physical Exam Updated Vital Signs BP (!) 185/110 (BP Location: Left Arm)   Pulse 79   Temp 98 F (36.7 C) (Oral)   Resp 19   Wt 90 kg   SpO2 95%   BMI 30.17 kg/m  Physical Exam Vitals and nursing note reviewed.  Constitutional:      General: She is not in acute distress.    Appearance: She is well-developed.  HENT:     Head: Normocephalic and atraumatic.     Mouth/Throat:     Pharynx: No oropharyngeal exudate.  Eyes:     General: No scleral icterus.       Right eye: No discharge.        Left eye: No discharge.     Conjunctiva/sclera: Conjunctivae normal.     Pupils: Pupils are equal, round, and reactive to light.  Neck:     Thyroid: No thyromegaly.     Vascular: No JVD.  Cardiovascular:     Rate and Rhythm: Normal rate and regular rhythm.     Heart sounds: Normal heart sounds. No murmur heard.    No friction rub. No gallop.  Pulmonary:     Effort: Pulmonary effort is normal. No respiratory distress.     Breath sounds: Normal breath sounds. No wheezing or rales.  Abdominal:     General: Bowel sounds are normal. There is no distension.     Palpations: Abdomen is soft. There is no mass.     Tenderness: There is no abdominal tenderness.  Musculoskeletal:        General: Tenderness present. No swelling or deformity. Normal range of motion.     Cervical back: Normal range of motion and neck supple.     Comments: The patient is able to move all 4 extremities with normal range of motion  except for the right shoulder where she has pain abducting as well as internal and external rotation, there is no obvious deformity of the shoulder, she is totally normal range of motion of the bilateral elbows wrist and hands.  Bilateral knees are normal, she ambulates without difficulty, there is mild bruising over the bilateral knees  Lymphadenopathy:     Cervical: No cervical adenopathy.  Skin:    General: Skin is warm and dry.     Findings: Bruising present. No erythema or rash.  Neurological:  Mental Status: She is alert.     Coordination: Coordination normal.  Psychiatric:        Behavior: Behavior normal.     ED Results / Procedures / Treatments   Labs (all labs ordered are listed, but only abnormal results are displayed) Labs Reviewed - No data to display  EKG None  Radiology DG Shoulder Right  Result Date: 04/26/2023 CLINICAL DATA:  Status post fall with right shoulder pain EXAM: RIGHT SHOULDER - 3 VIEW COMPARISON:  Right shoulder radiograph dated 02/24/2018 FINDINGS: There is no evidence of fracture. Inferior subluxation of the humeral head relative to the glenoid. Degenerative changes of the right shoulder. Soft tissues are unremarkable. IMPRESSION: Inferior subluxation of the humeral head relative to the glenoid. No evidence of fracture. If there is clinical concern for shoulder dislocation, further evaluation with axillary view is recommended. Electronically Signed   By: Agustin Cree M.D.   On: 04/26/2023 17:46    Procedures Procedures    Medications Ordered in ED Medications - No data to display  ED Course/ Medical Decision Making/ A&P                                 Medical Decision Making Amount and/or Complexity of Data Reviewed Radiology: ordered.  Risk Prescription drug management.   No signs of head injury, she has extremity injury which appears minor, right shoulder will need to be imaged to make sure there is no fractures or dislocations, I suspect  a rotator cuff or muscular injury.  She is incidentally hypertensive but of very little concern at this time given that she is in pain, she is not tachycardic and she has no signs of end organ dysfunction clinically.  She can have that followed up as an outpatient.  I personally viewed and interpreted the x-ray of the shoulder, possibly a slight subluxation but the patient has actually good range of motion passively and I doubt that this is a dislocation.  She can follow-up in the outpatient setting, she was informed of her results, offered a sling, anti-inflammatories, stable for discharge, she has seen Dr. Magnus Ivan in the past and is agreeable to follow-up in his office.        Final Clinical Impression(s) / ED Diagnoses Final diagnoses:  Shoulder strain, right, initial encounter    Rx / DC Orders ED Discharge Orders          Ordered    naproxen (NAPROSYN) 500 MG tablet  2 times daily with meals        04/26/23 1817              Eber Hong, MD 04/26/23 1818

## 2023-04-26 NOTE — Discharge Instructions (Signed)
Your x-ray shows that there is a slight loosening of your shoulder joint but there is nothing that is dislocated or fractured.  I would like for you to take Naprosyn twice a day as needed, use the shoulder sling for some relief of pain and follow-up with your orthopedic surgeon Dr. Magnus Ivan this coming week.  ER for severe or worsening symptoms

## 2023-04-26 NOTE — ED Triage Notes (Signed)
C/o right shoulder pain radiating into neck after mechanical  fall on Wednesday coming out of restaurant after tripping.

## 2023-04-27 ENCOUNTER — Ambulatory Visit (INDEPENDENT_AMBULATORY_CARE_PROVIDER_SITE_OTHER): Payer: Medicare Other | Admitting: Orthopaedic Surgery

## 2023-04-27 ENCOUNTER — Encounter: Payer: Self-pay | Admitting: Orthopaedic Surgery

## 2023-04-27 DIAGNOSIS — M25511 Pain in right shoulder: Secondary | ICD-10-CM

## 2023-04-27 NOTE — Progress Notes (Signed)
The patient is an 82 year old female working with [place] today due to an acute right shoulder injury.  She is actually a regular patient of ours.  She actually tripped in a parking lot yesterday and landed on her stomach.  She did feel like she injured her right shoulder.  She was seen at [facility] and they felt like she may have just had some slight subluxation of her right shoulder.  They placed in a sling but she is already remove that.  She says she is unable to lift her shoulder well but overall seems to be doing okay.  On exam clinically her shoulder is well located.  It is painful past 90 degrees of abduction but there is no blocks to abduction or rotation.  I did review the x-rays that was concerned about this a slight sublux position of the humeral head but no fracture.  Again from my standpoint clinically she certainly contused the bone and she could have a tear of the soft tissue but overall she looks pretty good for something that just happened yesterday.  She knows to use that arm carefully and to come out of the sling as comfort allows.  I would like to see her back in 2 weeks and that visit would like 2 views of the right shoulder with that being an AP view and an axillary view.

## 2023-05-08 DIAGNOSIS — E782 Mixed hyperlipidemia: Secondary | ICD-10-CM | POA: Diagnosis not present

## 2023-05-08 DIAGNOSIS — E039 Hypothyroidism, unspecified: Secondary | ICD-10-CM | POA: Diagnosis not present

## 2023-05-08 DIAGNOSIS — R809 Proteinuria, unspecified: Secondary | ICD-10-CM | POA: Diagnosis not present

## 2023-05-08 DIAGNOSIS — R7301 Impaired fasting glucose: Secondary | ICD-10-CM | POA: Diagnosis not present

## 2023-05-13 ENCOUNTER — Other Ambulatory Visit (INDEPENDENT_AMBULATORY_CARE_PROVIDER_SITE_OTHER): Payer: Medicare Other

## 2023-05-13 ENCOUNTER — Encounter: Payer: Self-pay | Admitting: Orthopaedic Surgery

## 2023-05-13 ENCOUNTER — Ambulatory Visit (INDEPENDENT_AMBULATORY_CARE_PROVIDER_SITE_OTHER): Payer: Medicare Other | Admitting: Orthopaedic Surgery

## 2023-05-13 DIAGNOSIS — M79672 Pain in left foot: Secondary | ICD-10-CM | POA: Diagnosis not present

## 2023-05-13 DIAGNOSIS — M25511 Pain in right shoulder: Secondary | ICD-10-CM

## 2023-05-13 NOTE — Progress Notes (Signed)
The patient is well-known to me.  She is 82 years old and she has had a series of mechanical falls.  I have talked in length in detail to her about needing a quad cane or a walker at this standpoint.  When she fell several weeks ago they were concerned about a subluxed right shoulder.  I wanted to see her back today for repeat x-rays.  She said the shoulder is feeling better but she did fall last week injuring her left foot.  Examination of her right shoulder shows clinically is well located and is moving much better.  She likely has some deficits of the rotator cuff.  3 views of the right shoulder show no evidence of fracture and the glenohumeral joint is well-maintained and well located.  Examination of her left foot does show some dorsal soft tissue swelling.  Her ankle feels stable on exam and I could not feel any deformities within her foot.  There is no instability of the midfoot.  3 views of her left foot show no obvious fracture.  I counseled again about using assistive ice for when she is ambulating.  Right now she would just give her shoulder and her foot time and if things worsen she knows to let us know.

## 2023-05-14 DIAGNOSIS — N3281 Overactive bladder: Secondary | ICD-10-CM | POA: Diagnosis not present

## 2023-05-14 DIAGNOSIS — R748 Abnormal levels of other serum enzymes: Secondary | ICD-10-CM | POA: Diagnosis not present

## 2023-05-14 DIAGNOSIS — R7401 Elevation of levels of liver transaminase levels: Secondary | ICD-10-CM | POA: Diagnosis not present

## 2023-05-14 DIAGNOSIS — R42 Dizziness and giddiness: Secondary | ICD-10-CM | POA: Diagnosis not present

## 2023-05-14 DIAGNOSIS — F331 Major depressive disorder, recurrent, moderate: Secondary | ICD-10-CM | POA: Diagnosis not present

## 2023-05-14 DIAGNOSIS — E039 Hypothyroidism, unspecified: Secondary | ICD-10-CM | POA: Diagnosis not present

## 2023-05-14 DIAGNOSIS — I10 Essential (primary) hypertension: Secondary | ICD-10-CM | POA: Diagnosis not present

## 2023-05-14 DIAGNOSIS — R7303 Prediabetes: Secondary | ICD-10-CM | POA: Diagnosis not present

## 2023-05-14 DIAGNOSIS — E782 Mixed hyperlipidemia: Secondary | ICD-10-CM | POA: Diagnosis not present

## 2023-05-14 DIAGNOSIS — R809 Proteinuria, unspecified: Secondary | ICD-10-CM | POA: Diagnosis not present

## 2023-05-14 DIAGNOSIS — Z23 Encounter for immunization: Secondary | ICD-10-CM | POA: Diagnosis not present

## 2023-05-14 DIAGNOSIS — E669 Obesity, unspecified: Secondary | ICD-10-CM | POA: Diagnosis not present

## 2023-05-25 ENCOUNTER — Telehealth: Payer: Self-pay | Admitting: Orthopaedic Surgery

## 2023-05-25 ENCOUNTER — Other Ambulatory Visit: Payer: Self-pay

## 2023-05-25 DIAGNOSIS — G8929 Other chronic pain: Secondary | ICD-10-CM

## 2023-05-25 NOTE — Telephone Encounter (Signed)
Patient called asked if she can get set up for an MRI? The number to contact patient is [phone]

## 2023-06-02 DIAGNOSIS — Z6833 Body mass index (BMI) 33.0-33.9, adult: Secondary | ICD-10-CM | POA: Diagnosis not present

## 2023-06-02 DIAGNOSIS — Z01419 Encounter for gynecological examination (general) (routine) without abnormal findings: Secondary | ICD-10-CM | POA: Diagnosis not present

## 2023-06-02 DIAGNOSIS — Z1231 Encounter for screening mammogram for malignant neoplasm of breast: Secondary | ICD-10-CM | POA: Diagnosis not present

## 2023-06-02 DIAGNOSIS — N952 Postmenopausal atrophic vaginitis: Secondary | ICD-10-CM | POA: Diagnosis not present

## 2023-06-03 DIAGNOSIS — R350 Frequency of micturition: Secondary | ICD-10-CM | POA: Diagnosis not present

## 2023-06-03 DIAGNOSIS — N3946 Mixed incontinence: Secondary | ICD-10-CM | POA: Diagnosis not present

## 2023-06-04 ENCOUNTER — Other Ambulatory Visit (HOSPITAL_COMMUNITY): Payer: Self-pay | Admitting: Family Medicine

## 2023-06-04 DIAGNOSIS — Z13820 Encounter for screening for osteoporosis: Secondary | ICD-10-CM

## 2023-06-04 DIAGNOSIS — M25511 Pain in right shoulder: Secondary | ICD-10-CM | POA: Diagnosis not present

## 2023-06-04 DIAGNOSIS — I10 Essential (primary) hypertension: Secondary | ICD-10-CM | POA: Diagnosis not present

## 2023-06-08 ENCOUNTER — Ambulatory Visit (HOSPITAL_COMMUNITY)
Admission: RE | Admit: 2023-06-08 | Discharge: 2023-06-08 | Disposition: A | Discharge status: Home / Self Care | Payer: Medicare Other | Source: Ambulatory Visit | Attending: Family Medicine | Admitting: Family Medicine

## 2023-06-08 DIAGNOSIS — Z780 Asymptomatic menopausal state: Secondary | ICD-10-CM | POA: Insufficient documentation

## 2023-06-08 DIAGNOSIS — Z13820 Encounter for screening for osteoporosis: Secondary | ICD-10-CM | POA: Diagnosis not present

## 2023-06-08 DIAGNOSIS — E559 Vitamin D deficiency, unspecified: Secondary | ICD-10-CM | POA: Insufficient documentation

## 2023-06-08 DIAGNOSIS — M810 Age-related osteoporosis without current pathological fracture: Secondary | ICD-10-CM | POA: Insufficient documentation

## 2023-06-13 ENCOUNTER — Inpatient Hospital Stay
Admission: RE | Admit: 2023-06-13 | Discharge: 2023-06-13 | Disposition: A | Discharge status: Home / Self Care | Payer: Medicare Other | Source: Ambulatory Visit | Attending: Orthopaedic Surgery | Admitting: Orthopaedic Surgery

## 2023-06-13 ENCOUNTER — Other Ambulatory Visit: Payer: Medicare Other

## 2023-06-13 DIAGNOSIS — M25411 Effusion, right shoulder: Secondary | ICD-10-CM | POA: Diagnosis not present

## 2023-06-13 DIAGNOSIS — G8929 Other chronic pain: Secondary | ICD-10-CM

## 2023-06-13 DIAGNOSIS — M7581 Other shoulder lesions, right shoulder: Secondary | ICD-10-CM | POA: Diagnosis not present

## 2023-06-13 DIAGNOSIS — M25511 Pain in right shoulder: Secondary | ICD-10-CM | POA: Diagnosis not present

## 2023-06-13 DIAGNOSIS — M19011 Primary osteoarthritis, right shoulder: Secondary | ICD-10-CM | POA: Diagnosis not present

## 2023-06-19 DIAGNOSIS — Z23 Encounter for immunization: Secondary | ICD-10-CM | POA: Diagnosis not present

## 2023-07-01 DIAGNOSIS — H43393 Other vitreous opacities, bilateral: Secondary | ICD-10-CM | POA: Diagnosis not present

## 2023-07-01 NOTE — Progress Notes (Signed)
CARDIOLOGY CONSULT NOTE       [name] MRN: [id number] DOB/AGE: 1940-12-02 82 y.o.  Referring Physician: Margo Aye Primary Physician: Benita Stabile, MD Primary Cardiologist: New Reason for Consultation: Murmur   HPI:  82 y.o. referred by Dr Margo Aye for cardiac murmur.  History of GERD, Depression, hypothyroidism, arthritis and rheumatic fever. Last TTE done 10/11/18 reviewed. EF 55-60% and only mild AR  She falls a lot mechanical. She still drives and travels a lot Has a little [place]ie named Lilly that is good company Had RF and took antibiotics through her 20's She has no chest pain, dyspnea , palpitations or syncope. She Has one brother near by Her only remaining son moved to [place] with her two grand kids   ROS All other systems reviewed and negative except as noted above  Past Medical History:  Diagnosis Date   Allergy    takes Claritin daily   Arthritis    Complication of anesthesia    Constipation    takes Senokot daily   Depression    takes Lexapro daily   Family history of adverse reaction to anesthesia    sister has a hard time waking up   GERD (gastroesophageal reflux disease)    History of blood clots 1979   after C/S clot went to lung   History of bronchitis    3 yrs ago   History of migraine    Hypothyroidism    takes Synthroid daily   Joint pain    Joint swelling    Personal history of colonic polyps    benign   PONV (postoperative nausea and vomiting)    Rheumatic fever 1958   hospitalized for three months   Urinary leakage    takes Ditropan daily   Vitamin D deficiency    takes Vit D daily    Family History  Problem Relation Age of Onset   Diabetes Other        siblings, mother   Lung disease Father        black lung, died young   Heart disease Brother    Colon cancer Neg Hx     Social History   Socioeconomic History   Marital status: Single    Spouse name: Not on file   Number of children: 1   Years of education: Not on file    Highest education level: Not on file  Occupational History    Employer: retired  Tobacco Use   Smoking status: Never   Smokeless tobacco: Never  Vaping Use   Vaping status: Never Used  Substance and Sexual Activity   Alcohol use: No   Drug use: No   Sexual activity: Not on file  Other Topics Concern   Not on file  Social History Narrative   Not on file   Social Determinants of Health   Financial Resource Strain: Not on file  Food Insecurity: Not on file  Transportation Needs: Not on file  Physical Activity: Not on file  Stress: Not on file  Social Connections: Not on file  Intimate Partner Violence: Not on file    Past Surgical History:  Procedure Laterality Date   APPENDECTOMY     incidental at time of C-section   BACK SURGERY  1986   cataract surgery Bilateral    CESAREAN SECTION  1979   CHOLECYSTECTOMY  1982   COLONOSCOPY  2004   Dr. Frankey Poot polyps, left sided diverticula. Path unavailable.    COLONOSCOPY  02/12/2011  Procedure: COLONOSCOPY;  Surgeon: Corbin Ade, MD;  Location: [facility];  Service: Endoscopy;  Laterality: N/A;   ESOPHAGOGASTRODUODENOSCOPY  02/12/2011   Procedure: ESOPHAGOGASTRODUODENOSCOPY (EGD);  Surgeon: Corbin Ade, MD;  Location: [facility];  Service: Endoscopy;  Laterality: N/A;   GASTRIC ROUX-EN-Y N/A 06/19/2015   Procedure: LAPAROSCOPIC ROUX-EN-Y GASTRIC BYPASS WITH UPPER ENDOSCOPY;  Surgeon: Glenna Fellows, MD;  Location: [facility];  Service: General;  Laterality: N/A;   KNEE SURGERY  1999   left   TOTAL KNEE ARTHROPLASTY  10/2009   left   TOTAL KNEE ARTHROPLASTY Right 11/25/2016   Procedure: RIGHT TOTAL KNEE ARTHROPLASTY;  Surgeon: Kathryne Hitch, MD;  Location: [facility];  Service: Orthopedics;  Laterality: Right;   UPPER GI ENDOSCOPY  06/19/2015   Procedure: UPPER GI ENDOSCOPY;  Surgeon: Glenna Fellows, MD;  Location: [facility];  Service: General;;      Current Outpatient Medications:    acetaminophen  (TYLENOL) 325 MG tablet, Take 325 mg by mouth every 6 (six) hours as needed for mild pain or headache., Disp: , Rfl:    Ascorbic Acid (VITAMIN C) 500 MG CHEW, Chew 500 mg by mouth 2 (two) times daily., Disp: , Rfl:    betamethasone valerate (VALISONE) 0.1 % cream, , Disp: , Rfl:    cephALEXin (KEFLEX) 500 MG capsule, Take 1 capsule (500 mg total) by mouth 3 (three) times daily., Disp: 21 capsule, Rfl: 0   cholecalciferol (VITAMIN D) 1000 units tablet, Take 1,000 Units by mouth daily., Disp: , Rfl:    Cyanocobalamin (VITAMIN B-12) 2500 MCG SUBL, Place 2,500 mcg under the tongue daily. , Disp: , Rfl:    escitalopram (LEXAPRO) 20 MG tablet, Take 20 mg by mouth daily.  , Disp: , Rfl:    fluticasone (FLONASE) 50 MCG/ACT nasal spray, Place 1 spray into both nostrils 2 (two) times daily., Disp: 16 g, Rfl: 2   Influenza vac split quadrivalent PF (FLUZONE HIGH-DOSE) 0.5 ML injection, Fluzone High-Dose 2019-20 (PF) 180 mcg/0.5 mL intramuscular syringe  PHARMACIST ADMINISTERED IMMUNIZATION ADMINISTERED AT TIME OF DISPENSING, Disp: , Rfl:    levothyroxine (SYNTHROID) 125 MCG tablet, levothyroxine 125 mcg tablet, Disp: , Rfl:    loratadine (CLARITIN) 10 MG tablet, Take 10 mg by mouth daily., Disp: , Rfl:    losartan (COZAAR) 50 MG tablet, Take 50 mg by mouth daily., Disp: , Rfl:    Multiple Vitamin (MULTIVITAMIN WITH MINERALS) TABS tablet, Take 1 tablet by mouth 2 (two) times daily. , Disp: , Rfl:    omeprazole (PRILOSEC OTC) 20 MG tablet, Take 20 mg by mouth daily., Disp: , Rfl:    albuterol (VENTOLIN HFA) 108 (90 Base) MCG/ACT inhaler, Inhale 2 puffs into the lungs every 4 (four) hours as needed for wheezing or shortness of breath. (Patient not taking: Reported on 07/15/2023), Disp: 18 g, Rfl: 0   aspirin EC 325 MG EC tablet, Take 1 tablet (325 mg total) by mouth 2 (two) times daily after a meal. (Patient not taking: Reported on 07/15/2023), Disp: 30 tablet, Rfl: 0   Calcium 600-400 MG-UNIT CHEW, Chew 1 tablet  by mouth 3 (three) times daily. (Patient not taking: Reported on 07/15/2023), Disp: , Rfl:    Calcium Polycarbophil (FIBERCON PO), Take 1 tablet by mouth at bedtime. (Patient not taking: Reported on 07/15/2023), Disp: , Rfl:    methocarbamol (ROBAXIN) 500 MG tablet, Take 1 tablet (500 mg total) by mouth every 8 (eight) hours as needed for muscle spasms. (Patient not taking:  Reported on 07/15/2023), Disp: 8 tablet, Rfl: 0   naproxen (NAPROSYN) 500 MG tablet, Take 1 tablet (500 mg total) by mouth 2 (two) times daily with a meal. (Patient not taking: Reported on 07/15/2023), Disp: 30 tablet, Rfl: 0   oxyCODONE-acetaminophen (PERCOCET) 5-325 MG tablet, Take 1 tablet by mouth every 6 (six) hours as needed. (Patient not taking: Reported on 07/15/2023), Disp: 20 tablet, Rfl: 0   pneumococcal 13-valent conjugate vaccine (PREVNAR 13) SUSP injection, Prevnar 13 (PF) 0.5 mL intramuscular syringe  PHARMACIST ADMINISTERED IMMUNIZATION ADMINISTERED AT TIME OF DISPENSING (Patient not taking: Reported on 07/15/2023), Disp: , Rfl:    predniSONE (DELTASONE) 10 MG tablet, prednisone 10 mg tablet (Patient not taking: Reported on 07/15/2023), Disp: , Rfl:    promethazine-dextromethorphan (PROMETHAZINE-DM) 6.25-15 MG/5ML syrup, Take 5 mLs by mouth 4 (four) times daily as needed. (Patient not taking: Reported on 07/15/2023), Disp: 100 mL, Rfl: 0   sennosides-docusate sodium (SENOKOT-S) 8.6-50 MG tablet, Take 2 tablets by mouth at bedtime.  (Patient not taking: Reported on 07/15/2023), Disp: , Rfl:    Vibegron (GEMTESA) 75 MG TABS, Take 1 tablet every day by oral route. (Patient not taking: Reported on 07/15/2023), Disp: , Rfl:     Physical Exam: Blood pressure 138/82, pulse 89, height 5\' 8"  (1.727 m), weight 196 lb (88.9 kg), SpO2 97%.   Affect appropriate Healthy:  appears stated age HEENT: normal Neck supple with no adenopathy JVP normal no bruits no thyromegaly Lungs clear with no wheezing and good diaphragmatic  motion Heart:  S1/S2 1/6 SEM murmur, no rub, gallop or click PMI normal Abdomen: benighn, BS positve, no tenderness, no AAA no bruit.  No HSM or HJR Distal pulses intact with no bruits No edema Neuro non-focal Skin warm and dry No muscular weakness   Labs:   Lab Results  Component Value Date   WBC 4.5 04/27/2022   HGB 11.2 (L) 04/27/2022   HCT 34.2 (L) 04/27/2022   MCV 96.6 04/27/2022   PLT 150 04/27/2022   No results for input(s): "NA", "K", "CL", "CO2", "BUN", "CREATININE", "CALCIUM", "PROT", "BILITOT", "ALKPHOS", "ALT", "AST", "GLUCOSE" in the last 168 hours.  Invalid input(s): "LABALBU" No results found for: "CKTOTAL", "CKMB", "CKMBINDEX", "TROPONINI" No results found for: "CHOL" No results found for: "HDL" No results found for: "LDLCALC" No results found for: "TRIG" No results found for: "CHOLHDL" No results found for: "LDLDIRECT"    Radiology: No results found.  EKG: SR LAFB poor R wave progression   ASSESSMENT AND PLAN:   Murmur:  benign 1/6 SEM with history of RF Update TTE Hypothyroid:  TSH normal continue current synthroid replacement Abnormal ECG:  with LAFB no high grade AV block Yearly ECG  TTE   F/U PRN pending echo results   Signed: Charlton Haws 07/15/2023, 2:31 PM

## 2023-07-15 ENCOUNTER — Ambulatory Visit: Payer: Medicare Other | Attending: Cardiovascular Disease | Admitting: Cardiovascular Disease

## 2023-07-15 ENCOUNTER — Encounter: Payer: Self-pay | Admitting: Cardiovascular Disease

## 2023-07-15 VITALS — BP 138/82 | HR 89 | Ht 68.0 in | Wt 196.0 lb

## 2023-07-15 DIAGNOSIS — I444 Left anterior fascicular block: Secondary | ICD-10-CM | POA: Diagnosis not present

## 2023-07-15 DIAGNOSIS — R011 Cardiac murmur, unspecified: Secondary | ICD-10-CM | POA: Insufficient documentation

## 2023-07-15 DIAGNOSIS — Z09 Encounter for follow-up examination after completed treatment for conditions other than malignant neoplasm: Secondary | ICD-10-CM | POA: Diagnosis not present

## 2023-07-15 NOTE — Patient Instructions (Signed)
 Medication Instructions:  Your physician recommends that you continue on your current medications as directed. Please refer to the Current Medication list given to you today.  *If you need a refill on your cardiac medications before your next appointment, please call your pharmacy*  Lab Work: If you have labs (blood work) drawn today and your tests are completely normal, you will receive your results only by: MyChart Message (if you have MyChart) OR A paper copy in the mail If you have any lab test that is abnormal or we need to change your treatment, we will call you to review the results.  Testing/Procedures: Your physician has requested that you have an echocardiogram. Echocardiography is a painless test that uses sound waves to create images of your heart. It provides your doctor with information about the size and shape of your heart and how well your heart's chambers and valves are working. This procedure takes approximately one hour. There are no restrictions for this procedure. Please do NOT wear cologne, perfume, aftershave, or lotions (deodorant is allowed). Please arrive 15 minutes prior to your appointment time.  Please note: We ask at that you not bring children with you during ultrasound (echo/ vascular) testing. Due to room size and safety concerns, children are not allowed in the ultrasound rooms during exams. Our front office staff cannot provide observation of children in our lobby area while testing is being conducted. An adult accompanying a patient to their appointment will only be allowed in the ultrasound room at the discretion of the ultrasound technician under special circumstances. We apologize for any inconvenience. Follow-Up: At [facility], you and your health needs are our priority.  As part of our continuing mission to provide you with exceptional heart care, we have created designated Provider Care Teams.  These Care Teams include your primary Cardiologist  (physician) and Advanced Practice Providers (APPs -  Physician Assistants and Nurse Practitioners) who all work together to provide you with the care you need, when you need it.  We recommend signing up for the patient portal called "MyChart".  Sign up information is provided on this After Visit Summary.  MyChart is used to connect with patients for Virtual Visits (Telemedicine).  Patients are able to view lab/test results, encounter notes, upcoming appointments, etc.  Non-urgent messages can be sent to your provider as well.   To learn more about what you can do with MyChart, go to [email or web address].    Your next appointment:   As needed   Provider:   Charlton Haws, MD

## 2023-07-16 ENCOUNTER — Telehealth: Payer: Self-pay | Admitting: Radiology

## 2023-07-16 ENCOUNTER — Ambulatory Visit: Payer: Medicare Other | Admitting: Physician Assistant

## 2023-07-16 ENCOUNTER — Encounter: Payer: Self-pay | Admitting: Physician Assistant

## 2023-07-16 DIAGNOSIS — M19019 Primary osteoarthritis, unspecified shoulder: Secondary | ICD-10-CM

## 2023-07-16 DIAGNOSIS — M19011 Primary osteoarthritis, right shoulder: Secondary | ICD-10-CM | POA: Diagnosis not present

## 2023-07-16 NOTE — Telephone Encounter (Signed)
Correction, can this patient be worked in for a right AC joint injection?

## 2023-07-16 NOTE — Telephone Encounter (Signed)
Can Dr. Shon Baton work this patient in next week for a right glenohumeral injection?

## 2023-07-16 NOTE — Progress Notes (Signed)
HPI: [name] returns today to go over the MRI of her right shoulder.  She states her shoulder pain remains unchanged.  She points to the anterior aspect of the shoulder as her source of pain.  She has had a series of mechanical falls prior to the MRI.  Radiographs of her shoulder showed no evidence of fracture and the glenohumeral joint was well-maintained. MRI dated 06/27/2023 is reviewed with the patient.  Images shown.  MRI shows moderate glenohumeral joint arthritis.  Tendinosis of the infraspinatus and subscapularis which is moderate.  No full-thickness rotator cuff tears.  Partial bursal tear of the supraspinatus seen.  Moderate arthritic changes at the [facility] joint.  With small amount of subacromial/subdeltoid bursal fluid with synovitis.  Edema is seen in the anterior inferior glenoid with cortical irregularity possible subacute nondisplaced fracture.   Review of systems see HPI otherwise negative.    Physical exam: General Well-developed well-nourished pleasant female in no acute distress Right shoulder she has tenderness over the [facility] joint.  Abduction of the arm across the chest causes pain.  Internal/external rotation of the shoulder seems to cause minimal discomfort.  Nontender over the medial border scapula.   Impression: Right shoulder pain Right AC joint arthritis Right shoulder glenohumeral joint arthritis  Plan: Given her clinical exam today and she is maximally tender over the [facility] joint recommend AC joint injection under ultrasound.  Will set this up for her with Dr. Shon Baton hopefully next week.  Then have her follow-up with Dr. Magnus Ivan approximately 2 weeks after the injection to see what type of results she got.  Questions were encouraged and answered at length.

## 2023-07-20 ENCOUNTER — Ambulatory Visit (INDEPENDENT_AMBULATORY_CARE_PROVIDER_SITE_OTHER): Payer: Medicare Other | Admitting: Sports Medicine

## 2023-07-20 ENCOUNTER — Other Ambulatory Visit: Payer: Self-pay

## 2023-07-20 ENCOUNTER — Encounter: Payer: Self-pay | Admitting: Sports Medicine

## 2023-07-20 DIAGNOSIS — G8929 Other chronic pain: Secondary | ICD-10-CM

## 2023-07-20 DIAGNOSIS — M25511 Pain in right shoulder: Secondary | ICD-10-CM

## 2023-07-20 DIAGNOSIS — M19019 Primary osteoarthritis, unspecified shoulder: Secondary | ICD-10-CM | POA: Diagnosis not present

## 2023-07-20 MED ORDER — LIDOCAINE HCL 1 % IJ SOLN
0.5000 mL | INTRAMUSCULAR | Status: AC | PRN
  Administered 2023-07-20: .5 mL

## 2023-07-20 MED ORDER — METHYLPREDNISOLONE ACETATE 40 MG/ML IJ SUSP
40.0000 mg | INTRAMUSCULAR | Status: AC | PRN
  Administered 2023-07-20: 40 mg via INTRA_ARTICULAR

## 2023-07-20 NOTE — Telephone Encounter (Signed)
Called and scheduled patient for 07/20/2023 at 3:15pm.

## 2023-07-20 NOTE — Progress Notes (Signed)
      Procedure Note  Patient: [name]             Date of Birth: February 27, 1941           MRN: [id number]             Visit Date: 07/20/2023  Procedures: Visit Diagnoses:  1. AC joint arthropathy   2. Chronic right shoulder pain    Medium Joint Inj: R acromioclavicular on 07/20/2023 4:03 PM Indications: pain and diagnostic evaluation Details: 25 G 1.5 in needle, ultrasound-guided anterior approach Medications: 0.5 mL lidocaine 1 %; 40 mg methylPREDNISolone acetate 40 MG/ML  US-guided AC Joint injection, right shoulder After discussion on risks/benefits/indications, informed verbal consent was obtained. A timeout was then performed. The patient was seated in examination room. The area overlying the [facility] joint of the shoulder was prepped with Betadine and alcohol swab then utilizing ultrasound guidance, patient's AC joint was injected using a 25G, 1.5" needle with 0.5:1.24mL lidocaine:depomedrol of injectate via an out-of-plane, walk-down approach. Visualization of injectate flow was noted under ultrasound guidance. Patient tolerated the procedure well without immediate complications.   Procedure, treatment alternatives, risks and benefits explained, specific risks discussed. Consent was given by the patient. Immediately prior to procedure a time out was called to verify the correct patient, procedure, equipment, support staff and site/side marked as required. Patient was prepped and draped in the usual sterile fashion.     - follow-up with Dr. Magnus Ivan in 2 weeks as indicated; I am happy to see them as needed  Madelyn Brunner, DO Primary Care Sports Medicine Physician  [facility] - Orthopedics  This note was dictated using Dragon naturally speaking software and may contain errors in syntax, spelling, or content which have not been identified prior to signing this note.

## 2023-08-06 ENCOUNTER — Ambulatory Visit (HOSPITAL_COMMUNITY): Payer: Medicare Other | Attending: Cardiovascular Disease

## 2023-08-06 DIAGNOSIS — R011 Cardiac murmur, unspecified: Secondary | ICD-10-CM | POA: Diagnosis not present

## 2023-08-06 DIAGNOSIS — Z09 Encounter for follow-up examination after completed treatment for conditions other than malignant neoplasm: Secondary | ICD-10-CM | POA: Insufficient documentation

## 2023-08-06 LAB — ECHOCARDIOGRAM COMPLETE
Area-P 1/2: 4.07 cm2
Est EF: 50
MV VTI: 3.96 cm2
P 1/2 time: 419 ms
S' Lateral: 2.9 cm

## 2023-08-10 ENCOUNTER — Ambulatory Visit: Payer: Medicare Other | Admitting: Physician Assistant

## 2023-08-17 ENCOUNTER — Encounter: Payer: Self-pay | Admitting: Physician Assistant

## 2023-08-17 ENCOUNTER — Encounter: Payer: Self-pay | Admitting: Sports Medicine

## 2023-08-17 ENCOUNTER — Ambulatory Visit (INDEPENDENT_AMBULATORY_CARE_PROVIDER_SITE_OTHER): Payer: Medicare Other | Admitting: Sports Medicine

## 2023-08-17 ENCOUNTER — Other Ambulatory Visit: Payer: Self-pay

## 2023-08-17 ENCOUNTER — Ambulatory Visit (INDEPENDENT_AMBULATORY_CARE_PROVIDER_SITE_OTHER): Payer: Medicare Other | Admitting: Physician Assistant

## 2023-08-17 DIAGNOSIS — M25511 Pain in right shoulder: Secondary | ICD-10-CM | POA: Diagnosis not present

## 2023-08-17 DIAGNOSIS — G8929 Other chronic pain: Secondary | ICD-10-CM

## 2023-08-17 MED ORDER — LIDOCAINE HCL 1 % IJ SOLN
2.0000 mL | INTRAMUSCULAR | Status: AC | PRN
  Administered 2023-08-17: 2 mL

## 2023-08-17 MED ORDER — METHYLPREDNISOLONE ACETATE 40 MG/ML IJ SUSP
80.0000 mg | INTRAMUSCULAR | Status: AC | PRN
  Administered 2023-08-17: 80 mg via INTRA_ARTICULAR

## 2023-08-17 MED ORDER — BUPIVACAINE HCL 0.25 % IJ SOLN
2.0000 mL | INTRAMUSCULAR | Status: AC | PRN
  Administered 2023-08-17: 2 mL via INTRA_ARTICULAR

## 2023-08-17 NOTE — Progress Notes (Signed)
 HPI: Mrs. Mcaulay returns today status post right shoulder AC joint injection 07/20/2023.  States the [facility] joint pain is somewhat better.  However she continues to have pain in the shoulder.  She states her shoulder pain is 10 out of 10 pain at its worst.  She is not interested in any type of surgical intervention.  States she is unable to sleep on her right side due to the pain in the arm.  Denies any radicular symptoms down the right arm.  Review of systems: Negative for fevers chills.  See HPI  Physical exam: General Well-developed well-nourished female in no acute distress.  Right shoulder pain with attempts of overhead activity.  Passively it forward flex her to approximately 160 degrees.  She has pain with external and internal rotation of the right shoulder.  Minimal tenderness of the AC joint.  Abduction right arm across chest causes minimal discomfort.   Impression: Right shoulder glenohumeral joint arthritis Right AC joint arthritis  Plan: We will set her up for an intra-articular injection of the right shoulder given the fact the patient does not want any type of surgical intervention see if this affords her some relief.  Questions were encouraged and answered at length.  Discussed MRI findings with her again today.  Follow-up 2 weeks after injection with Dr. Vernetta.

## 2023-08-17 NOTE — Progress Notes (Signed)
   Procedure Note  Patient: [name]             Date of Birth: [birth year]           MRN: [id number]             Visit Date: 08/17/2023  Procedures: Visit Diagnoses:  1. Chronic right shoulder pain    Large Joint Inj: R glenohumeral on 08/17/2023 10:16 AM Indications: pain Details: 22 G 3.5 in needle, ultrasound-guided posterior approach Medications: 2 mL lidocaine  1 %; 2 mL bupivacaine  0.25 %; 80 mg methylPREDNISolone  acetate 40 MG/ML Outcome: tolerated well, no immediate complications  US -guided glenohumeral joint injection, right shoulder After discussion on risks/benefits/indications, informed verbal consent was obtained. A timeout was then performed. The patient was positioned lying lateral recumbent on examination table. The patient's shoulder was prepped with betadine and multiple alcohol swabs and utilizing ultrasound guidance, the patient's glenohumeral joint was identified on ultrasound. Using ultrasound guidance a 22-gauge, 3.5 inch needle with a mixture of 2:2:2 cc's lidocaine :bupivicaine:depomedrol was directed from a lateral to medial direction via in-plane technique into the glenohumeral joint with visualization of appropriate spread of injectate into the joint. Patient tolerated the procedure well without immediate complications.      Procedure, treatment alternatives, risks and benefits explained, specific risks discussed. Consent was given by the patient. Immediately prior to procedure a time out was called to verify the correct patient, procedure, equipment, support staff and site/side marked as required. Patient was prepped and draped in the usual sterile fashion.     - f/u with Dr. Vernetta in 2 weeks  Lonell Sprang, DO Primary Care Sports Medicine Physician  [facility] - Orthopedics  This note was dictated using Dragon naturally speaking software and may contain errors in syntax, spelling, or content which have not been identified prior to signing  this note.

## 2023-08-20 DIAGNOSIS — E039 Hypothyroidism, unspecified: Secondary | ICD-10-CM | POA: Diagnosis not present

## 2023-08-20 DIAGNOSIS — R7303 Prediabetes: Secondary | ICD-10-CM | POA: Diagnosis not present

## 2023-08-20 DIAGNOSIS — E782 Mixed hyperlipidemia: Secondary | ICD-10-CM | POA: Diagnosis not present

## 2023-08-20 DIAGNOSIS — R809 Proteinuria, unspecified: Secondary | ICD-10-CM | POA: Diagnosis not present

## 2023-08-26 DIAGNOSIS — M25511 Pain in right shoulder: Secondary | ICD-10-CM | POA: Diagnosis not present

## 2023-08-26 DIAGNOSIS — Z1211 Encounter for screening for malignant neoplasm of colon: Secondary | ICD-10-CM | POA: Diagnosis not present

## 2023-08-26 DIAGNOSIS — Z13820 Encounter for screening for osteoporosis: Secondary | ICD-10-CM | POA: Diagnosis not present

## 2023-08-26 DIAGNOSIS — R748 Abnormal levels of other serum enzymes: Secondary | ICD-10-CM | POA: Diagnosis not present

## 2023-08-26 DIAGNOSIS — N3281 Overactive bladder: Secondary | ICD-10-CM | POA: Diagnosis not present

## 2023-08-26 DIAGNOSIS — I10 Essential (primary) hypertension: Secondary | ICD-10-CM | POA: Diagnosis not present

## 2023-08-26 DIAGNOSIS — F331 Major depressive disorder, recurrent, moderate: Secondary | ICD-10-CM | POA: Diagnosis not present

## 2023-08-26 DIAGNOSIS — R7303 Prediabetes: Secondary | ICD-10-CM | POA: Diagnosis not present

## 2023-08-26 DIAGNOSIS — R809 Proteinuria, unspecified: Secondary | ICD-10-CM | POA: Diagnosis not present

## 2023-08-26 DIAGNOSIS — Z87442 Personal history of urinary calculi: Secondary | ICD-10-CM | POA: Diagnosis not present

## 2023-08-26 DIAGNOSIS — E782 Mixed hyperlipidemia: Secondary | ICD-10-CM | POA: Diagnosis not present

## 2023-08-26 DIAGNOSIS — R42 Dizziness and giddiness: Secondary | ICD-10-CM | POA: Diagnosis not present

## 2023-08-26 DIAGNOSIS — E039 Hypothyroidism, unspecified: Secondary | ICD-10-CM | POA: Diagnosis not present

## 2023-08-31 ENCOUNTER — Encounter: Payer: Self-pay | Admitting: Physician Assistant

## 2023-08-31 ENCOUNTER — Ambulatory Visit (INDEPENDENT_AMBULATORY_CARE_PROVIDER_SITE_OTHER): Payer: Medicare Other | Admitting: Physician Assistant

## 2023-08-31 DIAGNOSIS — M19011 Primary osteoarthritis, right shoulder: Secondary | ICD-10-CM

## 2023-08-31 NOTE — Progress Notes (Signed)
HPI: [name] returns today for follow-up of her right shoulder.  She underwent a right shoulder glenohumeral injection on 08/17/2023.  States that she is 80% better.  Notes that her range of motion is better.  States that she continues to have some discomfort but is tolerable at this point in time.  Given an MRI of her shoulder back in November showed moderate osteoarthritis of the shoulder and also bone marrow edema of the anterior inferior glenoid which was concerning for nondisplaced fracture.  Review of systems: See HPI otherwise negative  Physical exam: General Well-developed well-nourished female no acute distress. Psych: Alert and oriented x 3 Right shoulder passively flexion to approximately 160 degrees no significant pain with this.  No significant pain with internal/external rotation shoulder.  Abduction right arm across the chest causes no pain.   Impression: Right shoulder glenohumeral joint arthritis Right AC joint arthritis   Plan: She is someone who is not interested in any type of surgical intervention.  Therefore recommend she continue to work on range of motion of the shoulder.  She understands she can have shoulder injections as often as every 3 months.  Questions were encouraged and answered at length.  Follow-up as needed.

## 2023-09-01 LAB — EXTERNAL GENERIC LAB PROCEDURE: COLOGUARD: POSITIVE — AB

## 2023-09-01 LAB — COLOGUARD: COLOGUARD: POSITIVE — AB

## 2023-09-03 DIAGNOSIS — R350 Frequency of micturition: Secondary | ICD-10-CM | POA: Diagnosis not present

## 2023-09-03 DIAGNOSIS — N3946 Mixed incontinence: Secondary | ICD-10-CM | POA: Diagnosis not present

## 2023-09-09 NOTE — Progress Notes (Signed)
 Referring Provider:  Shona Norleen PEDLAR, MD Primary Care Physician:  Shona Norleen PEDLAR, MD Primary Gastroenterologist:  Dr. Shaaron  Chief Complaint  Patient presents with   cologuard     Positive cologuard      HPI:   [name] is a 83 y.o. female presenting today at the request of  Shona Norleen PEDLAR, MD for blood in the stool.  Per chart review, patient had positive Cologuard 08/26/2023.  Labs available under [facility] dated 08/20/2023 with hemoglobin 13.9  Today:  Stools have been dark brown. No black stool.  No brbpr. Has Bms daily. No constipation. Stools come out in different pieces rather than 1 long piece. Occasional abdominal pain with stool urgency. This isn't very often.  This is a chronic intermittent issue for her and actually occurs much less frequent than it used to.  Trying to cut back on eating so she can lose weight. No nausea, vomiting. GERD is controlled on Prilosec. No dysphagia.   She lives alone.  Takes care of anterior and exterior of her house, cooks, and goes to the [employer] for exercise.  Last colonoscopy 02/12/2011 due to history of colon polyps.  She was found to have sigmoid diverticulosis, otherwise negative exam.  Recommended 5 years surveillance due to history of polyps.  Past Medical History:  Diagnosis Date   Allergy    takes Claritin  daily   Arthritis    Complication of anesthesia    Constipation    takes Senokot daily   Depression    takes Lexapro  daily   Family history of adverse reaction to anesthesia    sister has a hard time waking up   GERD (gastroesophageal reflux disease)    History of blood clots 1979   after C/S clot went to lung   History of bronchitis    3 yrs ago   History of migraine    Hypothyroidism    takes Synthroid  daily   Joint pain    Joint swelling    Personal history of colonic polyps    benign   PONV (postoperative nausea and vomiting)    Rheumatic fever 1958   hospitalized for three months   Urinary  leakage    takes Ditropan  daily   Vitamin D  deficiency    takes Vit D daily    Past Surgical History:  Procedure Laterality Date   APPENDECTOMY     incidental at time of C-section   BACK SURGERY  1986   cataract surgery Bilateral    CESAREAN SECTION  1979   CHOLECYSTECTOMY  1982   COLONOSCOPY  2004   Dr. Randol polyps, left sided diverticula. Path unavailable.    COLONOSCOPY  02/12/2011   Procedure: COLONOSCOPY;  Surgeon: Lamar CHRISTELLA Shaaron, MD;  [facility];  Service: Endoscopy;  Laterality: N/A;   ESOPHAGOGASTRODUODENOSCOPY  02/12/2011   Procedure: ESOPHAGOGASTRODUODENOSCOPY (EGD);  Surgeon: Lamar CHRISTELLA Shaaron, MD;  [facility]: [facility];  Service: Endoscopy;  Laterality: N/A;   GASTRIC ROUX-EN-Y N/A 06/19/2015   Procedure: LAPAROSCOPIC ROUX-EN-Y GASTRIC BYPASS WITH UPPER ENDOSCOPY;  Surgeon: Morene Olives, MD;  Location: [facility];  Service: General;  Laterality: N/A;   KNEE SURGERY  1999   left   TOTAL KNEE ARTHROPLASTY  10/2009   left   TOTAL KNEE ARTHROPLASTY Right 11/25/2016   Procedure: RIGHT TOTAL KNEE ARTHROPLASTY;  Surgeon: Lonni CINDERELLA Poli, MD;  Location: [facility];  Service: Orthopedics;  Laterality: Right;   UPPER GI ENDOSCOPY  06/19/2015  Procedure: UPPER GI ENDOSCOPY;  Surgeon: Morene Olives, MD;  Location: [facility];  Service: General;;    Current Outpatient Medications  Medication Sig Dispense Refill   acetaminophen  (TYLENOL ) 325 MG tablet Take 325 mg by mouth every 6 (six) hours as needed for mild pain or headache.     Ascorbic Acid  (VITAMIN C ) 500 MG CHEW Chew 500 mg by mouth 2 (two) times daily.     betamethasone valerate (VALISONE) 0.1 % cream      Calcium  600-400 MG-UNIT CHEW Chew 1 tablet by mouth 3 (three) times daily.     Calcium  Polycarbophil (FIBERCON PO) Take 1 tablet by mouth at bedtime.     cholecalciferol  (VITAMIN D ) 1000 units tablet Take 1,000 Units by mouth daily.     Cyanocobalamin  (VITAMIN B-12) 2500 MCG SUBL Place 2,500 mcg  under the tongue daily.      escitalopram  (LEXAPRO ) 20 MG tablet Take 20 mg by mouth daily.       levothyroxine  (SYNTHROID ) 137 MCG tablet Take 137 mcg by mouth daily before breakfast.     losartan (COZAAR) 50 MG tablet Take 50 mg by mouth daily.     Multiple Vitamin (MULTIVITAMIN WITH MINERALS) TABS tablet Take 1 tablet by mouth 2 (two) times daily.      omeprazole (PRILOSEC OTC) 20 MG tablet Take 20 mg by mouth daily.     Vibegron (GEMTESA) 75 MG TABS      No current facility-administered medications for this visit.    Allergies as of 09/10/2023 - Review Complete 09/10/2023  Allergen Reaction Noted   No known allergies  11/24/2016    Family History  Problem Relation Age of Onset   Diabetes Other        siblings, mother   Lung disease Father        black lung, died young   Heart disease Brother    Colon cancer Neg Hx     Social History   Socioeconomic History   Marital status: Single    Spouse name: Not on file   Number of children: 1   Years of education: Not on file   Highest education level: Not on file  Occupational History    Employer: retired  Tobacco Use   Smoking status: Never   Smokeless tobacco: Never  Vaping Use   Vaping status: Never Used  Substance and Sexual Activity   Alcohol use: No   Drug use: No   Sexual activity: Not on file  Other Topics Concern   Not on file  Social History Narrative   Not on file   Social Drivers of Health   Financial Resource Strain: Not on file  Food Insecurity: Not on file  Transportation Needs: Not on file  Physical Activity: Not on file  Stress: Not on file  Social Connections: Not on file  Intimate Partner Violence: Not on file    Review of Systems: Gen: Denies any fever, chills, cold or flu like symptoms, pre-syncope, or syncope.  CV: Denies chest pain, heart palpitations.  Resp: Denies shortness of breath, cough.  GI: See HPI GU : Denies urinary burning, urinary frequency, urinary hesitancy MS: Denies  joint pain.  Derm: Denies rash. Psych: Denies depression, anxiety. Heme: See HPI  Physical Exam: BP 137/67 (BP Location: Right Arm, Patient Position: Sitting, Cuff Size: Normal)   Pulse 80   Temp 97.7 F (36.5 C) (Temporal)   Ht 5' 5 (1.651 m)   Wt 199 lb (90.3 kg)   BMI  33.12 kg/m  General:   Alert and oriented. Pleasant and cooperative. Well-nourished and well-developed.  Head:  Normocephalic and atraumatic. Eyes:  Without icterus, sclera clear and conjunctiva pink.  Ears:  Normal auditory acuity. Lungs:  Clear to auscultation bilaterally. No wheezes, rales, or rhonchi. No distress.  Heart:  S1, S2 present without murmurs appreciated.  Abdomen:  +BS, soft, non-tender and non-distended. No HSM noted. No guarding or rebound. No masses appreciated.  Rectal:  Deferred  Msk:  Symmetrical without gross deformities. Normal posture. Extremities:  Without edema. Neurologic:  Alert and  oriented x4;  grossly normal neurologically. Skin:  Intact without significant lesions or rashes. Psych:  Normal mood and affect.    Assessment:  83 year old female with history of hypothyroidism, depression, HTN, GERD, rheumatic fever, colon polyps, presenting today to discuss scheduling colonoscopy due to recent positive Cologuard completed 08/26/2023.  Clinically, patient is doing well without any significant GI symptoms or alarm symptoms.  Her last colonoscopy was in July 2012.  She had no polyps on that exam, but was advised to repeat a colonoscopy in 5 years due to history of colon polyps.  As patient is in fairly good health overall, remains active even a 83 years old, I feel that is reasonable to proceed with a colonoscopy due to recent positive Cologuard.   Plan:  Proceed with colonoscopy with propofol  by Dr. Shaaron in near future. The risks, benefits, and alternatives have been discussed with the patient in detail. The patient states understanding and desires to proceed.  ASA 3 due to  age Follow-up prn   Josette Rudy RIGGERS [facility] 09/10/2023

## 2023-09-10 ENCOUNTER — Ambulatory Visit: Payer: Medicare Other | Admitting: Gastroenterology

## 2023-09-10 ENCOUNTER — Encounter: Payer: Self-pay | Admitting: *Deleted

## 2023-09-10 ENCOUNTER — Encounter: Payer: Self-pay | Admitting: Gastroenterology

## 2023-09-10 ENCOUNTER — Other Ambulatory Visit: Payer: Self-pay | Admitting: *Deleted

## 2023-09-10 VITALS — BP 137/67 | HR 80 | Temp 97.7°F | Ht 65.0 in | Wt 199.0 lb

## 2023-09-10 DIAGNOSIS — Z860100 Personal history of colon polyps, unspecified: Secondary | ICD-10-CM

## 2023-09-10 DIAGNOSIS — R195 Other fecal abnormalities: Secondary | ICD-10-CM

## 2023-09-10 MED ORDER — PEG 3350-KCL-NA BICARB-NACL 420 G PO SOLR: 4000.0000 mL | Freq: Once | ORAL | 0 refills | Status: AC

## 2023-09-10 NOTE — Patient Instructions (Addendum)
 We will get you scheduled for colonoscopy in the near future with Dr. Riley Cheadle.  We will follow-up with you in the office as needed.  It was nice to meet you today!  Shana Daring, PA-C [facility]

## 2023-09-11 ENCOUNTER — Encounter: Payer: Self-pay | Admitting: Gastroenterology

## 2023-10-19 ENCOUNTER — Other Ambulatory Visit: Payer: Self-pay

## 2023-10-19 ENCOUNTER — Encounter (HOSPITAL_COMMUNITY)
Admission: RE | Admit: 2023-10-19 | Discharge: 2023-10-19 | Disposition: A | Discharge status: Home / Self Care | Payer: Medicare Other | Source: Ambulatory Visit | Attending: Internal Medicine | Admitting: Internal Medicine

## 2023-10-19 ENCOUNTER — Encounter (HOSPITAL_COMMUNITY): Payer: Self-pay

## 2023-10-19 NOTE — Pre-Procedure Instructions (Signed)
 Attempted pre-op phonecall. Left VM for her to call us back.

## 2023-10-21 ENCOUNTER — Ambulatory Visit (HOSPITAL_BASED_OUTPATIENT_CLINIC_OR_DEPARTMENT_OTHER): Admitting: Anesthesiology

## 2023-10-21 ENCOUNTER — Ambulatory Visit (HOSPITAL_COMMUNITY)
Admission: RE | Admit: 2023-10-21 | Discharge: 2023-10-21 | Disposition: A | Discharge status: Home / Self Care | Payer: Medicare Other | Source: Ambulatory Visit | Attending: Internal Medicine | Admitting: Internal Medicine

## 2023-10-21 ENCOUNTER — Ambulatory Visit (HOSPITAL_COMMUNITY): Admitting: Anesthesiology

## 2023-10-21 ENCOUNTER — Encounter (HOSPITAL_COMMUNITY): Payer: Self-pay | Admitting: Internal Medicine

## 2023-10-21 ENCOUNTER — Other Ambulatory Visit: Payer: Self-pay

## 2023-10-21 ENCOUNTER — Encounter (HOSPITAL_COMMUNITY)
Admission: RE | Disposition: A | Discharge status: Home / Self Care | Payer: Self-pay | Source: Ambulatory Visit | Attending: Internal Medicine

## 2023-10-21 DIAGNOSIS — Z1211 Encounter for screening for malignant neoplasm of colon: Secondary | ICD-10-CM | POA: Diagnosis not present

## 2023-10-21 DIAGNOSIS — Z860101 Personal history of adenomatous and serrated colon polyps: Secondary | ICD-10-CM

## 2023-10-21 DIAGNOSIS — K5730 Diverticulosis of large intestine without perforation or abscess without bleeding: Secondary | ICD-10-CM | POA: Insufficient documentation

## 2023-10-21 DIAGNOSIS — F32A Depression, unspecified: Secondary | ICD-10-CM | POA: Diagnosis not present

## 2023-10-21 DIAGNOSIS — R195 Other fecal abnormalities: Secondary | ICD-10-CM | POA: Diagnosis not present

## 2023-10-21 DIAGNOSIS — D125 Benign neoplasm of sigmoid colon: Secondary | ICD-10-CM

## 2023-10-21 DIAGNOSIS — E039 Hypothyroidism, unspecified: Secondary | ICD-10-CM | POA: Insufficient documentation

## 2023-10-21 DIAGNOSIS — K635 Polyp of colon: Secondary | ICD-10-CM | POA: Diagnosis not present

## 2023-10-21 DIAGNOSIS — D124 Benign neoplasm of descending colon: Secondary | ICD-10-CM | POA: Insufficient documentation

## 2023-10-21 DIAGNOSIS — Z860100 Personal history of colon polyps, unspecified: Secondary | ICD-10-CM | POA: Diagnosis not present

## 2023-10-21 DIAGNOSIS — K640 First degree hemorrhoids: Secondary | ICD-10-CM | POA: Insufficient documentation

## 2023-10-21 DIAGNOSIS — K219 Gastro-esophageal reflux disease without esophagitis: Secondary | ICD-10-CM | POA: Diagnosis not present

## 2023-10-21 HISTORY — PX: POLYPECTOMY: SHX5525

## 2023-10-21 HISTORY — PX: COLONOSCOPY WITH PROPOFOL: SHX5780

## 2023-10-21 SURGERY — COLONOSCOPY WITH PROPOFOL
Anesthesia: General

## 2023-10-21 MED ORDER — LIDOCAINE HCL (PF) 2 % IJ SOLN
INTRAMUSCULAR | Status: DC | PRN
  Administered 2023-10-21: 50 mg via INTRADERMAL

## 2023-10-21 MED ORDER — LACTATED RINGERS IV SOLN
INTRAVENOUS | Status: DC | PRN
Start: 2023-10-21 — End: 2023-10-21

## 2023-10-21 MED ORDER — PHENYLEPHRINE HCL (PRESSORS) 10 MG/ML IV SOLN
INTRAVENOUS | Status: DC | PRN
  Administered 2023-10-21: 100 ug via INTRAVENOUS

## 2023-10-21 MED ORDER — STERILE WATER FOR IRRIGATION IR SOLN
Status: DC | PRN
  Administered 2023-10-21: 100 mL

## 2023-10-21 MED ORDER — PROPOFOL 500 MG/50ML IV EMUL
INTRAVENOUS | Status: DC | PRN
  Administered 2023-10-21: 150 ug/kg/min via INTRAVENOUS

## 2023-10-21 MED ORDER — EPHEDRINE SULFATE-NACL 50-0.9 MG/10ML-% IV SOSY
PREFILLED_SYRINGE | INTRAVENOUS | Status: DC | PRN
  Administered 2023-10-21: 10 mg via INTRAVENOUS
  Administered 2023-10-21 (×3): 5 mg via INTRAVENOUS

## 2023-10-21 MED ORDER — PROPOFOL 10 MG/ML IV BOLUS
INTRAVENOUS | Status: DC | PRN
  Administered 2023-10-21: 100 mg via INTRAVENOUS

## 2023-10-21 NOTE — Anesthesia Procedure Notes (Signed)
 Date/Time: 10/21/2023 2:00 PM  Performed by: Julian Reil, CRNAPre-anesthesia Checklist: Patient identified, Emergency Drugs available, Suction available and Patient being monitored Patient Re-evaluated:Patient Re-evaluated prior to induction Oxygen Delivery Method: Nasal cannula Induction Type: IV induction Placement Confirmation: positive ETCO2

## 2023-10-21 NOTE — Op Note (Addendum)
 [facility] Patient Name: [name] Procedure Date: 10/21/2023 1:32 PM MRN: [id number] Date of Birth: 10-17-1940 Attending MD: Gennette Pac , MD, [id number] CSN: [id number] Age: 83 Admit Type: Outpatient Procedure:                Colonoscopy Indications:              High risk colon cancer surveillance: Personal                            history of colonic polyps Providers:                Gennette Pac, MD, Crystal Page, Dyann Ruddle Referring MD:              Medicines:                Propofol per Anesthesia Complications:            No immediate complications. Estimated Blood Loss:     Estimated blood loss was minimal. Procedure:                Pre-Anesthesia Assessment:                           - Prior to the procedure, a History and Physical                            was performed, and patient medications and                            allergies were reviewed. The patient's tolerance of                            previous anesthesia was also reviewed. The risks                            and benefits of the procedure and the sedation                            options and risks were discussed with the patient.                            All questions were answered, and informed consent                            was obtained. Prior Anticoagulants: The patient has                            taken no anticoagulant or antiplatelet agents. ASA                            Grade Assessment: III - A patient with severe                            systemic disease. After reviewing the risks and  benefits, the patient was deemed in satisfactory                            condition to undergo the procedure.                           After obtaining informed consent, the colonoscope                            was passed under direct vision. Throughout the                            procedure, the patient's blood pressure, pulse, and                             oxygen saturations were monitored continuously. The                            [id number]) scope was introduced through the                            anus and advanced to the the cecum, identified by                            appendiceal orifice and ileocecal valve. The                            colonoscopy was performed without difficulty. The                            patient tolerated the procedure well. The quality                            of the bowel preparation was adequate. The                            ileocecal valve, appendiceal orifice, and rectum                            were photographed. The entire colon was well                            visualized. Scope In: 2:07:26 PM Scope Out: 2:26:03 PM Scope Withdrawal Time: 0 hours 9 minutes 45 seconds  Total Procedure Duration: 0 hours 18 minutes 37 seconds  Findings:      The perianal and digital rectal examinations were normal.      Many medium-mouthed and small-mouthed diverticula were found in the       sigmoid colon.      A 5 mm polyp was found in the mid sigmoid colon. The polyp was sessile.       The polyp was removed with a cold snare. Resection and retrieval were       complete. Estimated blood loss was minimal.      A 15 mm polyp was found in the descending colon.  The polyp was       pedunculated. The polyp was removed with a hot snare. Resection and       retrieval were complete. Estimated blood loss: none.      Non-bleeding internal hemorrhoids were found during retroflexion. The       hemorrhoids were moderate, medium-sized and Grade I (internal       hemorrhoids that do not prolapse).      The exam was otherwise without abnormality on direct and retroflexion       views. Impression:               - Diverticulosis in the sigmoid colon.                           - One 5 mm polyp in the mid sigmoid colon, removed                            with a cold snare. Resected and  retrieved.                           - One 15 mm polyp in the descending colon, removed                            with a hot snare. Resected and retrieved.                           - Non-bleeding internal hemorrhoids.                           - The examination was otherwise normal on direct                            and retroflexion views. Moderate Sedation:      Moderate (conscious) sedation was personally administered by an       anesthesia professional. The following parameters were monitored: oxygen       saturation, heart rate, blood pressure, respiratory rate, EKG, adequacy       of pulmonary ventilation, and response to care. Recommendation:           - Patient has a contact number available for                            emergencies. The signs and symptoms of potential                            delayed complications were discussed with the                            patient. Return to normal activities tomorrow.                            Written discharge instructions were provided to the                            patient.                           -  Advance diet as tolerated.                           - Continue present medications.                           - Repeat colonoscopy date to be determined after                            pending pathology results are reviewed for                            surveillance.                           - Return to GI office (date not yet determined). Procedure Code(s):        --- Professional ---                           [phone], Colonoscopy, flexible; with removal of                            tumor(s), polyp(s), or other lesion(s) by snare                            technique Diagnosis Code(s):        --- Professional ---                           Z86.010, Personal history of colonic polyps                           K64.0, First degree hemorrhoids                           D12.5, Benign neoplasm of sigmoid colon                            D12.4, Benign neoplasm of descending colon                           K57.30, Diverticulosis of large intestine without                            perforation or abscess without bleeding CPT copyright 2022 American Medical Association. All rights reserved. The codes documented in this report are preliminary and upon coder review may  be revised to meet current compliance requirements. Ariel Friends. Keionte Swicegood, MD Gennette Pac, MD 10/21/2023 2:33:37 PM This report has been signed electronically. Number of Addenda: 0

## 2023-10-21 NOTE — Anesthesia Postprocedure Evaluation (Signed)
 Anesthesia Post Note  Patient: [name]  Procedure(s) Performed: COLONOSCOPY WITH PROPOFOL POLYPECTOMY  Patient location during evaluation: PACU Anesthesia Type: General Level of consciousness: awake and alert Pain management: pain level controlled Vital Signs Assessment: post-procedure vital signs reviewed and stable Respiratory status: spontaneous breathing, nonlabored ventilation, respiratory function stable and patient connected to nasal cannula oxygen Cardiovascular status: blood pressure returned to baseline and stable Postop Assessment: no apparent nausea or vomiting Anesthetic complications: no   There were no known notable events for this encounter.   Last Vitals:  Vitals:   10/21/23 1429 10/21/23 1432  BP: (!) 100/44 (!) 109/59  Pulse: 75 76  Resp: 18 20  Temp: 36.6 C   SpO2: 97% 100%    Last Pain:  Vitals:   10/21/23 1432  TempSrc:   PainSc: 0-No pain                 Kebra Lowrimore L Armanie Ullmer

## 2023-10-21 NOTE — Transfer of Care (Signed)
 Immediate Anesthesia Transfer of Care Note  Patient: [name]  Procedure(s) Performed: COLONOSCOPY WITH PROPOFOL POLYPECTOMY  Patient Location: Short Stay  Anesthesia Type:General  Level of Consciousness: awake  Airway & Oxygen Therapy: Patient Spontanous Breathing  Post-op Assessment: Report given to RN and Post -op Vital signs reviewed and stable  Post vital signs: Reviewed and stable  Last Vitals:  Vitals Value Taken Time  BP    Temp    Pulse    Resp    SpO2      Last Pain:  Vitals:   10/21/23 1401  TempSrc:   PainSc: 0-No pain      Patients Stated Pain Goal: 5 (10/21/23 1202)  Complications: No notable events documented.

## 2023-10-21 NOTE — H&P (Signed)
 @LOGO @   Primary Care Physician:  Benita Stabile, MD Primary Gastroenterologist:  Dr.   Pre-Procedure History & Physical: HPI:  [name] is a 83 y.o. female here for further evaluation positive Cologuard.  Please note the patient has a history of colonic adenoma removed back in 2012 woefully overdue for follow-up.  Recently she had a Cologuard done which was positive.  She is here for surveillance colonoscopy.   Past Medical History:  Diagnosis Date   Allergy    takes Claritin daily   Arthritis    Complication of anesthesia    Constipation    takes Senokot daily   Depression    takes Lexapro daily   Family history of adverse reaction to anesthesia    sister has a hard time waking up   GERD (gastroesophageal reflux disease)    History of blood clots 1979   after C/S clot went to lung   History of bronchitis    3 yrs ago   History of migraine    Hypothyroidism    takes Synthroid daily   Joint pain    Joint swelling    Personal history of colonic polyps    benign   PONV (postoperative nausea and vomiting)    Rheumatic fever 1958   hospitalized for three months   Urinary leakage    takes Ditropan daily   Vitamin D deficiency    takes Vit D daily    Past Surgical History:  Procedure Laterality Date   APPENDECTOMY     incidental at time of C-section   BACK SURGERY  1986   cataract surgery Bilateral    CESAREAN SECTION  1979   CHOLECYSTECTOMY  1982   COLONOSCOPY  2004   Dr. Frankey Poot polyps, left sided diverticula. Path unavailable.    COLONOSCOPY  02/12/2011   Procedure: COLONOSCOPY;  Surgeon: Corbin Ade, MD;  [facility];  Service: Endoscopy;  Laterality: N/A;   ESOPHAGOGASTRODUODENOSCOPY  02/12/2011   Procedure: ESOPHAGOGASTRODUODENOSCOPY (EGD);  Surgeon: Corbin Ade, MD;  [facility];  Service: Endoscopy;  Laterality: N/A;   GASTRIC ROUX-EN-Y N/A 06/19/2015   Procedure: LAPAROSCOPIC ROUX-EN-Y GASTRIC BYPASS WITH UPPER  ENDOSCOPY;  Surgeon: Glenna Fellows, MD;  Location: [facility];  Service: General;  Laterality: N/A;   KNEE SURGERY  1999   left   TOTAL KNEE ARTHROPLASTY  10/2009   left   TOTAL KNEE ARTHROPLASTY Right 11/25/2016   Procedure: RIGHT TOTAL KNEE ARTHROPLASTY;  Surgeon: Kathryne Hitch, MD;  Location: [facility];  Service: Orthopedics;  Laterality: Right;   UPPER GI ENDOSCOPY  06/19/2015   Procedure: UPPER GI ENDOSCOPY;  Surgeon: Glenna Fellows, MD;  Location: [facility];  Service: General;;    Prior to Admission medications   Medication Sig Start Date End Date Taking? Authorizing Provider  acetaminophen (TYLENOL) 325 MG tablet Take 325 mg by mouth every 6 (six) hours as needed for mild pain or headache.   Yes [provider]  Ascorbic Acid (VITAMIN C) 500 MG CHEW Chew 500 mg by mouth 2 (two) times daily.   Yes [provider]  betamethasone valerate (VALISONE) 0.1 % cream  02/23/18  Yes [provider]  Calcium 600-400 MG-UNIT CHEW Chew 1 tablet by mouth 3 (three) times daily.   Yes [provider]  Calcium Polycarbophil (FIBERCON PO) Take 1 tablet by mouth at bedtime.   Yes [provider]  cholecalciferol (VITAMIN D) 1000 units tablet Take 1,000 Units by mouth  daily.   Yes [provider]  Cyanocobalamin (VITAMIN B-12) 2500 MCG SUBL Place 2,500 mcg under the tongue daily.    Yes [provider]  escitalopram (LEXAPRO) 20 MG tablet Take 20 mg by mouth daily.     Yes [provider]  levothyroxine (SYNTHROID) 137 MCG tablet Take 137 mcg by mouth daily before breakfast.   Yes [provider]  losartan (COZAAR) 50 MG tablet Take 50 mg by mouth daily.   Yes [provider]  Multiple Vitamin (MULTIVITAMIN WITH MINERALS) TABS tablet Take 1 tablet by mouth 2 (two) times daily.    Yes [provider]  omeprazole (PRILOSEC OTC) 20 MG tablet Take 20 mg by mouth daily. 12/03/20  Yes [provider]   Vibegron (GEMTESA) 75 MG TABS  11/20/22  Yes [provider]    Allergies as of 09/10/2023 - Review Complete 09/10/2023  Allergen Reaction Noted   No known allergies  11/24/2016    Family History  Problem Relation Age of Onset   Diabetes Other        siblings, mother   Lung disease Father        black lung, died young   Heart disease Brother    Colon cancer Neg Hx     Social History   Socioeconomic History   Marital status: Single    Spouse name: Not on file   Number of children: 1   Years of education: Not on file   Highest education level: Not on file  Occupational History    Employer: retired  Tobacco Use   Smoking status: Never   Smokeless tobacco: Never  Vaping Use   Vaping status: Never Used  Substance and Sexual Activity   Alcohol use: No   Drug use: No   Sexual activity: Not on file  Other Topics Concern   Not on file  Social History Narrative   Not on file   Social Drivers of Health   Financial Resource Strain: Not on file  Food Insecurity: Not on file  Transportation Needs: Not on file  Physical Activity: Not on file  Stress: Not on file  Social Connections: Not on file  Intimate Partner Violence: Not on file    Review of Systems: See HPI, otherwise negative ROS  Physical Exam: BP 138/71   Pulse 68   Temp 98 F (36.7 C) (Oral)   Resp 16   Ht 5\' 5"  (1.651 m)   Wt 90.7 kg   SpO2 97%   BMI 33.28 kg/m  General:   Alert,  Well-developed, well-nourished, pleasant and cooperative in NAD Neck:  Supple; no masses or thyromegaly. No significant cervical adenopathy. Lungs:  Clear throughout to auscultation.   No wheezes, crackles, or rhonchi. No acute distress. Heart:  Regular rate and rhythm; no murmurs, clicks, rubs,  or gallops. Abdomen: Non-distended, normal bowel sounds.  Soft and nontender without appreciable mass or hepatosplenomegaly.   Impression/Plan: 83 year old lady with a known history of colonic adenoma who is far overdue  for surveillance follow-up.  Recently had a Cologuard done which turned out to be positive.  She is here for a colonoscopy. Colonoscopy today per plan.  The risks, benefits, limitations, alternatives and imponderables have been reviewed with the patient. Questions have been answered. All parties are agreeable.      Notice: This dictation was prepared with Dragon dictation along with smaller phrase technology. Any transcriptional errors that result from this process are unintentional and may not be corrected  upon review.

## 2023-10-21 NOTE — Anesthesia Preprocedure Evaluation (Addendum)
 Anesthesia Evaluation  Patient identified by MRN, date of birth, ID band Patient awake    Reviewed: Allergy & Precautions, H&P , NPO status , Patient's Chart, lab work & pertinent test results, reviewed documented beta blocker date and time   History of Anesthesia Complications (+) PONV, Family history of anesthesia reaction and history of anesthetic complications  Airway Mallampati: II  TM Distance: >3 FB Neck ROM: full    Dental no notable dental hx. (+) Dental Advisory Given, Teeth Intact   Pulmonary neg pulmonary ROS   Pulmonary exam normal breath sounds clear to auscultation       Cardiovascular Exercise Tolerance: Good negative cardio ROS Normal cardiovascular exam Rhythm:regular Rate:Normal     Neuro/Psych  PSYCHIATRIC DISORDERS  Depression    negative neurological ROS     GI/Hepatic negative GI ROS, Neg liver ROS,GERD  ,,  Endo/Other  Hypothyroidism    Renal/GU negative Renal ROS  negative genitourinary   Musculoskeletal   Abdominal   Peds  Hematology negative hematology ROS (+)   Anesthesia Other Findings   Reproductive/Obstetrics negative OB ROS                             Anesthesia Physical Anesthesia Plan  ASA: 2  Anesthesia Plan: General   Post-op Pain Management: Minimal or no pain anticipated   Induction: Intravenous  PONV Risk Score and Plan: Propofol infusion  Airway Management Planned: Nasal Cannula and Natural Airway  Additional Equipment: None  Intra-op Plan:   Post-operative Plan:   Informed Consent: I have reviewed the patients History and Physical, chart, labs and discussed the procedure including the risks, benefits and alternatives for the proposed anesthesia with the patient or authorized representative who has indicated his/her understanding and acceptance.     Dental Advisory Given  Plan Discussed with: CRNA  Anesthesia Plan Comments:         Anesthesia Quick Evaluation

## 2023-10-21 NOTE — Discharge Instructions (Signed)
  Colonoscopy Discharge Instructions  Read the instructions outlined below and refer to this sheet in the next few weeks. These discharge instructions provide you with general information on caring for yourself after you leave the hospital. Your doctor may also give you specific instructions. While your treatment has been planned according to the most current medical practices available, unavoidable complications occasionally occur. If you have any problems or questions after discharge, call Dr. Jena Gauss at [phone]. ACTIVITY You may resume your regular activity, but move at a slower pace for the next 24 hours.  Take frequent rest periods for the next 24 hours.  Walking will help get rid of the air and reduce the bloated feeling in your belly (abdomen).  No driving for 24 hours (because of the medicine (anesthesia) used during the test).   Do not sign any important legal documents or operate any machinery for 24 hours (because of the anesthesia used during the test).  NUTRITION Drink plenty of fluids.  You may resume your normal diet as instructed by your doctor.  Begin with a light meal and progress to your normal diet. Heavy or fried foods are harder to digest and may make you feel sick to your stomach (nauseated).  Avoid alcoholic beverages for 24 hours or as instructed.  MEDICATIONS You may resume your normal medications unless your doctor tells you otherwise.  WHAT YOU CAN EXPECT TODAY Some feelings of bloating in the abdomen.  Passage of more gas than usual.  Spotting of blood in your stool or on the toilet paper.  IF YOU HAD POLYPS REMOVED DURING THE COLONOSCOPY: No aspirin products for 7 days or as instructed.  No alcohol for 7 days or as instructed.  Eat a soft diet for the next 24 hours.  FINDING OUT THE RESULTS OF YOUR TEST Not all test results are available during your visit. If your test results are not back during the visit, make an appointment with your caregiver to find out the  results. Do not assume everything is normal if you have not heard from your caregiver or the medical facility. It is important for you to follow up on all of your test results.  SEEK IMMEDIATE MEDICAL ATTENTION IF: You have more than a spotting of blood in your stool.  Your belly is swollen (abdominal distention).  You are nauseated or vomiting.  You have a temperature over 101.  You have abdominal pain or discomfort that is severe or gets worse throughout the day.     2 polyps found and removed  Colon polyp diverticulosis information provided   Further recommendations to follow pending review of pathology report

## 2023-10-22 ENCOUNTER — Encounter (HOSPITAL_COMMUNITY): Payer: Self-pay | Admitting: Internal Medicine

## 2023-10-23 LAB — SURGICAL PATHOLOGY

## 2023-10-24 ENCOUNTER — Encounter: Payer: Self-pay | Admitting: Internal Medicine

## 2023-12-09 DIAGNOSIS — R350 Frequency of micturition: Secondary | ICD-10-CM | POA: Diagnosis not present

## 2023-12-09 DIAGNOSIS — N3946 Mixed incontinence: Secondary | ICD-10-CM | POA: Diagnosis not present

## 2023-12-18 ENCOUNTER — Other Ambulatory Visit: Payer: Self-pay

## 2023-12-18 ENCOUNTER — Ambulatory Visit: Admitting: Sports Medicine

## 2023-12-18 ENCOUNTER — Encounter: Payer: Self-pay | Admitting: Sports Medicine

## 2023-12-18 DIAGNOSIS — M25511 Pain in right shoulder: Secondary | ICD-10-CM

## 2023-12-18 DIAGNOSIS — M19011 Primary osteoarthritis, right shoulder: Secondary | ICD-10-CM

## 2023-12-18 DIAGNOSIS — G8929 Other chronic pain: Secondary | ICD-10-CM

## 2023-12-18 DIAGNOSIS — M12811 Other specific arthropathies, not elsewhere classified, right shoulder: Secondary | ICD-10-CM

## 2023-12-18 DIAGNOSIS — M2041 Other hammer toe(s) (acquired), right foot: Secondary | ICD-10-CM | POA: Diagnosis not present

## 2023-12-18 DIAGNOSIS — M2042 Other hammer toe(s) (acquired), left foot: Secondary | ICD-10-CM | POA: Diagnosis not present

## 2023-12-18 MED ORDER — METHYLPREDNISOLONE ACETATE 40 MG/ML IJ SUSP
80.0000 mg | INTRAMUSCULAR | Status: AC | PRN
Start: 2023-12-18 — End: 2023-12-18
  Administered 2023-12-18: 80 mg via INTRA_ARTICULAR

## 2023-12-18 MED ORDER — LIDOCAINE HCL 1 % IJ SOLN
2.0000 mL | INTRAMUSCULAR | Status: AC | PRN
Start: 2023-12-18 — End: 2023-12-18
  Administered 2023-12-18: 2 mL

## 2023-12-18 MED ORDER — BUPIVACAINE HCL 0.25 % IJ SOLN
2.0000 mL | INTRAMUSCULAR | Status: AC | PRN
Start: 2023-12-18 — End: 2023-12-18
  Administered 2023-12-18: 2 mL via INTRA_ARTICULAR

## 2023-12-18 NOTE — Progress Notes (Signed)
 [name] - 82 y.o. female MRN [id number]  Date of birth: [birth year]  Office Visit Note: Visit Date: 12/18/2023 PCP: Omie Bickers, MD Referred by: Omie Bickers, MD  Subjective: Chief Complaint  Patient presents with   Right Shoulder - Pain   HPI: [name] is a pleasant 83 y.o. female who presents today for acute on chronic right shoulder pain.  Back on 08/17/2023 we did perform an ultrasound-guided right glenohumeral injection which gave her rather excellent relief.  This lasted for a number of months but here over the last few weeks to months her pain has become reexacerbated and becoming progressively worse.  After we did the injection, she did not need any medication because her pain was better.  Has used a few doses of Tylenol  as needed.  She denies any new falls or injuries.  She does have some pain in the feet/midfoot which is chronic. Notable hammertoe deformity. She calls these "[place] feet." Does have insoles/orthotics and has been to foot store with metatarsal padding. Wearing Birkenstock like shoe today.  Pertinent ROS were reviewed with the patient and found to be negative unless otherwise specified above in HPI.   Assessment & Plan: Visit Diagnoses:  1. Primary osteoarthritis of right shoulder   2. Chronic right shoulder pain   3. Hammer toes of both feet   4. Rotator cuff arthropathy of right shoulder    Plan: Impression is chronic right shoulder pain with exacerbation of underlying right shoulder osteoarthritis as well as rotator cuff tendinopathy.  She received excellent relief of her pain from prior ultrasound-guided injection back in January.  Given her pain has been reexacerbated over the last few weeks, we did proceed with repeat ultrasound-guided glenohumeral joint injection.  Patient tolerated well.  Advised on postinjection protocol.  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  She may use Tylenol  and or ice for any postinjection pain.  Foot pain, has has arthritis, she does have evidence of this in the midfoot as well as bilateral hammertoe deformity. Discussed continuing supportive shoewear and orthotics to support the arch with metatarsal padding too offload her hammeoes/metatarsalgia.  In terms of the shoulder, we did discuss weekly injections to continue to provide her relief that need to be at least 3 months apart.  She will call or make an appointment as needed.  Follow-up: Return in about 3 months (around 03/19/2024), or if symptoms worsen or fail to improve.   Meds & Orders: No orders of the defined types were placed in this encounter.   Orders Placed This Encounter  Procedures   Large Joint Inj   US  Guided Needle Placement - No Linked Charges     Procedures: Large Joint Inj: R glenohumeral on 12/18/2023 1:28 PM Indications: pain Details: 22 G 3.5 in needle, ultrasound-guided posterior approach Medications: 2 mL lidocaine  1 %; 2 mL bupivacaine  0.25 %; 80 mg methylPREDNISolone  acetate 40 MG/ML Outcome: tolerated well, no immediate complications  US -guided glenohumeral joint injection, Right shoulder After discussion on risks/benefits/indications, informed verbal consent was obtained. A timeout was then performed. The patient was positioned lying lateral recumbent on examination table. The patient's shoulder was prepped with betadine and multiple alcohol swabs and utilizing ultrasound guidance, the patient's glenohumeral joint was identified on ultrasound. Using ultrasound guidance a 22-gauge, 3.5 inch needle with a mixture of 2:2:2 cc's  lidocaine :bupivicaine:depomedrol was directed from a lateral to medial direction via in-plane technique into the glenohumeral joint with visualization of appropriate spread of injectate into the joint. Patient tolerated the procedure well without immediate complications.       Procedure, treatment alternatives, risks and benefits explained, specific risks discussed. Consent was given by the patient. Immediately prior to procedure a time out was called to verify the correct patient, procedure, equipment, support staff and site/side marked as required. Patient was prepped and draped in the usual sterile fashion.          Clinical History: No specialty comments available.  She reports that she has never smoked. She has never used smokeless tobacco. No results for input(s): "HGBA1C", "LABURIC" in the last 8760 hours.  Objective:    Physical Exam  Gen: Well-appearing, in no acute distress; non-toxic CV: Well-perfused. Warm.  Resp: Breathing unlabored on room air; no wheezing. Psych: Fluid speech in conversation; appropriate affect; normal thought process  Ortho Exam - Right shoulder: There is a trace effusion of the right shoulder. There is no gross restriction with ROM but mild grating through end-range. No redness or warmth. Preserved strength with IR/ER.  - Bilateral feet: Mild bony bossing of the midfoot left greater than right. There is notable hammertoe deformities of 2-3 of both the left and the right foot.  Chronic venous stasis changes present.  Imaging:  *Independent review of three-view right shoulder x-ray from 04/26/2023 as well as right shoulder MRI from 06/13/2023 was performed by myself today.  Humeral head is well-seated within the glenoid.  This is slightly depressed with evidence of a moderate reactive effusion on her MRI.  There is associated bone marrow edema within the anterior glenoid.  There is moderate global rotator cuff tendinopathy with likely some undersurface tearing in the supraspinatus and anterior infraspinatus insertion, there is no tendon retraction however.  At least moderate glenoid cartilage loss with OA.  Narrative & Impression  CLINICAL DATA:  Right shoulder pain status post fall 04/21/2023   EXAM: MRI OF THE RIGHT  SHOULDER WITHOUT CONTRAST   TECHNIQUE: Multiplanar, multisequence MR imaging of the shoulder was performed. No intravenous contrast was administered.   COMPARISON:  None Available.   FINDINGS: Rotator cuff: Moderate supraspinatus tendinosis with a partial-thickness bursal surface tear at the insertion measuring 9 mm AP, 5 mm medial-lateral and involving 50% of the footprint. Moderate infraspinatus tendinosis. Moderate subscapularis tendinosis. Teres minor tendon is intact.   Muscles: No muscle atrophy or edema. No intramuscular fluid collection or hematoma.   Biceps Long Head: Intraarticular and extraarticular portions of the biceps tendon are intact.   Acromioclavicular Joint: Moderate arthropathy of the acromioclavicular joint. Small amount of subacromial/subdeltoid bursal fluid with synovitis.   Glenohumeral Joint: Moderate joint effusion with synovitis. Moderate partial-thickness cartilage loss of the glenohumeral joint.   Labrum: Superior labral degeneration.   Bones: Bone marrow edema in the anterior inferior glenoid with cortical irregularity concerning for a subacute nondisplaced fracture.   Other: No fluid collection or hematoma.   IMPRESSION: 1. Bone marrow edema in the anterior inferior glenoid with cortical irregularity concerning for a subacute nondisplaced fracture. 2. Moderate supraspinatus tendinosis with a partial-thickness bursal surface tear at the insertion measuring 9 mm AP, 5 mm medial-lateral and involving 50% of the footprint. 3. Moderate infraspinatus and subscapularis tendinosis. 4. Mild subacromial/subdeltoid bursitis. 5. Moderate joint effusion with synovitis. 6. Moderate osteoarthritis of the glenohumeral joint.     Electronically Signed   By: Onnie Bilis  M.D.   On: 06/27/2023 07:47    Past Medical/Family/Surgical/Social History: Medications & Allergies reviewed per EMR, new medications updated. Patient Active Problem List    Diagnosis Date Noted   Thyroid  dysfunction 02/25/2019   Overactive bladder 02/25/2019   Lichen sclerosus of female genitalia 02/25/2019   Status post total right knee replacement 11/25/2016   Unilateral primary osteoarthritis, right knee 10/22/2016   Trigger thumb, left thumb 10/22/2016   Morbid obesity (HCC) 06/19/2015   GERD (gastroesophageal reflux disease) 01/20/2011   Colon polyp 01/20/2011   Hemorrhoids 01/20/2011   Past Medical History:  Diagnosis Date   Allergy    takes Claritin  daily   Arthritis    Complication of anesthesia    Constipation    takes Senokot daily   Depression    takes Lexapro  daily   Family history of adverse reaction to anesthesia    sister has a hard time waking up   GERD (gastroesophageal reflux disease)    History of blood clots 1979   after C/S clot went to lung   History of bronchitis    3 yrs ago   History of migraine    Hypothyroidism    takes Synthroid  daily   Joint pain    Joint swelling    Personal history of colonic polyps    benign   PONV (postoperative nausea and vomiting)    Rheumatic fever 1958   hospitalized for three months   Urinary leakage    takes Ditropan  daily   Vitamin D  deficiency    takes Vit D daily   Family History  Problem Relation Age of Onset   Diabetes Other        siblings, mother   Lung disease Father        black lung, died young   Heart disease Brother    Colon cancer Neg Hx    Past Surgical History:  Procedure Laterality Date   APPENDECTOMY     incidental at time of C-section   BACK SURGERY  1986   cataract surgery Bilateral    CESAREAN SECTION  1979   CHOLECYSTECTOMY  1982   COLONOSCOPY  2004   Dr. Merrill Abide polyps, left sided diverticula. Path unavailable.    COLONOSCOPY  02/12/2011   Procedure: COLONOSCOPY;  Surgeon: Suzette Espy, MD;  Location: [facility];  Service: Endoscopy;  Laterality: N/A;   COLONOSCOPY WITH PROPOFOL  N/A 10/21/2023   Procedure: COLONOSCOPY WITH PROPOFOL ;   Surgeon: Suzette Espy, MD;  Location: [facility];  Service: Endoscopy;  Laterality: N/A;  1:15pm, asa 3   ESOPHAGOGASTRODUODENOSCOPY  02/12/2011   Procedure: ESOPHAGOGASTRODUODENOSCOPY (EGD);  Surgeon: Suzette Espy, MD;  Location: [facility];  Service: Endoscopy;  Laterality: N/A;   GASTRIC ROUX-EN-Y N/A 06/19/2015   Procedure: LAPAROSCOPIC ROUX-EN-Y GASTRIC BYPASS WITH UPPER ENDOSCOPY;  Surgeon: Ayesha Lente, MD;  Location: [facility];  Service: General;  Laterality: N/A;   KNEE SURGERY  1999   left   POLYPECTOMY  10/21/2023   Procedure: POLYPECTOMY;  Surgeon: Suzette Espy, MD;  Location: [facility];  Service: Endoscopy;;   TOTAL KNEE ARTHROPLASTY  10/2009   left   TOTAL KNEE ARTHROPLASTY Right 11/25/2016   Procedure: RIGHT TOTAL KNEE ARTHROPLASTY;  Surgeon: Arnie Lao, MD;  Location: [facility];  Service: Orthopedics;  Laterality: Right;   UPPER GI ENDOSCOPY  06/19/2015   Procedure: UPPER GI ENDOSCOPY;  Surgeon: Ayesha Lente, MD;  Location: [facility];  Service: General;;   Social  History   Occupational History    Employer: retired  Tobacco Use   Smoking status: Never   Smokeless tobacco: Never  Vaping Use   Vaping status: Never Used  Substance and Sexual Activity   Alcohol use: No   Drug use: No   Sexual activity: Not on file

## 2023-12-18 NOTE — Progress Notes (Signed)
 Patient got great relief from her injection in January. She says that her shoulder began bothering her again about 1 month ago and has gotten progressively worse in the time since then. She denies any new injuries or falls. She says her pain is the same now as it was prior to her injection.

## 2024-02-17 DIAGNOSIS — R809 Proteinuria, unspecified: Secondary | ICD-10-CM | POA: Diagnosis not present

## 2024-02-17 DIAGNOSIS — E039 Hypothyroidism, unspecified: Secondary | ICD-10-CM | POA: Diagnosis not present

## 2024-02-17 DIAGNOSIS — R7303 Prediabetes: Secondary | ICD-10-CM | POA: Diagnosis not present

## 2024-02-17 DIAGNOSIS — E782 Mixed hyperlipidemia: Secondary | ICD-10-CM | POA: Diagnosis not present

## 2024-02-19 ENCOUNTER — Encounter: Payer: Self-pay | Admitting: Advanced Practice Midwife

## 2024-02-23 DIAGNOSIS — M810 Age-related osteoporosis without current pathological fracture: Secondary | ICD-10-CM | POA: Diagnosis not present

## 2024-02-23 DIAGNOSIS — I10 Essential (primary) hypertension: Secondary | ICD-10-CM | POA: Diagnosis not present

## 2024-02-23 DIAGNOSIS — E782 Mixed hyperlipidemia: Secondary | ICD-10-CM | POA: Diagnosis not present

## 2024-02-23 DIAGNOSIS — R7303 Prediabetes: Secondary | ICD-10-CM | POA: Diagnosis not present

## 2024-02-23 DIAGNOSIS — R296 Repeated falls: Secondary | ICD-10-CM | POA: Diagnosis not present

## 2024-02-23 DIAGNOSIS — F331 Major depressive disorder, recurrent, moderate: Secondary | ICD-10-CM | POA: Diagnosis not present

## 2024-02-23 DIAGNOSIS — E039 Hypothyroidism, unspecified: Secondary | ICD-10-CM | POA: Diagnosis not present

## 2024-02-23 DIAGNOSIS — M19211 Secondary osteoarthritis, right shoulder: Secondary | ICD-10-CM | POA: Diagnosis not present

## 2024-02-23 DIAGNOSIS — R748 Abnormal levels of other serum enzymes: Secondary | ICD-10-CM | POA: Diagnosis not present

## 2024-02-23 DIAGNOSIS — G8929 Other chronic pain: Secondary | ICD-10-CM | POA: Diagnosis not present

## 2024-02-23 DIAGNOSIS — R809 Proteinuria, unspecified: Secondary | ICD-10-CM | POA: Diagnosis not present

## 2024-02-23 DIAGNOSIS — M79672 Pain in left foot: Secondary | ICD-10-CM | POA: Diagnosis not present

## 2024-03-02 ENCOUNTER — Other Ambulatory Visit: Payer: Self-pay

## 2024-03-02 ENCOUNTER — Telehealth: Payer: Self-pay

## 2024-03-02 DIAGNOSIS — M810 Age-related osteoporosis without current pathological fracture: Secondary | ICD-10-CM | POA: Insufficient documentation

## 2024-03-02 NOTE — Telephone Encounter (Signed)
 Auth Submission: NO AUTH NEEDED Site of care: Site of care: [facility] Payer: medicare a/b, [employer] OTHER  Medication & CPT/J Code(s) submitted: Prolia (Denosumab) [id number] Diagnosis Code:  Route of submission (phone, fax, portal): portal Phone # Fax # Auth type: Buy/Bill HB Units/visits requested: 60mg , q48months x2 doses Reference number: [id number] Approval from: 03/02/24 to 08/03/24

## 2024-03-09 ENCOUNTER — Encounter: Attending: Internal Medicine | Admitting: *Deleted

## 2024-03-09 VITALS — BP 146/80 | HR 94 | Temp 97.7°F | Resp 16

## 2024-03-09 DIAGNOSIS — M810 Age-related osteoporosis without current pathological fracture: Secondary | ICD-10-CM | POA: Diagnosis not present

## 2024-03-09 MED ORDER — DENOSUMAB 60 MG/ML ~~LOC~~ SOSY
60.0000 mg | PREFILLED_SYRINGE | Freq: Once | SUBCUTANEOUS | Status: AC
  Administered 2024-03-09: 60 mg via SUBCUTANEOUS

## 2024-03-09 NOTE — Progress Notes (Signed)
 Diagnosis: Osteoporosis  Provider:  Dwana Melena MD  Procedure: Injection  Prolia (Denosumab), Dose: 60 mg, Site: subcutaneous, Number of injections: 1  Injection Site(s): Left arm  Post Care: Observation period completed  Discharge: Condition: Good, Destination: Home . AVS Provided  Performed by:  Daleen Squibb, RN

## 2024-03-15 DIAGNOSIS — N3946 Mixed incontinence: Secondary | ICD-10-CM | POA: Diagnosis not present

## 2024-03-15 DIAGNOSIS — R350 Frequency of micturition: Secondary | ICD-10-CM | POA: Diagnosis not present

## 2024-03-22 ENCOUNTER — Ambulatory Visit (INDEPENDENT_AMBULATORY_CARE_PROVIDER_SITE_OTHER): Admitting: Sports Medicine

## 2024-03-22 ENCOUNTER — Encounter: Payer: Self-pay | Admitting: Sports Medicine

## 2024-03-22 ENCOUNTER — Other Ambulatory Visit: Payer: Self-pay

## 2024-03-22 DIAGNOSIS — G8929 Other chronic pain: Secondary | ICD-10-CM | POA: Diagnosis not present

## 2024-03-22 DIAGNOSIS — M25511 Pain in right shoulder: Secondary | ICD-10-CM

## 2024-03-22 DIAGNOSIS — M19011 Primary osteoarthritis, right shoulder: Secondary | ICD-10-CM | POA: Diagnosis not present

## 2024-03-22 DIAGNOSIS — M12811 Other specific arthropathies, not elsewhere classified, right shoulder: Secondary | ICD-10-CM

## 2024-03-22 MED ORDER — BUPIVACAINE HCL 0.25 % IJ SOLN
2.0000 mL | INTRAMUSCULAR | Status: AC | PRN
  Administered 2024-03-22: 2 mL via INTRA_ARTICULAR

## 2024-03-22 MED ORDER — LIDOCAINE HCL 1 % IJ SOLN
2.0000 mL | INTRAMUSCULAR | Status: AC | PRN
  Administered 2024-03-22: 2 mL

## 2024-03-22 MED ORDER — METHYLPREDNISOLONE ACETATE 40 MG/ML IJ SUSP
80.0000 mg | INTRAMUSCULAR | Status: AC | PRN
  Administered 2024-03-22: 80 mg via INTRA_ARTICULAR

## 2024-03-22 NOTE — Progress Notes (Signed)
 [name] - 83 y.o. female MRN [id number]  Date of birth: [birth year]  Office Visit Note: Visit Date: 03/22/2024 PCP: Shona Norleen PEDLAR, MD Referred by: Shona Norleen PEDLAR, MD  Subjective: Chief Complaint  Patient presents with   Right Shoulder - Pain   HPI: [name] is a pleasant 83 y.o. female who presents today for acute on chronic right shoulder pain.  We last saw [facility] back in early May and provided ultrasound-guided injection into the shoulder joint which gave her good relief for a few months.  Here over the last few weeks her pain has started to return.  She is interested in considering an additional injection, given that she is having pain at nighttime with sleeping.  It is still feeling better than it did previously.  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   Pertinent ROS were reviewed with the patient and found to be negative unless otherwise specified above in HPI.   Assessment & Plan: Visit Diagnoses:  1. Primary osteoarthritis of right shoulder   2. Chronic right shoulder pain   3. Rotator cuff arthropathy of right shoulder    Plan: Impression is chronic right shoulder pain with current exacerbation given her osteoarthritis and concomitant rotator cuff arthropathy.  Through shared decision making, we did proceed with ultrasound-guided right intra-articular shoulder injection, patient tolerated well.  Advised on postinjection protocol.  May use Tylenol , ice or any over-the-counter anti-inflammatories as needed.  We discussed the role for infrequent injections as needed.  She could consider a home exercise protocol for the shoulder in the future if not getting lasting relief, she will follow-up as needed.  Follow-up: Return if symptoms worsen or fail to improve.   Meds & Orders: No orders of the defined types were placed in this encounter.   Orders Placed This Encounter  Procedures   Large Joint Inj   US  Guided Needle Placement - No  Linked Charges     Procedures: Large Joint Inj: R glenohumeral on 03/22/2024 4:34 PM Indications: pain Details: 22 G 3.5 in needle, ultrasound-guided posterior approach Medications: 2 mL lidocaine  1 %; 2 mL bupivacaine  0.25 %; 80 mg methylPREDNISolone  acetate 40 MG/ML Outcome: tolerated well, no immediate complications  US -guided glenohumeral joint injection, right shoulder After discussion on risks/benefits/indications, informed verbal consent was obtained. A timeout was then performed. The patient was positioned lying lateral recumbent on examination table. The patient's shoulder was prepped with betadine and multiple alcohol swabs and utilizing ultrasound guidance, the patient's glenohumeral joint was identified on ultrasound. Using ultrasound guidance a 22-gauge, 3.5 inch needle with a mixture of 2:2:2 cc's lidocaine :bupivicaine:depomedrol was directed from a lateral to medial direction via in-plane technique into the glenohumeral joint with visualization of appropriate spread of injectate into the joint. Patient tolerated the procedure well without immediate complications.      Procedure, treatment alternatives, risks and benefits explained, specific risks discussed. Consent was given by the patient. Immediately prior to procedure a time out was called to verify the correct patient, procedure, equipment, support staff and site/side marked as required. Patient was prepped and draped in the usual sterile fashion.          Clinical History: No specialty comments available.  She reports that she has never smoked. She has never used smokeless tobacco. No results for input(s): HGBA1C, LABURIC in the last 8760 hours.  Objective:    Physical Exam  Gen: Well-appearing, in no acute distress; non-toxic CV:  Well-perfused. Warm.  Resp: Breathing unlabored on room air; no wheezing. Psych: Fluid speech in conversation; appropriate affect; normal thought process  Ortho Exam - Right  shoulder: There is a small joint effusion noted of the shoulder.  There is some crepitus taking it through end range of motion.  Imaging: No results found.  Past Medical/Family/Surgical/Social History: Medications & Allergies reviewed per EMR, new medications updated. Patient Active Problem List   Diagnosis Date Noted   Age-related osteoporosis without current pathological fracture 03/02/2024   Thyroid  dysfunction 02/25/2019   Overactive bladder 02/25/2019   Lichen sclerosus of female genitalia 02/25/2019   Status post total right knee replacement 11/25/2016   Unilateral primary osteoarthritis, right knee 10/22/2016   Trigger thumb, left thumb 10/22/2016   Morbid obesity (HCC) 06/19/2015   GERD (gastroesophageal reflux disease) 01/20/2011   Colon polyp 01/20/2011   Hemorrhoids 01/20/2011   Past Medical History:  Diagnosis Date   Allergy    takes Claritin  daily   Arthritis    Complication of anesthesia    Constipation    takes Senokot daily   Depression    takes Lexapro  daily   Family history of adverse reaction to anesthesia    sister has a hard time waking up   GERD (gastroesophageal reflux disease)    History of blood clots 1979   after C/S clot went to lung   History of bronchitis    3 yrs ago   History of migraine    Hypothyroidism    takes Synthroid  daily   Joint pain    Joint swelling    Personal history of colonic polyps    benign   PONV (postoperative nausea and vomiting)    Rheumatic fever 1958   hospitalized for three months   Urinary leakage    takes Ditropan  daily   Vitamin D  deficiency    takes Vit D daily   Family History  Problem Relation Age of Onset   Diabetes Other        siblings, mother   Lung disease Father        black lung, died young   Heart disease Brother    Colon cancer Neg Hx    Past Surgical History:  Procedure Laterality Date   APPENDECTOMY     incidental at time of C-section   BACK SURGERY  1986   cataract surgery  Bilateral    CESAREAN SECTION  1979   CHOLECYSTECTOMY  1982   COLONOSCOPY  2004   Dr. Randol polyps, left sided diverticula. Path unavailable.    COLONOSCOPY  02/12/2011   Procedure: COLONOSCOPY;  Surgeon: Lamar CHRISTELLA Hollingshead, MD;  Location: [facility];  Service: Endoscopy;  Laterality: N/A;   COLONOSCOPY WITH PROPOFOL  N/A 10/21/2023   Procedure: COLONOSCOPY WITH PROPOFOL ;  Surgeon: Hollingshead Lamar CHRISTELLA, MD;  Location: [facility];  Service: Endoscopy;  Laterality: N/A;  1:15pm, asa 3   ESOPHAGOGASTRODUODENOSCOPY  02/12/2011   Procedure: ESOPHAGOGASTRODUODENOSCOPY (EGD);  Surgeon: Lamar CHRISTELLA Hollingshead, MD;  Location: [facility];  Service: Endoscopy;  Laterality: N/A;   GASTRIC ROUX-EN-Y N/A 06/19/2015   Procedure: LAPAROSCOPIC ROUX-EN-Y GASTRIC BYPASS WITH UPPER ENDOSCOPY;  Surgeon: Morene Olives, MD;  Location: [facility];  Service: General;  Laterality: N/A;   KNEE SURGERY  1999   left   POLYPECTOMY  10/21/2023   Procedure: POLYPECTOMY;  Surgeon: Hollingshead Lamar CHRISTELLA, MD;  Location: [facility];  Service: Endoscopy;;   TOTAL KNEE ARTHROPLASTY  10/2009   left   TOTAL  KNEE ARTHROPLASTY Right 11/25/2016   Procedure: RIGHT TOTAL KNEE ARTHROPLASTY;  Surgeon: Lonni CINDERELLA Poli, MD;  [facility];  Service: Orthopedics;  Laterality: Right;   UPPER GI ENDOSCOPY  06/19/2015   Procedure: UPPER GI ENDOSCOPY;  Surgeon: Morene Olives, MD;  Location: [facility];  Service: General;;   Social History   Occupational History    Employer: retired  Tobacco Use   Smoking status: Never   Smokeless tobacco: Never  Vaping Use   Vaping status: Never Used  Substance and Sexual Activity   Alcohol use: No   Drug use: No   Sexual activity: Not on file

## 2024-03-22 NOTE — Progress Notes (Signed)
 Patient says that she got a couple of months of good relief from the injection. She is here today for repeat injection for the right shoulder. She says that her pain has returned gradually, and does not feel as bad as it did prior to the injection, but she would like to keep it from getting that way.

## 2024-05-19 DIAGNOSIS — Z23 Encounter for immunization: Secondary | ICD-10-CM | POA: Diagnosis not present

## 2024-06-06 ENCOUNTER — Encounter: Payer: Self-pay | Admitting: Radiology

## 2024-06-07 DIAGNOSIS — Z1231 Encounter for screening mammogram for malignant neoplasm of breast: Secondary | ICD-10-CM | POA: Diagnosis not present

## 2024-06-07 DIAGNOSIS — N3281 Overactive bladder: Secondary | ICD-10-CM | POA: Diagnosis not present

## 2024-06-07 DIAGNOSIS — Z6834 Body mass index (BMI) 34.0-34.9, adult: Secondary | ICD-10-CM | POA: Diagnosis not present

## 2024-06-07 DIAGNOSIS — L900 Lichen sclerosus et atrophicus: Secondary | ICD-10-CM | POA: Diagnosis not present

## 2024-06-07 DIAGNOSIS — N952 Postmenopausal atrophic vaginitis: Secondary | ICD-10-CM | POA: Diagnosis not present

## 2024-06-07 DIAGNOSIS — Z01419 Encounter for gynecological examination (general) (routine) without abnormal findings: Secondary | ICD-10-CM | POA: Diagnosis not present

## 2024-06-15 DIAGNOSIS — R350 Frequency of micturition: Secondary | ICD-10-CM | POA: Diagnosis not present

## 2024-06-15 DIAGNOSIS — R351 Nocturia: Secondary | ICD-10-CM | POA: Diagnosis not present

## 2024-06-15 DIAGNOSIS — N3281 Overactive bladder: Secondary | ICD-10-CM | POA: Diagnosis not present

## 2024-07-04 DIAGNOSIS — H43393 Other vitreous opacities, bilateral: Secondary | ICD-10-CM | POA: Diagnosis not present

## 2024-08-25 ENCOUNTER — Other Ambulatory Visit (HOSPITAL_COMMUNITY): Payer: Self-pay | Admitting: Internal Medicine

## 2024-08-25 NOTE — Progress Notes (Signed)
 [facility]  on 08/19/2024 wnl (attached to Prolia  referral)  Sherry Pennant, PharmD, MPH, BCPS, CPP Clinical Pharmacist

## 2024-08-26 ENCOUNTER — Telehealth: Payer: Self-pay

## 2024-08-26 NOTE — Telephone Encounter (Signed)
 Auth Submission: NO AUTH NEEDED Site of care: Site of care: [facility] Payer: medicare a/b, uhc Medication & CPT/J Code(s) submitted: Prolia  (Denosumab ) [id number] Diagnosis Code:  Route of submission (phone, fax, portal): portal Phone # Fax # Auth type: Buy/Bill HB Units/visits requested: 60mg , q6months x 2 doses Reference number: [id number] Approval from: 08/26/24 to 08/03/25

## 2024-09-12 ENCOUNTER — Ambulatory Visit
